# Patient Record
Sex: Male | Born: 1970
Health system: Southern US, Community
[De-identification: ages and names within clinical notes are randomized; demographics above are authoritative.]

## PROBLEM LIST (undated history)

## (undated) DIAGNOSIS — E66811 Obesity, class 1: Secondary | ICD-10-CM

## (undated) DIAGNOSIS — N529 Male erectile dysfunction, unspecified: Secondary | ICD-10-CM

## (undated) DIAGNOSIS — Z9989 Dependence on other enabling machines and devices: Secondary | ICD-10-CM

## (undated) DIAGNOSIS — G8929 Other chronic pain: Secondary | ICD-10-CM

## (undated) DIAGNOSIS — M109 Gout, unspecified: Secondary | ICD-10-CM

## (undated) DIAGNOSIS — J301 Allergic rhinitis due to pollen: Secondary | ICD-10-CM

## (undated) DIAGNOSIS — M1990 Unspecified osteoarthritis, unspecified site: Secondary | ICD-10-CM

## (undated) DIAGNOSIS — I2510 Atherosclerotic heart disease of native coronary artery without angina pectoris: Secondary | ICD-10-CM

## (undated) DIAGNOSIS — G4730 Sleep apnea, unspecified: Secondary | ICD-10-CM

## (undated) DIAGNOSIS — I129 Hypertensive chronic kidney disease with stage 1 through stage 4 chronic kidney disease, or unspecified chronic kidney disease: Secondary | ICD-10-CM

## (undated) DIAGNOSIS — R42 Dizziness and giddiness: Secondary | ICD-10-CM

## (undated) DIAGNOSIS — G4733 Obstructive sleep apnea (adult) (pediatric): Secondary | ICD-10-CM

## (undated) DIAGNOSIS — E669 Obesity, unspecified: Secondary | ICD-10-CM

## (undated) DIAGNOSIS — I10 Essential (primary) hypertension: Secondary | ICD-10-CM

## (undated) DIAGNOSIS — I219 Acute myocardial infarction, unspecified: Secondary | ICD-10-CM

## (undated) DIAGNOSIS — E785 Hyperlipidemia, unspecified: Secondary | ICD-10-CM

## (undated) DIAGNOSIS — M25512 Pain in left shoulder: Secondary | ICD-10-CM

## (undated) HISTORY — PX: HERNIA REPAIR: SHX51

## (undated) HISTORY — DX: Hypertensive chronic kidney disease with stage 1 through stage 4 chronic kidney disease, or unspecified chronic kidney disease: I12.9

## (undated) HISTORY — PX: NASAL SINUS SURGERY: SHX719

## (undated) HISTORY — DX: Gout, unspecified: M10.9

## (undated) HISTORY — DX: Other chronic pain: G89.29

## (undated) HISTORY — DX: Hyperlipidemia, unspecified: E78.5

## (undated) HISTORY — DX: Male erectile dysfunction, unspecified: N52.9

## (undated) HISTORY — DX: Dependence on other enabling machines and devices: Z99.89

## (undated) HISTORY — DX: Obesity, class 1: E66.811

## (undated) HISTORY — DX: Allergic rhinitis due to pollen: J30.1

## (undated) HISTORY — DX: Pain in left shoulder: M25.512

## (undated) HISTORY — DX: Obstructive sleep apnea (adult) (pediatric): G47.33

## (undated) HISTORY — DX: Obesity, unspecified: E66.9

## (undated) HISTORY — DX: Dizziness and giddiness: R42

---

## 2004-01-27 ENCOUNTER — Encounter: Admission: RE | Admit: 2004-01-27 | Discharge: 2004-01-27 | Payer: Self-pay | Admitting: Family Medicine

## 2005-01-12 ENCOUNTER — Encounter: Admission: RE | Admit: 2005-01-12 | Discharge: 2005-01-12 | Payer: Self-pay | Admitting: Sports Medicine

## 2006-04-06 ENCOUNTER — Encounter: Admission: RE | Admit: 2006-04-06 | Discharge: 2006-04-06 | Payer: Self-pay | Admitting: Family Medicine

## 2009-03-13 HISTORY — PX: CORONARY STENT PLACEMENT: SHX1402

## 2009-11-14 DIAGNOSIS — I214 Non-ST elevation (NSTEMI) myocardial infarction: Secondary | ICD-10-CM

## 2009-11-14 HISTORY — DX: Non-ST elevation (NSTEMI) myocardial infarction: I21.4

## 2009-11-15 ENCOUNTER — Inpatient Hospital Stay (HOSPITAL_COMMUNITY): Admission: EM | Admit: 2009-11-15 | Discharge: 2009-11-18 | Payer: Self-pay | Admitting: Emergency Medicine

## 2009-11-15 ENCOUNTER — Ambulatory Visit: Payer: Self-pay | Admitting: Cardiovascular Disease

## 2009-11-15 DIAGNOSIS — I2510 Atherosclerotic heart disease of native coronary artery without angina pectoris: Secondary | ICD-10-CM

## 2009-11-15 HISTORY — PX: CORONARY STENT PLACEMENT: SHX1402

## 2009-11-15 HISTORY — PX: LEFT HEART CATH AND CORONARY ANGIOGRAPHY: CATH118249

## 2009-11-15 HISTORY — DX: Atherosclerotic heart disease of native coronary artery without angina pectoris: I25.10

## 2009-11-30 DIAGNOSIS — I2129 ST elevation (STEMI) myocardial infarction involving other sites: Secondary | ICD-10-CM | POA: Insufficient documentation

## 2009-12-30 ENCOUNTER — Encounter (HOSPITAL_COMMUNITY)
Admission: RE | Admit: 2009-12-30 | Discharge: 2010-02-18 | Payer: Self-pay | Source: Home / Self Care | Attending: Cardiology | Admitting: Cardiology

## 2010-05-26 LAB — HEMOGLOBIN A1C
Hgb A1c MFr Bld: 4 % — ABNORMAL LOW (ref <5.7)
Hgb A1c MFr Bld: 4.1 % — ABNORMAL LOW (ref <5.7)
Mean Plasma Glucose: 68 mg/dL (ref <117)
Mean Plasma Glucose: 71 mg/dL (ref <117)

## 2010-05-26 LAB — PLATELET COUNT: Platelets: 278 10*3/uL (ref 150–400)

## 2010-05-26 LAB — CBC
HCT: 32.4 % — ABNORMAL LOW (ref 39.0–52.0)
HCT: 34 % — ABNORMAL LOW (ref 39.0–52.0)
HCT: 34.5 % — ABNORMAL LOW (ref 39.0–52.0)
Hemoglobin: 11.7 g/dL — ABNORMAL LOW (ref 13.0–17.0)
Hemoglobin: 12.4 g/dL — ABNORMAL LOW (ref 13.0–17.0)
Hemoglobin: 12.5 g/dL — ABNORMAL LOW (ref 13.0–17.0)
MCH: 29.5 pg (ref 26.0–34.0)
MCH: 29.6 pg (ref 26.0–34.0)
MCH: 29.7 pg (ref 26.0–34.0)
MCHC: 36.1 g/dL — ABNORMAL HIGH (ref 30.0–36.0)
MCHC: 36.2 g/dL — ABNORMAL HIGH (ref 30.0–36.0)
MCHC: 36.5 g/dL — ABNORMAL HIGH (ref 30.0–36.0)
MCV: 81.3 fL (ref 78.0–100.0)
MCV: 81.4 fL (ref 78.0–100.0)
MCV: 82 fL (ref 78.0–100.0)
Platelets: 258 10*3/uL (ref 150–400)
Platelets: 288 10*3/uL (ref 150–400)
Platelets: 307 10*3/uL (ref 150–400)
RBC: 3.95 MIL/uL — ABNORMAL LOW (ref 4.22–5.81)
RBC: 4.18 MIL/uL — ABNORMAL LOW (ref 4.22–5.81)
RBC: 4.24 MIL/uL (ref 4.22–5.81)
RDW: 16.6 % — ABNORMAL HIGH (ref 11.5–15.5)
RDW: 16.8 % — ABNORMAL HIGH (ref 11.5–15.5)
RDW: 16.8 % — ABNORMAL HIGH (ref 11.5–15.5)
WBC: 13.6 10*3/uL — ABNORMAL HIGH (ref 4.0–10.5)
WBC: 14.9 10*3/uL — ABNORMAL HIGH (ref 4.0–10.5)
WBC: 18.3 10*3/uL — ABNORMAL HIGH (ref 4.0–10.5)

## 2010-05-26 LAB — CK TOTAL AND CKMB (NOT AT ARMC)
CK, MB: 13.8 ng/mL (ref 0.3–4.0)
CK, MB: 70.6 ng/mL (ref 0.3–4.0)
Relative Index: 6.4 — ABNORMAL HIGH (ref 0.0–2.5)
Relative Index: 9.2 — ABNORMAL HIGH (ref 0.0–2.5)
Total CK: 215 U/L (ref 7–232)
Total CK: 764 U/L — ABNORMAL HIGH (ref 7–232)

## 2010-05-26 LAB — HEPATIC FUNCTION PANEL
ALT: 26 U/L (ref 0–53)
AST: 34 U/L (ref 0–37)
Albumin: 4.4 g/dL (ref 3.5–5.2)
Alkaline Phosphatase: 51 U/L (ref 39–117)
Bilirubin, Direct: 0.2 mg/dL (ref 0.0–0.3)
Indirect Bilirubin: 1.2 mg/dL — ABNORMAL HIGH (ref 0.3–0.9)
Total Bilirubin: 1.4 mg/dL — ABNORMAL HIGH (ref 0.3–1.2)
Total Protein: 7.4 g/dL (ref 6.0–8.3)

## 2010-05-26 LAB — COMPREHENSIVE METABOLIC PANEL
ALT: 28 U/L (ref 0–53)
AST: 33 U/L (ref 0–37)
Albumin: 4.6 g/dL (ref 3.5–5.2)
Alkaline Phosphatase: 49 U/L (ref 39–117)
BUN: 13 mg/dL (ref 6–23)
CO2: 25 mEq/L (ref 19–32)
Calcium: 9.6 mg/dL (ref 8.4–10.5)
Chloride: 105 mEq/L (ref 96–112)
Creatinine, Ser: 0.91 mg/dL (ref 0.4–1.5)
GFR calc Af Amer: >60 mL/min (ref >60)
GFR calc non Af Amer: >60 mL/min (ref >60)
Glucose, Bld: 121 mg/dL — ABNORMAL HIGH (ref 70–99)
Potassium: 4 mEq/L (ref 3.5–5.1)
Sodium: 141 mEq/L (ref 135–145)
Total Bilirubin: 1.2 mg/dL (ref 0.3–1.2)
Total Protein: 7.6 g/dL (ref 6.0–8.3)

## 2010-05-26 LAB — BASIC METABOLIC PANEL
BUN: 13 mg/dL (ref 6–23)
BUN: 9 mg/dL (ref 6–23)
CO2: 27 mEq/L (ref 19–32)
CO2: 29 mEq/L (ref 19–32)
Calcium: 9.2 mg/dL (ref 8.4–10.5)
Calcium: 9.2 mg/dL (ref 8.4–10.5)
Chloride: 102 mEq/L (ref 96–112)
Chloride: 105 mEq/L (ref 96–112)
Creatinine, Ser: 1.07 mg/dL (ref 0.4–1.5)
Creatinine, Ser: 1.14 mg/dL (ref 0.4–1.5)
GFR calc Af Amer: >60 mL/min (ref >60)
GFR calc Af Amer: >60 mL/min (ref >60)
GFR calc non Af Amer: >60 mL/min (ref >60)
GFR calc non Af Amer: >60 mL/min (ref >60)
Glucose, Bld: 118 mg/dL — ABNORMAL HIGH (ref 70–99)
Glucose, Bld: 120 mg/dL — ABNORMAL HIGH (ref 70–99)
Potassium: 3.8 mEq/L (ref 3.5–5.1)
Potassium: 3.9 mEq/L (ref 3.5–5.1)
Sodium: 138 mEq/L (ref 135–145)
Sodium: 139 mEq/L (ref 135–145)

## 2010-05-26 LAB — DIFFERENTIAL
Basophils Absolute: 0.2 10*3/uL — ABNORMAL HIGH (ref 0.0–0.1)
Basophils Relative: 2 % — ABNORMAL HIGH (ref 0–1)
Eosinophils Absolute: 0.1 10*3/uL (ref 0.0–0.7)
Eosinophils Relative: 1 % (ref 0–5)
Lymphocytes Relative: 21 % (ref 12–46)
Lymphs Abs: 2.9 10*3/uL (ref 0.7–4.0)
Monocytes Absolute: 1.7 10*3/uL — ABNORMAL HIGH (ref 0.1–1.0)
Monocytes Relative: 12 % (ref 3–12)
Neutro Abs: 9.1 10*3/uL — ABNORMAL HIGH (ref 1.7–7.7)
Neutrophils Relative %: 65 % (ref 43–77)

## 2010-05-26 LAB — CULTURE, BLOOD (ROUTINE X 2): Culture: NO GROWTH

## 2010-05-26 LAB — MRSA PCR SCREENING
MRSA by PCR: NEGATIVE
MRSA by PCR: NEGATIVE

## 2010-05-26 LAB — LIPID PANEL
Cholesterol: 157 mg/dL (ref 0–200)
HDL: 41 mg/dL (ref >39)
LDL Cholesterol: 75 mg/dL (ref 0–99)
Total CHOL/HDL Ratio: 3.8 RATIO
Triglycerides: 207 mg/dL — ABNORMAL HIGH (ref <150)
VLDL: 41 mg/dL — ABNORMAL HIGH (ref 0–40)

## 2010-05-26 LAB — PROTIME-INR
INR: 1.06 (ref 0.00–1.49)
Prothrombin Time: 14 seconds (ref 11.6–15.2)

## 2010-05-26 LAB — POCT CARDIAC MARKERS
CKMB, poc: 3.1 ng/mL (ref 1.0–8.0)
CKMB, poc: 3.2 ng/mL (ref 1.0–8.0)
Myoglobin, poc: 101 ng/mL (ref 12–200)
Myoglobin, poc: 99 ng/mL (ref 12–200)
Troponin i, poc: 0.15 ng/mL — ABNORMAL HIGH (ref 0.00–0.09)
Troponin i, poc: 0.19 ng/mL — ABNORMAL HIGH (ref 0.00–0.09)

## 2010-05-26 LAB — TROPONIN I
Troponin I: 1.06 ng/mL (ref 0.00–0.06)
Troponin I: 20.96 ng/mL (ref 0.00–0.06)

## 2010-05-26 LAB — LIPASE, BLOOD: Lipase: 42 U/L (ref 11–59)

## 2010-05-26 LAB — D-DIMER, QUANTITATIVE: D-Dimer, Quant: 0.66 ug/mL-FEU — ABNORMAL HIGH (ref 0.00–0.48)

## 2010-05-26 LAB — TSH: TSH: 0.809 u[IU]/mL (ref 0.350–4.500)

## 2010-11-18 ENCOUNTER — Other Ambulatory Visit: Payer: Self-pay | Admitting: Cardiology

## 2010-11-18 ENCOUNTER — Ambulatory Visit
Admission: RE | Admit: 2010-11-18 | Discharge: 2010-11-18 | Disposition: A | Discharge status: Home / Self Care | Payer: BC Managed Care – PPO | Source: Ambulatory Visit | Attending: Cardiology | Admitting: Cardiology

## 2010-11-18 DIAGNOSIS — R0789 Other chest pain: Secondary | ICD-10-CM

## 2010-11-21 ENCOUNTER — Inpatient Hospital Stay (HOSPITAL_BASED_OUTPATIENT_CLINIC_OR_DEPARTMENT_OTHER)
Admission: RE | Admit: 2010-11-21 | Discharge: 2010-11-21 | Disposition: A | Discharge status: Home / Self Care | Payer: BC Managed Care – PPO | Source: Ambulatory Visit | Attending: Cardiology | Admitting: Cardiology

## 2010-11-21 DIAGNOSIS — R0789 Other chest pain: Secondary | ICD-10-CM | POA: Insufficient documentation

## 2010-11-21 DIAGNOSIS — Z9861 Coronary angioplasty status: Secondary | ICD-10-CM | POA: Insufficient documentation

## 2010-11-21 DIAGNOSIS — I2510 Atherosclerotic heart disease of native coronary artery without angina pectoris: Secondary | ICD-10-CM | POA: Insufficient documentation

## 2010-11-21 NOTE — Cardiovascular Report (Signed)
  NAMEFINNIE, FABBRI NO.:  [id number]  MEDICAL RECORD NO.:  [id number]  LOCATION:                                 FACILITY:  PHYSICIAN:  Ezzard Standing, M.D.DATE OF BIRTH:  18-Aug-1970  DATE OF PROCEDURE:  11/21/2010                           CARDIAC CATHETERIZATION   HISTORY:  A 40 year old black male who a year ago had an acute lateral myocardial infarction with stenting of the diagonal.  He developed some left-sided chest pain that was atypical but had new T-wave changes in the inferior and lateral leads not seen on a previous EKG here. Catheterization was advised to exclude progression of coronary artery disease.  PROCEDURE:  Left heart catheterization with coronary angiograms and left ventriculogram.  COMMENTS ABOUT PROCEDURE:  The patient was brought to the cardiac cath lab where he was prepped and draped in the usual manner.  After Xylocaine anesthesia, a 4-French sheath was placed in the right femoral artery percutaneously and angiograms were made using 4-French catheters. A 30 mL ventriculogram was performed and then sheath was removed on the table and into the holding area.  He tolerated the procedure well.  HEMODYNAMIC DATA:  Aorta postcontrast 105/56, LV postcontrast 105/11-14.  ANGIOGRAPHIC DATA:  Left ventriculogram:  Performed in the 30-degree RAO projection.  The aortic valve is normal.  Mitral valve is normal.  Left ventricle is normal in size.  There is a mild area of high lateral hypokinesis present.  The ejection fraction is estimated at 60%. Coronary arteries arise and distribute normally.  There is no coronary calcification noted.  Left main coronary artery is normal.  Left anterior descending extends to the apex.  The diagonal branch is widely patent with a widely patent stent site.  The left anterior descending has a mild eccentric 20% stenosis following the diagonal branch and scattered irregularities elsewhere.  The circumflex  coronary artery is a codominant vessel.  There is a large marginal branch that is mildly tortuous and free of disease.  There are two posterolateral branches and one appears to be a twin posterior descending artery which are free of disease.  The right coronary artery is a codominant vessel and ends in a posterior descending artery contains only mild  irregularities.  IMPRESSION: 1. Scattered irregularities with mild coronary artery disease. 2. Widely patent stent site in the diagonal branch. 3. Preserved left ventricular function with a mild area of high     lateral hypokinesis.     Ezzard Standing, M.D.     WST/MEDQ  D:  11/21/2010  T:  11/21/2010  Job:  [id number]  cc:   Teodoro Kil. Jonny Ruiz, M.D.  Electronically Signed by Viona Gilmore. Wynonia Lawman M.D. on 11/21/2010 03:54:11 PM

## 2010-11-22 HISTORY — PX: LEFT HEART CATH AND CORONARY ANGIOGRAPHY: CATH118249

## 2011-12-07 ENCOUNTER — Other Ambulatory Visit: Payer: Self-pay | Admitting: Family Medicine

## 2011-12-07 ENCOUNTER — Ambulatory Visit
Admission: RE | Admit: 2011-12-07 | Discharge: 2011-12-07 | Disposition: A | Discharge status: Home / Self Care | Payer: BC Managed Care – PPO | Source: Ambulatory Visit | Attending: Family Medicine | Admitting: Family Medicine

## 2011-12-07 DIAGNOSIS — R05 Cough: Secondary | ICD-10-CM

## 2011-12-07 DIAGNOSIS — R059 Cough, unspecified: Secondary | ICD-10-CM

## 2013-07-01 ENCOUNTER — Other Ambulatory Visit: Payer: Self-pay | Admitting: Family Medicine

## 2013-07-01 ENCOUNTER — Ambulatory Visit
Admission: RE | Admit: 2013-07-01 | Discharge: 2013-07-01 | Disposition: A | Discharge status: Home / Self Care | Payer: BC Managed Care – PPO | Source: Ambulatory Visit | Attending: Family Medicine | Admitting: Family Medicine

## 2013-07-01 DIAGNOSIS — M545 Low back pain, unspecified: Secondary | ICD-10-CM

## 2013-08-08 ENCOUNTER — Encounter (HOSPITAL_BASED_OUTPATIENT_CLINIC_OR_DEPARTMENT_OTHER): Payer: BC Managed Care – PPO

## 2013-09-19 ENCOUNTER — Ambulatory Visit (HOSPITAL_BASED_OUTPATIENT_CLINIC_OR_DEPARTMENT_OTHER): Payer: BC Managed Care – PPO | Attending: Family Medicine | Admitting: Radiology

## 2013-09-19 VITALS — Ht 72.0 in | Wt 250.0 lb

## 2013-09-19 DIAGNOSIS — G4733 Obstructive sleep apnea (adult) (pediatric): Secondary | ICD-10-CM | POA: Insufficient documentation

## 2013-09-19 DIAGNOSIS — R0683 Snoring: Secondary | ICD-10-CM

## 2013-09-19 DIAGNOSIS — R0609 Other forms of dyspnea: Secondary | ICD-10-CM | POA: Insufficient documentation

## 2013-09-19 DIAGNOSIS — G4730 Sleep apnea, unspecified: Secondary | ICD-10-CM

## 2013-09-19 DIAGNOSIS — R0989 Other specified symptoms and signs involving the circulatory and respiratory systems: Secondary | ICD-10-CM | POA: Insufficient documentation

## 2013-09-20 DIAGNOSIS — R0609 Other forms of dyspnea: Secondary | ICD-10-CM

## 2013-09-20 DIAGNOSIS — G4730 Sleep apnea, unspecified: Secondary | ICD-10-CM

## 2013-09-20 DIAGNOSIS — R0989 Other specified symptoms and signs involving the circulatory and respiratory systems: Secondary | ICD-10-CM

## 2013-09-20 NOTE — Sleep Study (Signed)
   NAME: George Erickson DATE OF BIRTH:  [birth year] MEDICAL RECORD NUMBER [id number]  LOCATION: [facility]  PHYSICIAN: Shermeka Rutt D  DATE OF STUDY: 09/19/2013  SLEEP STUDY TYPE: Nocturnal Polysomnogram               REFERRING PHYSICIAN: Jonathon Bellows, MD  INDICATION FOR STUDY:  hypersomnia with sleep apnea  EPWORTH SLEEPINESS SCORE:   20/24 HEIGHT: 6' (182.9 cm)  WEIGHT: 250 lb (113.399 kg)    Body mass index is 33.9 kg/(m^2).  NECK SIZE: 18.5 in.  MEDICATIONS: Charted for review  SLEEP ARCHITECTURE: Sleep study protocol. During the diagnostic phase, total sleep time 123.5 minutes with sleep efficiency 90.1%. Stage I was 4%, stage II 96%, stage is 3 and REM were absent. Sleep latency 12 minutes, awake after sleep onset 2 minutes, arousal index 13.6, bedtime medication: None  RESPIRATORY DATA: Apnea hypopneas index (AHI) 17 per hour. 35 total events scored including 12 obstructive apneas and 23 hypopneas. Events were seen in all positions, especially while supine. REM AHI 0 CPAP was titrated to 9 CWP, AHI 0 per hour. He wore a large fullface mask  OXYGEN DATA: Moderately loud snoring before CPAP with oxygen desaturation to a nadir of 80%. With CPAP control snoring was prevented and mean oxygen saturation held 95.1% on room air.  CARDIAC DATA: Sinus rhythm  MOVEMENT/PARASOMNIA: No significant movement disturbance, bathroom x1  IMPRESSION/ RECOMMENDATION:   1) Moderate obstructive sleep apnea/hypopneas syndrome, AHI 17 per hour with non-positional events. Moderately loud snoring with oxygen desaturation to a nadir of 88% on room air. 2) Successful CPAP titration to 9 CWP, AHI 0 per hour. He wore a large ResMed AirFit mask with heated humidifier. Snoring was prevented and mean oxygen saturation held at 95.1% on room air. The technician commented that the patient had a very hard time getting used to CPAP. He may benefit from a sleep medication initially.    Signed Baird Lyons M.D. Deneise Lever Diplomate, American Board of Sleep Medicine  ELECTRONICALLY SIGNED ON:  09/20/2013, 1:23 PM [place] PH: [phone]   FX: [phone] [place]

## 2013-12-11 HISTORY — PX: OTHER SURGICAL HISTORY: SHX169

## 2013-12-31 HISTORY — PX: LEFT HEART CATH AND CORONARY ANGIOGRAPHY: CATH118249

## 2014-01-12 ENCOUNTER — Encounter: Payer: Self-pay | Admitting: Cardiology

## 2014-01-12 NOTE — Progress Notes (Signed)
Patient ID: George Erickson, male   DOB: 07-01-1970, 43 y.o.   MRN: [id number]   Rennick, [place]allah  Date of visit:  01/12/2014 DOB:  09-Feb-1971    Age:  43 yrs. Medical record number:  [id number]     Account number:  [id number] Primary Care Provider: Cephas Small ____________________________ CURRENT DIAGNOSES  1. Atherosclerotic heart disease of native coronary artery without angina pectoris  2. Hypertensive heart disease without heart failure  3. Hyperlipidemia, unspecified  4. Old myocardial infarction  5. Presence of coronary angioplasty implant and graft  6. Sleep apnea  7. Obesity ____________________________ ALLERGIES  Niacin, Rash, itching ____________________________ MEDICATIONS  1. metoprolol succinate 50 mg tablet extended release 24 hr, 1 p.o. daily  2. aspirin 81 mg chewable tablet, 1 p.o. daily  3. nitroglycerin 0.4 mg sublingual tablet, PRN  4. Singulair 10 mg tablet, 1 p.o. daily  5. Flonase Allergy Relief 50 mcg/actuation nasal spray,suspension, qd  6. Claritin 10 mg tablet, 1 p.o. daily  7. indomethacin 50 mg capsule, PRN  8. atorvastatin 40 mg tablet, 1 p.o. daily  9. lisinopril 10 mg-hydrochlorothiazide 12.5 mg tablet, 1 p.o. daily ____________________________ CHIEF COMPLAINTS  Recent  er at [facility]  Told abd ekg had cardiolite  had cath told all ok ____________________________ HISTORY OF PRESENT ILLNESS  Patient returns for cardiac followup. He was at work and had an episode of tachycardia and elevated blood pressure and was hospitalized in [place]. He eventually had a nuclear perfusion scan that was equivocal and underwent cardiac catheterization about 2 weeks ago that did not show significant obstruction. He was discharged home. He is getting ready to go hunting. He has not been able to lose any weight since last year and continues to have a fair bit of back pain. He is not currently having angina and denies PND, orthopnea or  claudication. ____________________________ PAST HISTORY  Past Medical Illnesses:  hypertension, hyperlipidemia, sleep apnea, lumbar disc disease, obesity;  Cardiovascular Illnesses:  S/P MI-lateral September 2011, CAD;  Surgical Procedures:  hernia repair, sinus surgery;  Cardiology Procedures-Invasive:  Promus stent diagonal September 2011, cardiac cath (left) October 2015;  Cardiology Procedures-Noninvasive:  treadmill cardiolite October 2015;  Cardiac Cath Results:  normal Left main, 40% stenosis mid LAD, patent stent diagonal, scattered irregularities CFX, 20 % stenosis proximal RCA;  LVEF of 60% documented via cardiac cath on 11/21/2010,   ____________________________ CARDIO-PULMONARY TEST DATES EKG Date:  02/04/2013;   Cardiac Cath Date:  12/31/2013;  Stent Placement Date: 11/15/2009;  Nuclear Study Date:  12/30/2013;  Echocardiography Date: 04/13/2010;  Chest Xray Date: 12/29/2013;   ____________________________ FAMILY HISTORY Brother -- Brother alive and well Brother -- Brother alive and well Father -- Father dead, Congestive heart failure Mother -- Mother alive with problem, Coronary Artery Disease Sister -- Sister alive and well Sister -- Sister alive with problem, Hypertension, Hypercholesterolemia Sister -- Sister alive with problem, Coronary Artery Disease ____________________________ SOCIAL HISTORY Alcohol Use:  socially;  Smoking:  never smoked;  Diet:  [employer];  Lifestyle:  married;  Exercise:  walking for approximately 10 minutes 7 days per week;  Occupation:  Environmental education officer and company;  Residence:  lives with wife and children;   ____________________________ REVIEW OF SYSTEMS General:  feels well, no change in exercise tolerance. Eyes: wears eye glasses/contact lenses Respiratory: denies dyspnea, cough, wheezing or hemoptysis. Cardiovascular:  please review HPI  Genitourinary-Male: no dysuria, urgency, frequency, or nocturia  Musculoskeletal:  arthritis of the  neck, chronic  low back pain  ____________________________ PHYSICAL EXAMINATION VITAL SIGNS  Blood Pressure:  114/70 Sitting, Right arm, large cuff  , 122/78 Standing, Right arm and large cuff   Pulse:  76/min. Weight:  242.00 lbs. Height:  72"BMI: 33  Constitutional:  pleasant African Americian male in no acute distress Skin:  warm and dry to touch, no apparent skin lesions, or masses noted. Head:  normocephalic, normal hair pattern, no masses or tenderness Neck:  supple, without massess. No JVD, thyromegaly or carotid bruits. Carotid upstroke normal. Chest:  normal symmetry, clear to auscultation and percussion. Cardiac:  regular rhythm, normal S1 and S2, No S3 or S4, no murmurs, gallops or rubs detected. Peripheral Pulses:  the femoral,dorsalis pedis, and posterior tibial pulses are full and equal bilaterally with no bruits auscultated. Extremities & Back:  no deformities, clubbing, cyanosis, erythema or edema observed. Normal muscle strength and tone. Neurological:  no gross motor or sensory deficits noted, affect appropriate, oriented x3. ____________________________ MOST RECENT LIPID PANEL 12/30/13  CHOL TOTL 114 mg/dl, LDL 26 NM, HDL 29 mg/dl and TRIGLYCER 299 mg/dl ____________________________ IMPRESSIONS/PLAN 1. Recent hospitalization with tachycardia and elevated blood pressure of uncertain cause ultimately requiring catheterization that did not show obstructive disease 2. Coronary artery disease with previous lateral wall infarction 3. Obesity with need to lose weight again discussed 4. Hyperlipidemia under treatment 5. Sleep apnea stable  Recommendations:  Discussed importance of weight loss. Refill was sent in for Lipitor. Followup in one year. ____________________________ TODAYS ORDERS  1. Return Visit: 1 year  2. 12 Lead EKG: 1 year                       ____________________________ Cardiology Physician:  Kerry Hough MD [facility]

## 2014-07-06 ENCOUNTER — Other Ambulatory Visit: Payer: Self-pay | Admitting: Family Medicine

## 2014-07-06 DIAGNOSIS — M5430 Sciatica, unspecified side: Secondary | ICD-10-CM

## 2014-07-07 ENCOUNTER — Inpatient Hospital Stay (HOSPITAL_COMMUNITY)
Admission: EM | Admit: 2014-07-07 | Discharge: 2014-07-14 | DRG: 194 | Disposition: A | Discharge status: Home / Self Care | Payer: BLUE CROSS/BLUE SHIELD | Attending: Internal Medicine | Admitting: Internal Medicine

## 2014-07-07 ENCOUNTER — Encounter (HOSPITAL_COMMUNITY): Payer: Self-pay

## 2014-07-07 ENCOUNTER — Emergency Department (HOSPITAL_COMMUNITY): Payer: BLUE CROSS/BLUE SHIELD

## 2014-07-07 DIAGNOSIS — R5081 Fever presenting with conditions classified elsewhere: Secondary | ICD-10-CM | POA: Diagnosis not present

## 2014-07-07 DIAGNOSIS — I10 Essential (primary) hypertension: Secondary | ICD-10-CM | POA: Diagnosis present

## 2014-07-07 DIAGNOSIS — E785 Hyperlipidemia, unspecified: Secondary | ICD-10-CM | POA: Diagnosis present

## 2014-07-07 DIAGNOSIS — G4733 Obstructive sleep apnea (adult) (pediatric): Secondary | ICD-10-CM | POA: Diagnosis present

## 2014-07-07 DIAGNOSIS — M1000 Idiopathic gout, unspecified site: Secondary | ICD-10-CM | POA: Insufficient documentation

## 2014-07-07 DIAGNOSIS — R739 Hyperglycemia, unspecified: Secondary | ICD-10-CM | POA: Diagnosis present

## 2014-07-07 DIAGNOSIS — R1310 Dysphagia, unspecified: Secondary | ICD-10-CM

## 2014-07-07 DIAGNOSIS — Z955 Presence of coronary angioplasty implant and graft: Secondary | ICD-10-CM | POA: Diagnosis not present

## 2014-07-07 DIAGNOSIS — R05 Cough: Secondary | ICD-10-CM | POA: Insufficient documentation

## 2014-07-07 DIAGNOSIS — I252 Old myocardial infarction: Secondary | ICD-10-CM | POA: Diagnosis not present

## 2014-07-07 DIAGNOSIS — Z791 Long term (current) use of non-steroidal anti-inflammatories (NSAID): Secondary | ICD-10-CM | POA: Diagnosis not present

## 2014-07-07 DIAGNOSIS — M25512 Pain in left shoulder: Secondary | ICD-10-CM

## 2014-07-07 DIAGNOSIS — Z7982 Long term (current) use of aspirin: Secondary | ICD-10-CM | POA: Diagnosis not present

## 2014-07-07 DIAGNOSIS — R071 Chest pain on breathing: Secondary | ICD-10-CM

## 2014-07-07 DIAGNOSIS — M10061 Idiopathic gout, right knee: Secondary | ICD-10-CM | POA: Diagnosis present

## 2014-07-07 DIAGNOSIS — E876 Hypokalemia: Secondary | ICD-10-CM | POA: Diagnosis present

## 2014-07-07 DIAGNOSIS — J189 Pneumonia, unspecified organism: Secondary | ICD-10-CM | POA: Diagnosis present

## 2014-07-07 DIAGNOSIS — R059 Cough, unspecified: Secondary | ICD-10-CM

## 2014-07-07 DIAGNOSIS — R079 Chest pain, unspecified: Secondary | ICD-10-CM | POA: Diagnosis present

## 2014-07-07 DIAGNOSIS — R0781 Pleurodynia: Secondary | ICD-10-CM | POA: Diagnosis not present

## 2014-07-07 DIAGNOSIS — G8929 Other chronic pain: Secondary | ICD-10-CM | POA: Diagnosis present

## 2014-07-07 DIAGNOSIS — D72829 Elevated white blood cell count, unspecified: Secondary | ICD-10-CM | POA: Insufficient documentation

## 2014-07-07 DIAGNOSIS — D649 Anemia, unspecified: Secondary | ICD-10-CM | POA: Diagnosis present

## 2014-07-07 DIAGNOSIS — I2510 Atherosclerotic heart disease of native coronary artery without angina pectoris: Secondary | ICD-10-CM | POA: Diagnosis present

## 2014-07-07 DIAGNOSIS — R0902 Hypoxemia: Secondary | ICD-10-CM | POA: Diagnosis not present

## 2014-07-07 DIAGNOSIS — M25561 Pain in right knee: Secondary | ICD-10-CM

## 2014-07-07 DIAGNOSIS — J90 Pleural effusion, not elsewhere classified: Secondary | ICD-10-CM | POA: Diagnosis present

## 2014-07-07 DIAGNOSIS — N179 Acute kidney failure, unspecified: Secondary | ICD-10-CM | POA: Diagnosis not present

## 2014-07-07 DIAGNOSIS — M109 Gout, unspecified: Secondary | ICD-10-CM | POA: Diagnosis not present

## 2014-07-07 HISTORY — DX: Essential (primary) hypertension: I10

## 2014-07-07 HISTORY — DX: Atherosclerotic heart disease of native coronary artery without angina pectoris: I25.10

## 2014-07-07 HISTORY — DX: Sleep apnea, unspecified: G47.30

## 2014-07-07 HISTORY — DX: Acute myocardial infarction, unspecified: I21.9

## 2014-07-07 HISTORY — DX: Unspecified osteoarthritis, unspecified site: M19.90

## 2014-07-07 LAB — CBC
HCT: 35.1 % — ABNORMAL LOW (ref 39.0–52.0)
Hemoglobin: 12.5 g/dL — ABNORMAL LOW (ref 13.0–17.0)
MCH: 29.2 pg (ref 26.0–34.0)
MCHC: 35.6 g/dL (ref 30.0–36.0)
MCV: 82 fL (ref 78.0–100.0)
Platelets: 356 10*3/uL (ref 150–400)
RBC: 4.28 MIL/uL (ref 4.22–5.81)
RDW: 16.6 % — ABNORMAL HIGH (ref 11.5–15.5)
WBC: 23.8 10*3/uL — ABNORMAL HIGH (ref 4.0–10.5)

## 2014-07-07 LAB — INFLUENZA PANEL BY PCR (TYPE A & B)
H1N1 flu by pcr: NOT DETECTED
Influenza A By PCR: NEGATIVE
Influenza B By PCR: NEGATIVE

## 2014-07-07 LAB — BASIC METABOLIC PANEL
Anion gap: 10 (ref 5–15)
BUN: 9 mg/dL (ref 6–23)
CO2: 25 mmol/L (ref 19–32)
Calcium: 9.1 mg/dL (ref 8.4–10.5)
Chloride: 105 mmol/L (ref 96–112)
Creatinine, Ser: 1.17 mg/dL (ref 0.50–1.35)
GFR calc Af Amer: 86 mL/min — ABNORMAL LOW (ref >90)
GFR calc non Af Amer: 74 mL/min — ABNORMAL LOW (ref >90)
Glucose, Bld: 157 mg/dL — ABNORMAL HIGH (ref 70–99)
Potassium: 3.4 mmol/L — ABNORMAL LOW (ref 3.5–5.1)
Sodium: 140 mmol/L (ref 135–145)

## 2014-07-07 LAB — MAGNESIUM: Magnesium: 1.8 mg/dL (ref 1.5–2.5)

## 2014-07-07 LAB — STREP PNEUMONIAE URINARY ANTIGEN: Strep Pneumo Urinary Antigen: NEGATIVE

## 2014-07-07 LAB — I-STAT TROPONIN, ED: Troponin i, poc: 0 ng/mL (ref 0.00–0.08)

## 2014-07-07 LAB — TROPONIN I
Troponin I: <0.03 ng/mL (ref <0.031)
Troponin I: <0.03 ng/mL (ref <0.031)

## 2014-07-07 LAB — TSH: TSH: 0.259 u[IU]/mL — ABNORMAL LOW (ref 0.350–4.500)

## 2014-07-07 MED ORDER — ACETAMINOPHEN 650 MG RE SUPP: 650.0000 mg | Freq: Four times a day (QID) | RECTAL | Status: DC | PRN

## 2014-07-07 MED ORDER — MORPHINE SULFATE 2 MG/ML IJ SOLN
1.0000 mg | INTRAMUSCULAR | Status: DC | PRN
  Administered 2014-07-08 – 2014-07-09 (×2): 1 mg via INTRAVENOUS
  Filled 2014-07-07 (×2): qty 1

## 2014-07-07 MED ORDER — DEXTROSE 5 % IV SOLN
1.0000 g | Freq: Once | INTRAVENOUS | Status: AC
  Administered 2014-07-07: 1 g via INTRAVENOUS
  Filled 2014-07-07: qty 10

## 2014-07-07 MED ORDER — DEXTROSE 5 % IV SOLN
500.0000 mg | Freq: Once | INTRAVENOUS | Status: AC
  Filled 2014-07-07: qty 500

## 2014-07-07 MED ORDER — ZOLPIDEM TARTRATE 5 MG PO TABS: 5.0000 mg | ORAL_TABLET | Freq: Every evening | ORAL | Status: DC | PRN

## 2014-07-07 MED ORDER — MONTELUKAST SODIUM 10 MG PO TABS
10.0000 mg | ORAL_TABLET | Freq: Every day | ORAL | Status: DC
  Administered 2014-07-07 – 2014-07-14 (×8): 10 mg via ORAL
  Filled 2014-07-07 (×8): qty 1

## 2014-07-07 MED ORDER — GUAIFENESIN ER 600 MG PO TB12
1200.0000 mg | ORAL_TABLET | Freq: Two times a day (BID) | ORAL | Status: DC
  Administered 2014-07-07 – 2014-07-14 (×15): 1200 mg via ORAL
  Filled 2014-07-07 (×16): qty 2

## 2014-07-07 MED ORDER — SODIUM CHLORIDE 0.9 % IJ SOLN
3.0000 mL | Freq: Two times a day (BID) | INTRAMUSCULAR | Status: DC
  Administered 2014-07-07 – 2014-07-14 (×14): 3 mL via INTRAVENOUS

## 2014-07-07 MED ORDER — ENOXAPARIN SODIUM 40 MG/0.4ML ~~LOC~~ SOLN
40.0000 mg | SUBCUTANEOUS | Status: DC
  Administered 2014-07-07: 40 mg via SUBCUTANEOUS
  Filled 2014-07-07 (×8): qty 0.4

## 2014-07-07 MED ORDER — CEFTRIAXONE SODIUM IN DEXTROSE 20 MG/ML IV SOLN
1.0000 g | INTRAVENOUS | Status: DC
  Administered 2014-07-08 – 2014-07-12 (×5): 1 g via INTRAVENOUS
  Filled 2014-07-07 (×5): qty 50

## 2014-07-07 MED ORDER — METOPROLOL SUCCINATE ER 50 MG PO TB24
50.0000 mg | ORAL_TABLET | Freq: Every day | ORAL | Status: DC
  Administered 2014-07-07 – 2014-07-12 (×6): 50 mg via ORAL
  Filled 2014-07-07 (×6): qty 1

## 2014-07-07 MED ORDER — POTASSIUM CHLORIDE CRYS ER 20 MEQ PO TBCR
40.0000 meq | EXTENDED_RELEASE_TABLET | Freq: Once | ORAL | Status: AC
Start: 2014-07-07 — End: 2014-07-07
  Administered 2014-07-07: 40 meq via ORAL
  Filled 2014-07-07: qty 2

## 2014-07-07 MED ORDER — FLUTICASONE PROPIONATE 50 MCG/ACT NA SUSP
2.0000 | Freq: Every day | NASAL | Status: DC
  Administered 2014-07-07 – 2014-07-14 (×8): 2 via NASAL
  Filled 2014-07-07: qty 16

## 2014-07-07 MED ORDER — POLYETHYLENE GLYCOL 3350 17 G PO PACK
17.0000 g | PACK | Freq: Every day | ORAL | Status: DC | PRN
  Filled 2014-07-07: qty 1

## 2014-07-07 MED ORDER — DOCUSATE SODIUM 100 MG PO CAPS
100.0000 mg | ORAL_CAPSULE | Freq: Two times a day (BID) | ORAL | Status: DC
  Administered 2014-07-07 – 2014-07-13 (×14): 100 mg via ORAL
  Filled 2014-07-07 (×16): qty 1

## 2014-07-07 MED ORDER — ONDANSETRON HCL 4 MG/2ML IJ SOLN: 4.0000 mg | Freq: Four times a day (QID) | INTRAMUSCULAR | Status: DC | PRN

## 2014-07-07 MED ORDER — ONDANSETRON HCL 4 MG PO TABS: 4.0000 mg | ORAL_TABLET | Freq: Four times a day (QID) | ORAL | Status: DC | PRN

## 2014-07-07 MED ORDER — OXYCODONE HCL 5 MG PO TABS
5.0000 mg | ORAL_TABLET | ORAL | Status: DC | PRN
  Administered 2014-07-09 – 2014-07-10 (×3): 5 mg via ORAL
  Filled 2014-07-07 (×3): qty 1

## 2014-07-07 MED ORDER — DEXTROMETHORPHAN POLISTIREX ER 30 MG/5ML PO SUER
30.0000 mg | Freq: Two times a day (BID) | ORAL | Status: DC | PRN
  Administered 2014-07-08 – 2014-07-13 (×6): 30 mg via ORAL
  Filled 2014-07-07 (×8): qty 5

## 2014-07-07 MED ORDER — ACETAMINOPHEN 325 MG PO TABS
650.0000 mg | ORAL_TABLET | Freq: Four times a day (QID) | ORAL | Status: DC | PRN
  Administered 2014-07-09 – 2014-07-12 (×3): 650 mg via ORAL
  Filled 2014-07-07 (×4): qty 2

## 2014-07-07 MED ORDER — IBUPROFEN 200 MG PO TABS
200.0000 mg | ORAL_TABLET | Freq: Four times a day (QID) | ORAL | Status: DC | PRN
  Administered 2014-07-07: 200 mg via ORAL
  Filled 2014-07-07 (×2): qty 1

## 2014-07-07 MED ORDER — ASPIRIN EC 81 MG PO TBEC
81.0000 mg | DELAYED_RELEASE_TABLET | Freq: Every day | ORAL | Status: DC
  Administered 2014-07-07 – 2014-07-14 (×8): 81 mg via ORAL
  Filled 2014-07-07 (×8): qty 1

## 2014-07-07 MED ORDER — DEXTROSE 5 % IV SOLN
500.0000 mg | INTRAVENOUS | Status: DC
  Administered 2014-07-07 – 2014-07-11 (×5): 500 mg via INTRAVENOUS
  Filled 2014-07-07 (×4): qty 500

## 2014-07-07 MED ORDER — ALUM & MAG HYDROXIDE-SIMETH 200-200-20 MG/5ML PO SUSP
30.0000 mL | Freq: Four times a day (QID) | ORAL | Status: DC | PRN
  Administered 2014-07-13: 30 mL via ORAL
  Filled 2014-07-07: qty 30

## 2014-07-07 MED ORDER — POTASSIUM CHLORIDE IN NACL 40-0.9 MEQ/L-% IV SOLN
INTRAVENOUS | Status: DC
  Administered 2014-07-07 – 2014-07-08 (×2): 75 mL/h via INTRAVENOUS
  Filled 2014-07-07 (×2): qty 1000

## 2014-07-07 NOTE — ED Notes (Signed)
Pt placed in gown and in bed. Pt monitored by pulse ox, bp cuff, and 12-lead. 

## 2014-07-07 NOTE — H&P (Signed)
[facility] History and Physical                                                                                    Fabrice Hornback, is a 44 y.o. male  MRN: [id number]   DOB - Aug 22, 1970  Admit Date - 07/07/2014  Outpatient Primary MD for the patient is Jonathon Bellows, MD  Referring Physician:  Dr. Joya Gaskins, [place] Cardiologist: Dr. Wynonia Lawman.  Chief Complaint:   Chief Complaint  Patient presents with  . Chest Pain     HPI  George Erickson  is a 44 y.o. male, who works for [employer].  He has a past medical history of MI status post stent placement, hypertension and hyperlipidemia. He presents to the emergency department this morning with chest pain. George Erickson describes 3 days of left-sided dull chest pain that changes with movement, and worsens with deep breathing.  On Saturday (April 23) he was having chills. This morning at 7:30 AM he woke up diaphoretic, nauseated and with a dry cough. He went to use the bathroom and developed centralized in chest pressure that was reminiscent of his previous MI. At that time he asked his daughter to call 911. He took 2 sublingual nitroglycerin which calmed the pain, and 1 325 mg aspirin.  George Erickson had a stress test in October 2015 at [facility] which was negative for ischemia. He had a cardiac cath approximately 3 years ago that was clean. The EDP spoke with Dr. Wynonia Lawman about the patient's presentation. Dr. Wynonia Lawman recommended that he be formally consulted if the patient's troponin becomes positive.  In the ER his white count is 23.8, potassium 3.4, point-of-care troponin is 0,EKG shows sinus tach with left ventricular hypertrophy. Chest x-ray shows left middle lobe pneumonia. We will admit George Erickson for treatment of community acquired pneumonia and to rule out ACS.  Review of Systems   In addition to the HPI above,  No Headache, No changes with Vision or hearing, No problems swallowing food or Liquids, No Abdominal pain,  Bowel movements are regular, No Blood in  stool or Urine, No dysuria, No new skin rashes or bruises, No new joints pains-aches,  No new weakness, tingling, numbness in any extremity, No recent weight gain or loss, A full 10 point Review of Systems was done, except as stated above, all other Review of Systems were negative.  Past Medical History  Past Medical History  Diagnosis Date  . Hypertension   . MI (myocardial infarction)     History reviewed. No pertinent past surgical history.    Social History History  Substance Use Topics  . Smoking status: Never Smoker   . Smokeless tobacco: Not on file  . Alcohol Use: No   lives at home with his wife, is a Educational psychologist for Dispensing optician at [employer] is still well and we ordered an MRA 5   Family History Father died of CHF.    Prior to Admission medications   Medication Sig Start Date End Date Taking? Authorizing Provider  aspirin EC 81 MG tablet Take 81 mg by mouth daily.   Yes Historical Provider, MD  fluticasone (FLONASE) 50 MCG/ACT nasal spray  Place 2 sprays into the nose daily.   Yes Historical Provider, MD  ibuprofen (ADVIL,MOTRIN) 200 MG tablet Take 200 mg by mouth every 6 (six) hours as needed for moderate pain.   Yes Historical Provider, MD  lisinopril-hydrochlorothiazide (PRINZIDE,ZESTORETIC) 10-12.5 MG per tablet Take 1 tablet by mouth daily.   Yes Historical Provider, MD  metoprolol succinate (TOPROL-XL) 50 MG 24 hr tablet Take 50 mg by mouth daily.   Yes Historical Provider, MD  montelukast (SINGULAIR) 10 MG tablet Take 10 mg by mouth daily.   Yes Historical Provider, MD    Allergies  Allergen Reactions  . Niacin And Related Itching    Physical Exam  Vitals  Blood pressure 123/82, pulse 75, temperature 98.8 F (37.1 C), temperature source Oral, resp. rate 17, SpO2 95 %.   General:  well-developed well-nourished, healthy-appearing, overweight male lying in bed in NAD, wife at bedside  Psych:  Normal affect and insight, Not Suicidal  or Homicidal, Awake Alert, Oriented X 3.  Neuro:   No F.N deficits, ALL C.Nerves Intact, Strength 5/5 all 4 extremities, Sensation intact all 4 extremities.  ENT:  Ears and Eyes appear Normal, Conjunctivae clear, PER. Moist oral mucosa without erythema or exudates.  Neck:  Supple, No lymphadenopathy appreciated  Respiratory:  Minimal crackles at bases, decreased breath sounds on the left, no accessory muscle use  Cardiac:  RRR, No Murmurs, no LE edema noted, no JVD.    Abdomen:  Positive bowel sounds, Soft, Non tender, Non distended,  No masses appreciated  Skin:  No Cyanosis, Normal Skin Turgor, No Skin Rash or Bruise.  Extremities:  Able to move all 4. 5/5 strength in each,  no effusions.  Data Review  CBC  Recent Labs Lab 07/07/14 0850  WBC 23.8*  HGB 12.5*  HCT 35.1*  PLT 356  MCV 82.0  MCH 29.2  MCHC 35.6  RDW 16.6*    Chemistries   Recent Labs Lab 07/07/14 0850  NA 140  K 3.4*  CL 105  CO2 25  GLUCOSE 157*  BUN 9  CREATININE 1.17  CALCIUM 9.1    Imaging results:   Dg Chest 2 View  07/07/2014   CLINICAL DATA:  Acute chest pain.  EXAM: CHEST  2 VIEW  COMPARISON:  December 07, 2011.  FINDINGS: The heart size and mediastinal contours are within normal limits. No pneumothorax is noted. Right lung is clear. Mild left basilar opacity is noted consistent with subsegmental atelectasis or pneumonia with small associated pleural effusion. The visualized skeletal structures are unremarkable.  IMPRESSION: Mild left basilar opacity is noted consistent with subsegmental atelectasis or pneumonia with small associated pleural effusion.   Electronically Signed   By: Marijo Conception, M.D.   On: 07/07/2014 09:43    My personal review of EKG: LVH, Sinus tach.     Assessment & Plan  Active Problems:   CAP (community acquired pneumonia)   Left sided chest pain   Essential hypertension   Hypokalemia   Hyperglycemia  Community acquired pneumonia Left basilar opacity  noted on chest x-ray Admitted under the pneumonia order set. Please follow results for Legionella and strep pneumo urinary antigens, HIV, sputum cultures, flu PCR. Started on Rocephin and azithromycin.  Left sided chest pain Point-of-care troponin 0. Chest pain is atypical in that it is worse with movement and deep breathing. Cycle troponin. Please consult Dr. Wynonia Lawman if troponin becomes positive.  History of coronary artery disease with stent placement Continue aspirin  Hypertension Continue metoprolol  succinate. Will hold lisinopril-HCTZ.  Please restart as appropriate.  Consultants Called:  None formally consulted. Discussed with Dr. Wynonia Lawman the phone.  Family Communication:   Wife, Langley Gauss at bedside  Code Status:  Full  Condition:  Guarded  Potential Disposition: Home in approximately 2-3 days  Time spent in minutes : [address],  PA-C on 07/07/2014 at 11:56 AM Between 7am to 7pm - Pager - [phone] After 7pm go to [email or web address] - password [email or web address] And look for the night coverage person covering me after hours  [facility]

## 2014-07-07 NOTE — ED Provider Notes (Signed)
CSN: [id number]     Arrival date & time 07/07/14  [id number] History   First MD Initiated Contact with Patient 07/07/14 [phone]     Chief Complaint  Patient presents with  . Chest Pain   (Consider location/radiation/quality/duration/timing/severity/associated sxs/prior Treatment) Patient is a 44 y.o. male presenting with chest pain. The history is provided by the patient. No language interpreter was used.  Chest Pain Pain location:  Substernal area Pain quality: pressure   Pain radiates to:  R shoulder Pain radiates to the back: no   Pain severity:  Severe Onset quality:  Sudden Progression:  Resolved Chronicity:  Recurrent Context: at rest   Relieved by:  Nitroglycerin and aspirin Worsened by:  Nothing tried Ineffective treatments:  None tried Associated symptoms: anxiety, cough, diaphoresis and nausea   Associated symptoms: no abdominal pain, no altered mental status, no fatigue, no fever, no headache, no heartburn, no numbness, no palpitations, no shortness of breath, not vomiting and no weakness   Risk factors: coronary artery disease, high cholesterol, hypertension and male sex   Risk factors: no prior DVT/PE     Past Medical History  Diagnosis Date  . Hypertension   . MI (myocardial infarction)    History reviewed. No pertinent past surgical history. History reviewed. No pertinent family history. History  Substance Use Topics  . Smoking status: Never Smoker   . Smokeless tobacco: Not on file  . Alcohol Use: No    Review of Systems  Constitutional: Positive for diaphoresis. Negative for fever and fatigue.  Respiratory: Positive for cough and chest tightness. Negative for shortness of breath.   Cardiovascular: Positive for chest pain. Negative for palpitations.  Gastrointestinal: Positive for nausea. Negative for heartburn, vomiting and abdominal pain.  Neurological: Negative for weakness, light-headedness, numbness and headaches.  Psychiatric/Behavioral: Negative for  confusion.  All other systems reviewed and are negative.     Allergies  Niacin and related  Home Medications   Prior to Admission medications   Not on File   BP 125/76 mmHg  Pulse 103  Temp(Src) 98.8 F (37.1 C) (Oral)  Resp 18  SpO2 97% Physical Exam  Constitutional: He is oriented to person, place, and time. He appears well-developed and well-nourished. He is cooperative. He does not appear ill. No distress.  HENT:  Head: Normocephalic and atraumatic.  Nose: Nose normal.  Mouth/Throat: Oropharynx is clear and moist. No oropharyngeal exudate.  Eyes: EOM are normal. Pupils are equal, round, and reactive to light.  Neck: Normal range of motion. Neck supple.  Cardiovascular: Normal rate, regular rhythm, normal heart sounds and intact distal pulses.   No murmur heard. Pulmonary/Chest: Effort normal and breath sounds normal. No respiratory distress. He has no wheezes. He exhibits no tenderness.  No reproducible chest tenderness to palpation   Abdominal: Soft. He exhibits no distension. There is no tenderness. There is no guarding.  Musculoskeletal: Normal range of motion. He exhibits no tenderness.  Neurological: He is alert and oriented to person, place, and time. No cranial nerve deficit. Coordination normal.  Skin: Skin is warm and dry. He is not diaphoretic. No pallor.  Psychiatric: He has a normal mood and affect. His behavior is normal. Judgment and thought content normal.  Nursing note and vitals reviewed.   ED Course  Procedures (including critical care time) Labs Review Labs Reviewed  CBC - Abnormal; Notable for the following:    WBC 23.8 (*)    Hemoglobin 12.5 (*)    HCT 35.1 (*)  RDW 16.6 (*)    All other components within normal limits  BASIC METABOLIC PANEL - Abnormal; Notable for the following:    Potassium 3.4 (*)    Glucose, Bld 157 (*)    GFR calc non Af Amer 74 (*)    GFR calc Af Amer 86 (*)    All other components within normal limits  CULTURE,  BLOOD (ROUTINE X 2)  CULTURE, BLOOD (ROUTINE X 2)  I-STAT TROPOININ, ED   Imaging Review Dg Chest 2 View  07/07/2014   CLINICAL DATA:  Acute chest pain.  EXAM: CHEST  2 VIEW  COMPARISON:  December 07, 2011.  FINDINGS: The heart size and mediastinal contours are within normal limits. No pneumothorax is noted. Right lung is clear. Mild left basilar opacity is noted consistent with subsegmental atelectasis or pneumonia with small associated pleural effusion. The visualized skeletal structures are unremarkable.  IMPRESSION: Mild left basilar opacity is noted consistent with subsegmental atelectasis or pneumonia with small associated pleural effusion.   Electronically Signed   By: Marijo Conception, M.D.   On: 07/07/2014 09:43     EKG Interpretation   Date/Time:  Tuesday July 07 2014 08:23:32 EDT Ventricular Rate:  101 PR Interval:  160 QRS Duration: 87 QT Interval:  359 QTC Calculation: 465 R Axis:   41 Text Interpretation:  Sinus tachycardia Left ventricular hypertrophy  Nonspecific T abnormalities, lateral leads nonspecific ST and T wave  changes as compared to prior Confirmed by CAMPOS  MD, KEVIN ([id number]) on  07/07/2014 8:28:32 AM      MDM   Final diagnoses:  Chest pain, unspecified chest pain type  CAP (community acquired pneumonia)   Pt is a 44 yo M with hx of CAD, HTN, and HLD who presents with chest pain.   Has had a nagging left sided chest pain for the past 3 days.  Constant, mild pain at left lateral chest.  Then woke this AM around 0700 and felt "off", with lightheadedness and nausea.  Went to the bathroom and developed substernal chest pressure with pains that radiated to right shoulder, similar to his MI.   Was diffusely diaphoretic.  He took 2 x SL NTG and 324 mg ASA, then presented to the ED via EMS.    Hemodynamically stable on arrival but with tachycardia to 110s .  Pain free here (~90 minutes after onset of acute chest pain).    EKG with lateral  wave inversions and  nonspecific ST elevations, which are slightly different than previous EKG in our system.    Follows with Dr. Wynonia Lawman in Cards clinic.  Had a stent in diagonal placed in 2011.  Cardiac Cath Results: normal Left main, 40% stenosis mid LAD, patent stent diagonal, scattered irregularities CFX, 20 % stenosis proximal RCA; LVEF of 60% documented via cardiac cath on 11/21/2010.  Benign treadmill stress test in October 2015.    iStat trop is negative.   WBC 23.8.    Dr. Thurman Coyer office was paged at 0920.    CXR with left basilar opacity.  Will treat for CAP due to leukocytosis, left sided chest pain, and tachycardia.  Blood cultures obtained and he was covered with rocephin and azithromycin.  He reports mild cough since last night, but denies SOB.  No fevers that he is aware of.    Spoke with Dr. Wynonia Lawman at his office at 1000 to discuss the patient. He recommended admitting to medicine team to cycle enzymes.  Will come see patient if  admitting team desires.   Patient sees Dr. Justin Mend at [facility], so will admit to hospitalist team.  To go to tele bed, floor appropriate.    Patient was seen with ED Attending, Dr. Lucia Gaskins, MD   Tori Milks, MD 07/08/14 [id number]  Quintella Reichert, MD 07/09/14 1052

## 2014-07-07 NOTE — Progress Notes (Signed)
Called ED to get report on patient. Nurse busy doing central line at the moment

## 2014-07-07 NOTE — ED Notes (Signed)
Pt reported central chest pain that radiated to back that began at 0730 this morning.  Pain was described as a sharp stabbing pain.  Pt denies any SOB, nausea, diaphoresis or dizziness with the chest pain.  Pt has a hx of MI.  Pt denies any chest pain at this time but does reports LUQ pain that has been present "for a few days".

## 2014-07-07 NOTE — ED Notes (Signed)
[id number]-Attempted report 3E.

## 2014-07-07 NOTE — Progress Notes (Signed)
Patient placed on droplet precautions to r/o FLU.

## 2014-07-07 NOTE — ED Notes (Signed)
Blood cultures x2 collected °

## 2014-07-07 NOTE — Progress Notes (Signed)
FLU panel negative. Droplet precautions discontinued.

## 2014-07-08 DIAGNOSIS — R079 Chest pain, unspecified: Secondary | ICD-10-CM

## 2014-07-08 HISTORY — PX: TRANSTHORACIC ECHOCARDIOGRAM: SHX275

## 2014-07-08 LAB — CBC
HCT: 33.4 % — ABNORMAL LOW (ref 39.0–52.0)
Hemoglobin: 12.1 g/dL — ABNORMAL LOW (ref 13.0–17.0)
MCH: 29.7 pg (ref 26.0–34.0)
MCHC: 36.2 g/dL — ABNORMAL HIGH (ref 30.0–36.0)
MCV: 82.1 fL (ref 78.0–100.0)
Platelets: 376 10*3/uL (ref 150–400)
RBC: 4.07 MIL/uL — ABNORMAL LOW (ref 4.22–5.81)
RDW: 16.7 % — ABNORMAL HIGH (ref 11.5–15.5)
WBC: 24.1 10*3/uL — ABNORMAL HIGH (ref 4.0–10.5)

## 2014-07-08 LAB — COMPREHENSIVE METABOLIC PANEL
ALT: 13 U/L (ref 0–53)
AST: 15 U/L (ref 0–37)
Albumin: 3.6 g/dL (ref 3.5–5.2)
Alkaline Phosphatase: 46 U/L (ref 39–117)
Anion gap: 7 (ref 5–15)
BUN: 9 mg/dL (ref 6–23)
CO2: 25 mmol/L (ref 19–32)
Calcium: 8.6 mg/dL (ref 8.4–10.5)
Chloride: 105 mmol/L (ref 96–112)
Creatinine, Ser: 1.21 mg/dL (ref 0.50–1.35)
GFR calc Af Amer: 83 mL/min — ABNORMAL LOW (ref >90)
GFR calc non Af Amer: 71 mL/min — ABNORMAL LOW (ref >90)
Glucose, Bld: 128 mg/dL — ABNORMAL HIGH (ref 70–99)
Potassium: 3.8 mmol/L (ref 3.5–5.1)
Sodium: 137 mmol/L (ref 135–145)
Total Bilirubin: 1 mg/dL (ref 0.3–1.2)
Total Protein: 6.7 g/dL (ref 6.0–8.3)

## 2014-07-08 LAB — LEGIONELLA ANTIGEN, URINE

## 2014-07-08 LAB — HEMOGLOBIN A1C
Hgb A1c MFr Bld: 4.2 % — ABNORMAL LOW (ref 4.8–5.6)
Mean Plasma Glucose: 74 mg/dL

## 2014-07-08 LAB — HIV ANTIBODY (ROUTINE TESTING W REFLEX): HIV Screen 4th Generation wRfx: NONREACTIVE

## 2014-07-08 LAB — D-DIMER, QUANTITATIVE: D-Dimer, Quant: 1.96 ug/mL-FEU — ABNORMAL HIGH (ref 0.00–0.48)

## 2014-07-08 LAB — TROPONIN I: Troponin I: <0.03 ng/mL (ref <0.031)

## 2014-07-08 MED ORDER — IPRATROPIUM-ALBUTEROL 0.5-2.5 (3) MG/3ML IN SOLN
3.0000 mL | Freq: Four times a day (QID) | RESPIRATORY_TRACT | Status: DC
  Administered 2014-07-08: 3 mL via RESPIRATORY_TRACT
  Filled 2014-07-08: qty 3

## 2014-07-08 MED ORDER — KETOROLAC TROMETHAMINE 30 MG/ML IJ SOLN: 30.0000 mg | Freq: Four times a day (QID) | INTRAMUSCULAR | Status: DC | PRN

## 2014-07-08 NOTE — Progress Notes (Signed)
Pt awaken by sharp pain 10/10 PS upon breathing on his right side mid axillary area , it eases a bit when he tries to sit up but it will come back as soon as he tries to lay back on the bed. Morphine IV given as ordered. K Baltazar Najjar was notified. Will continue to monitor pt

## 2014-07-08 NOTE — Progress Notes (Signed)
PROGRESS NOTE  George Erickson [id number] DOB: Dec 06, 1970 DOA: 07/07/2014 PCP: Jonathon Bellows, MD  HPI/Recap of past 24 hours:  Reported feeling better, bilateral flank pain, worse when laying down, currently no pain, no sob, occasional dry cough during exam. Wife in room.  Assessment/Plan: Active Problems:   CAP (community acquired pneumonia)   Left sided chest pain   Essential hypertension   Hypokalemia   Hyperglycemia  CAP , continue abx, does has significant leukocytosis, add duoneb, if not improving, will consider ct chest.   H/o cad s/p stent, stable, cardiac enzyme negative.   H/o htn, lisinopril/hctz held since admission  H/o osa, on cpap at night  Code Status: full  Family Communication: patient and family  Disposition Plan: remain inpatient   Consultants:  none  Procedures:  none  Antibiotics:  Rocephin/zithro   Objective: BP 139/90 mmHg  Pulse 79  Temp(Src) 98.7 F (37.1 C) (Oral)  Resp 20  Ht 6' (1.829 m)  Wt 110.496 kg (243 lb 9.6 oz)  BMI 33.03 kg/m2  SpO2 95%  Intake/Output Summary (Last 24 hours) at 07/08/14 1928 Last data filed at 07/08/14 1518  Gross per 24 hour  Intake 2373.75 ml  Output   2881 ml  Net -507.25 ml   Filed Weights   07/07/14 1216 07/08/14 0452  Weight: 108.999 kg (240 lb 4.8 oz) 110.496 kg (243 lb 9.6 oz)    Exam:   General:  NAD  Cardiovascular: RRR  Respiratory: good airenty, mild wheezes at bases  Abdomen: Soft/ND/NT, positive BS  Musculoskeletal: No Edema  Neuro: aaox3  Data Reviewed: Basic Metabolic Panel:  Recent Labs Lab 07/07/14 0850 07/07/14 1423 07/08/14 0027  NA 140  --  137  K 3.4*  --  3.8  CL 105  --  105  CO2 25  --  25  GLUCOSE 157*  --  128*  BUN 9  --  9  CREATININE 1.17  --  1.21  CALCIUM 9.1  --  8.6  MG  --  1.8  --    Liver Function Tests:  Recent Labs Lab 07/08/14 0027  AST 15  ALT 13  ALKPHOS 46  BILITOT 1.0  PROT 6.7  ALBUMIN 3.6   No results  for input(s): LIPASE, AMYLASE in the last 168 hours. No results for input(s): AMMONIA in the last 168 hours. CBC:  Recent Labs Lab 07/07/14 0850 07/08/14 0027  WBC 23.8* 24.1*  HGB 12.5* 12.1*  HCT 35.1* 33.4*  MCV 82.0 82.1  PLT 356 376   Cardiac Enzymes:    Recent Labs Lab 07/07/14 1423 07/07/14 1818 07/08/14 0027  TROPONINI <0.03 <0.03 <0.03   BNP (last 3 results) No results for input(s): BNP in the last 8760 hours.  ProBNP (last 3 results) No results for input(s): PROBNP in the last 8760 hours.  CBG: No results for input(s): GLUCAP in the last 168 hours.  Recent Results (from the past 240 hour(s))  Blood culture (routine x 2)     Status: None (Preliminary result)   Collection Time: 07/07/14 10:01 AM  Result Value Ref Range Status   Specimen Description BLOOD LEFT ANTECUBITAL  Final   Special Requests BOTTLES DRAWN AEROBIC AND ANAEROBIC 5CC  Final   Culture   Final           BLOOD CULTURE RECEIVED NO GROWTH TO DATE CULTURE WILL BE HELD FOR 5 DAYS BEFORE ISSUING A FINAL NEGATIVE REPORT Performed at [facility]  Report Status PENDING  Incomplete  Blood culture (routine x 2)     Status: None (Preliminary result)   Collection Time: 07/07/14 10:13 AM  Result Value Ref Range Status   Specimen Description BLOOD RIGHT ANTECUBITAL  Final   Special Requests BOTTLES DRAWN AEROBIC AND ANAEROBIC 5CC  Final   Culture   Final           BLOOD CULTURE RECEIVED NO GROWTH TO DATE CULTURE WILL BE HELD FOR 5 DAYS BEFORE ISSUING A FINAL NEGATIVE REPORT Performed at [facility]    Report Status PENDING  Incomplete     Studies: Dg Chest 2 View  07/07/2014   CLINICAL DATA:  Acute chest pain.  EXAM: CHEST  2 VIEW  COMPARISON:  December 07, 2011.  FINDINGS: The heart size and mediastinal contours are within normal limits. No pneumothorax is noted. Right lung is clear. Mild left basilar opacity is noted consistent with subsegmental atelectasis or pneumonia with  small associated pleural effusion. The visualized skeletal structures are unremarkable.  IMPRESSION: Mild left basilar opacity is noted consistent with subsegmental atelectasis or pneumonia with small associated pleural effusion.   Electronically Signed   By: Marijo Conception, M.D.   On: 07/07/2014 09:43    Scheduled Meds: . aspirin EC  81 mg Oral Daily  . azithromycin  500 mg Intravenous Q24H  . cefTRIAXone (ROCEPHIN)  IV  1 g Intravenous Q24H  . docusate sodium  100 mg Oral BID  . enoxaparin (LOVENOX) injection  40 mg Subcutaneous Q24H  . fluticasone  2 spray Each Nare Daily  . guaiFENesin  1,200 mg Oral BID  . ipratropium-albuterol  3 mL Nebulization Q6H  . metoprolol succinate  50 mg Oral Daily  . montelukast  10 mg Oral Daily  . sodium chloride  3 mL Intravenous Q12H    Continuous Infusions:    Time spent: 66mins  Cohen Boettner MD, PhD  [facility] Pager [phone]. If 7PM-7AM, please contact night-coverage at [email or web address], password [facility] 07/08/2014, 7:28 PM  LOS: 1 day

## 2014-07-08 NOTE — Care Management Note (Signed)
    Page 1 of 1   07/14/2014     2:52:39 PM CARE MANAGEMENT NOTE 07/14/2014  Patient:  George Erickson, George Erickson   Account Number:  [id number]  Date Initiated:  07/08/2014  Documentation initiated by:  Lilas Diefendorf  Subjective/Objective Assessment:   Pt adm on 07/07/14 with Lt PNA, CP.  PTA, pt resides at home alone and is independent.     Action/Plan:   Will follow for dc needs as pt progresses.   Anticipated DC Date:  07/14/2014   Anticipated DC Plan:  [place]  CM consult      Choice offered to / List presented to:             Status of service:  Completed, signed off Medicare Important Message given?  NO (If response is "NO", the following Medicare IM given date fields will be blank) Date Medicare IM given:   Medicare IM given by:   Date Additional Medicare IM given:   Additional Medicare IM given by:    Discharge Disposition:  HOME/SELF CARE  Per UR Regulation:  Reviewed for med. necessity/level of care/duration of stay  If discussed at [place] of Stay Meetings, dates discussed:    Comments:  07/14/14 Ellan Lambert, RN, BSN [phone] PT recommending no OP follow up.  No dc needs identified.

## 2014-07-08 NOTE — Progress Notes (Signed)
Echocardiogram 2D Echocardiogram has been performed.  George Erickson 07/08/2014, 10:38 AM

## 2014-07-09 ENCOUNTER — Inpatient Hospital Stay (HOSPITAL_COMMUNITY): Payer: BLUE CROSS/BLUE SHIELD

## 2014-07-09 DIAGNOSIS — R0902 Hypoxemia: Secondary | ICD-10-CM

## 2014-07-09 DIAGNOSIS — J90 Pleural effusion, not elsewhere classified: Secondary | ICD-10-CM

## 2014-07-09 DIAGNOSIS — G4733 Obstructive sleep apnea (adult) (pediatric): Secondary | ICD-10-CM

## 2014-07-09 DIAGNOSIS — J189 Pneumonia, unspecified organism: Principal | ICD-10-CM

## 2014-07-09 LAB — CBC WITH DIFFERENTIAL/PLATELET
Basophils Absolute: 0.1 10*3/uL (ref 0.0–0.1)
Basophils Relative: 1 % (ref 0–1)
Eosinophils Absolute: 0.1 10*3/uL (ref 0.0–0.7)
Eosinophils Relative: 0 % (ref 0–5)
HCT: 29.9 % — ABNORMAL LOW (ref 39.0–52.0)
Hemoglobin: 10.5 g/dL — ABNORMAL LOW (ref 13.0–17.0)
Lymphocytes Relative: 9 % — ABNORMAL LOW (ref 12–46)
Lymphs Abs: 1.9 10*3/uL (ref 0.7–4.0)
MCH: 28.7 pg (ref 26.0–34.0)
MCHC: 35.1 g/dL (ref 30.0–36.0)
MCV: 81.7 fL (ref 78.0–100.0)
Monocytes Absolute: 3.1 10*3/uL — ABNORMAL HIGH (ref 0.1–1.0)
Monocytes Relative: 15 % — ABNORMAL HIGH (ref 3–12)
Neutro Abs: 15.7 10*3/uL — ABNORMAL HIGH (ref 1.7–7.7)
Neutrophils Relative %: 75 % (ref 43–77)
Platelets: 358 10*3/uL (ref 150–400)
RBC: 3.66 MIL/uL — ABNORMAL LOW (ref 4.22–5.81)
RDW: 17 % — ABNORMAL HIGH (ref 11.5–15.5)
WBC: 21 10*3/uL — ABNORMAL HIGH (ref 4.0–10.5)

## 2014-07-09 LAB — EXPECTORATED SPUTUM ASSESSMENT W GRAM STAIN, RFLX TO RESP C

## 2014-07-09 LAB — CBC
HCT: 30.6 % — ABNORMAL LOW (ref 39.0–52.0)
Hemoglobin: 11 g/dL — ABNORMAL LOW (ref 13.0–17.0)
MCH: 28.8 pg (ref 26.0–34.0)
MCHC: 35.9 g/dL (ref 30.0–36.0)
MCV: 80.1 fL (ref 78.0–100.0)
Platelets: 352 10*3/uL (ref 150–400)
RBC: 3.82 MIL/uL — ABNORMAL LOW (ref 4.22–5.81)
RDW: 16.8 % — ABNORMAL HIGH (ref 11.5–15.5)
WBC: 20.4 10*3/uL — ABNORMAL HIGH (ref 4.0–10.5)

## 2014-07-09 LAB — EXPECTORATED SPUTUM ASSESSMENT W REFEX TO RESP CULTURE

## 2014-07-09 LAB — BASIC METABOLIC PANEL
Anion gap: 10 (ref 5–15)
BUN: 9 mg/dL (ref 6–23)
CO2: 24 mmol/L (ref 19–32)
Calcium: 8.8 mg/dL (ref 8.4–10.5)
Chloride: 104 mmol/L (ref 96–112)
Creatinine, Ser: 1.22 mg/dL (ref 0.50–1.35)
GFR calc Af Amer: 82 mL/min — ABNORMAL LOW (ref >90)
GFR calc non Af Amer: 71 mL/min — ABNORMAL LOW (ref >90)
Glucose, Bld: 119 mg/dL — ABNORMAL HIGH (ref 70–99)
Potassium: 3.8 mmol/L (ref 3.5–5.1)
Sodium: 138 mmol/L (ref 135–145)

## 2014-07-09 LAB — BRAIN NATRIURETIC PEPTIDE: B Natriuretic Peptide: 15.6 pg/mL (ref 0.0–100.0)

## 2014-07-09 LAB — MAGNESIUM: Magnesium: 1.7 mg/dL (ref 1.5–2.5)

## 2014-07-09 MED ORDER — IPRATROPIUM-ALBUTEROL 0.5-2.5 (3) MG/3ML IN SOLN: 3.0000 mL | Freq: Four times a day (QID) | RESPIRATORY_TRACT | Status: DC | PRN

## 2014-07-09 MED ORDER — SODIUM CHLORIDE 0.9 % IV SOLN
INTRAVENOUS | Status: AC
  Administered 2014-07-09 – 2014-07-10 (×2): via INTRAVENOUS

## 2014-07-09 MED ORDER — IBUPROFEN 400 MG PO TABS
400.0000 mg | ORAL_TABLET | Freq: Three times a day (TID) | ORAL | Status: DC | PRN
  Administered 2014-07-09 – 2014-07-13 (×8): 400 mg via ORAL
  Filled 2014-07-09 (×14): qty 1

## 2014-07-09 MED ORDER — IOHEXOL 350 MG/ML SOLN
100.0000 mL | Freq: Once | INTRAVENOUS | Status: AC | PRN
  Administered 2014-07-09: 100 mL via INTRAVENOUS

## 2014-07-09 NOTE — Consult Note (Signed)
Name: NAKIA GOLLIHER MRN: [id number] DOB: 09-03-1970    ADMISSION DATE:  07/07/2014 CONSULTATION DATE:  4/28  REFERRING MD :  Erlinda Hong ([facility])   CHIEF COMPLAINT:  PNA   BRIEF PATIENT DESCRIPTION: 44yo male with hx OSA on CPAP, CAD admitted 4/26 by [facility] with CAP and treated with Rocephin/Azithro.   Had ongoing dyspnea, high fevers and PCCM consulted.   SIGNIFICANT EVENTS    STUDIES:  CTA chest 4/28>>> NEG PE, bilat effusions and consolidation    HISTORY OF PRESENT ILLNESS:  44yo male with hx OSA, CAD s/p MI and stent placement 2011.  Works for [employer] in [place].  [place] 4/26 with 3 day hx dull L sided pleuritic chest pain, diaphoresis and cough.  His chest pain initially worsened and felt similar to his previous MI so his daughter called 911 and he presented to [facility] ER.  In ER troponins were negative (had negative stress test at [facility] last year and clean cath 3 years ago) and was found to be febrile, wbc 24 and CXR with LML infiltrate.  [facility] admitted with CAP and he was treated with Rocephin/Axithro.  Despite this he has continued to have fevers, dyspnea and PCCM consulted 4/28.   Currently feeling a little better but does still c/o malaise, myalgias, chills, pleuritic chest pain, dry cough.   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  No recent sick contacts  Does tend to have difficulty swallowing at times - feels like food "gets stuck".  No recent weight loss, night sweats, hemoptysis Has been out of work recently r/t back injury/pain    PAST MEDICAL HISTORY :   has a past medical history of Hypertension; MI (myocardial infarction); Coronary artery disease; Arthritis; and Sleep apnea.  has past surgical history that includes Coronary stent placement (2011); Nasal sinus surgery; and Hernia repair. Prior to Admission medications   Medication Sig Start Date End Date Taking? Authorizing Provider  aspirin EC 81 MG  tablet Take 81 mg by mouth daily.   Yes Historical Provider, MD  fluticasone (FLONASE) 50 MCG/ACT nasal spray Place 2 sprays into the nose daily.   Yes Historical Provider, MD  ibuprofen (ADVIL,MOTRIN) 200 MG tablet Take 200 mg by mouth every 6 (six) hours as needed for moderate pain.   Yes Historical Provider, MD  lisinopril-hydrochlorothiazide (PRINZIDE,ZESTORETIC) 10-12.5 MG per tablet Take 1 tablet by mouth daily.   Yes Historical Provider, MD  metoprolol succinate (TOPROL-XL) 50 MG 24 hr tablet Take 50 mg by mouth daily.   Yes Historical Provider, MD  montelukast (SINGULAIR) 10 MG tablet Take 10 mg by mouth daily.   Yes Historical Provider, MD   Allergies  Allergen Reactions  . Niacin And Related Itching    FAMILY HISTORY:  family history is not on file. SOCIAL HISTORY:  reports that he has never smoked. He has never used smokeless tobacco. He reports that he does not drink alcohol or use illicit drugs.  REVIEW OF SYSTEMS:   As per HPI - All other systems reviewed and were neg.    SUBJECTIVE:   VITAL SIGNS: Temp:  [98.7 F (37.1 C)-102.1 F (38.9 C)] 102.1 F (38.9 C) (04/28 0929) Pulse Rate:  [79-107] 105 (04/28 0929) Resp:  [20] 20 (04/28 0929) BP: (139-160)/(77-91) 143/77 mmHg (04/28 0929) SpO2:  [94 %-96 %] 96 % (04/28 0929) Weight:  [243 lb 4.8 oz (110.36 kg)] 243 lb 4.8 oz (110.36  kg) (04/28 0435)  PHYSICAL EXAMINATION: General:  Very pleasant, wdwn male, appears older than stated age, NAD  Neuro:  Awake, alert, appropriate, MAE  HEENT:  Mm moist, no JVD  Cardiovascular:  s1s2 rrr Lungs:  resps even non labored on RA, CTA posteriorly   Abdomen:  Soft, non tender, +bs  Musculoskeletal:  Warm and dry, no edema    Recent Labs Lab 07/07/14 0850 07/08/14 0027 07/09/14 0430  NA 140 137 138  K 3.4* 3.8 3.8  CL 105 105 104  CO2 25 25 24   BUN 9 9 9   CREATININE 1.17 1.21 1.22  GLUCOSE 157* 128* 119*    Recent Labs Lab 07/07/14 0850 07/08/14 0027  07/09/14 0430  HGB 12.5* 12.1* 11.0*  HCT 35.1* 33.4* 30.6*  WBC 23.8* 24.1* 20.4*  PLT 356 376 352   Ct Angio Chest Pe W/cm &/or Wo Cm  07/09/2014   CLINICAL DATA:  Chest pain  EXAM: CT ANGIOGRAPHY CHEST WITH CONTRAST  TECHNIQUE: Multidetector CT imaging of the chest was performed using the standard protocol during bolus administration of intravenous contrast. Multiplanar CT image reconstructions and MIPs were obtained to evaluate the vascular anatomy.  CONTRAST:  138mL OMNIPAQUE IOHEXOL 350 MG/ML SOLN  COMPARISON:  Chest CT November 15, 2009; chest radiograph July 07, 2014  FINDINGS: There is no demonstrable pulmonary embolus. There is no thoracic aortic aneurysm or dissection.  There are pleural effusions bilaterally with bibasilar lung consolidation. Consolidation is also noted in the inferior lingula.  There is no demonstrable thoracic adenopathy. The pericardium is not thickened. Thyroid appears normal.  There is left ventricular hypertrophy. There is calcification in the left anterior descending coronary artery.  In the visualized upper abdomen, no lesion is identified.  There are no blastic or lytic bone lesions.  Review of the MIP images confirms the above findings.  IMPRESSION: No demonstrable pulmonary embolus. Bilateral pleural effusions with lateral lower lobe and inferior lingular airspace consolidation. No demonstrable adenopathy. Areas of coronary artery calcification, more than generally seen in this age group. Mild left ventricular hypertrophy.   Electronically Signed   By: Lowella Grip III M.D.   On: 07/09/2014 10:07    ASSESSMENT / PLAN:  CAP - new/ongoing fevers despite 48h abx.  ?viral illness in addition v atypical CAP.  Flu, HIV neg. Does report difficulty swallowing at times. No obvious environmental or occupational exposures.  No recent travel. NEVER smoker.  Respiratory status stable, comfortable on RA.   OSA   REC -  Broaden abx - ID to see  Pulmonary hygiene - add  IS, flutter  Sputum culture if able  Cbc with Diff  Consider swallow eval v barium swallow  Urine strep, legionella  Continue qhs CPAP   Nickolas Madrid, NP 07/09/2014  11:36 AM Pager: [phone] or [phone]  I reviewed CTA myself, negative for PE but does have bilateral pleural effusions and left sided air space consolidation.  Pleural effusion: likely related to cardiac rather than infectious process.  - 2D echo ordered.  - Diurese as able.  - F/U CXR.  - Check BNP.  CAP: new and worsening.  Fever worse.  48 hours of abx thus far.  Negative for flu and HIV.  - Broaden abx coverage as above.  - F/U on cultures.  - Request new sputum cultures.  - Urine strep and legionella ordered.  Fever: worsening over the last 24 hours.  - Tylenol.  - Abx as above.  - F/U on  culture.  - Recommend consulting ID.  OSA: not diagnosed but highly likely.  - CPAP QHS.  - Will need a sleep study as outpatient.  - Will need a titration study as outpatient.  Patient seen and examined, agree with above note.  I dictated the care and orders written for this patient under my direction.  Rush Farmer, MD [phone]

## 2014-07-09 NOTE — Consult Note (Addendum)
[facility] for Infectious Disease     Reason for Consult: CAP    Referring Physician: Dr. Erlinda Hong  Active Problems:   CAP (community acquired pneumonia)   Left sided chest pain   Essential hypertension   Hypokalemia   Hyperglycemia   . aspirin EC  81 mg Oral Daily  . azithromycin  500 mg Intravenous Q24H  . cefTRIAXone (ROCEPHIN)  IV  1 g Intravenous Q24H  . docusate sodium  100 mg Oral BID  . enoxaparin (LOVENOX) injection  40 mg Subcutaneous Q24H  . fluticasone  2 spray Each Nare Daily  . guaiFENesin  1,200 mg Oral BID  . metoprolol succinate  50 mg Oral Daily  . montelukast  10 mg Oral Daily  . sodium chloride  3 mL Intravenous Q12H    Recommendations: Continue with ceftriaxone and azithromycin  Consider scheduled NSAIDS for pleuritis  Assessment: He has CAP as evidenced by pleural opacity and effusions with fever, increased WBC, no other source.  No recently hospitalized or exposed to MDROs. Typically takes 3-5 days for fever to resolve, longer if viral.    Antibiotics: Ceftriaxone and azithromcycin  HPI: George Erickson is a 44 y.o. male with no significant pmh who noted left shoulder pain Friday and then worsening chest pain and came to ED Tuesday am. Chest pain has been pleuritic, associated with chills and now began having fever once in the hospital.  + dry cough and less po/appetite.  Initially concerned with MI but troponin ok.  CTA with effusions and airspace consolidation.  Has not felt SOB, has walked in the halls and does not get SOB, has not been hypoxic.  WBC elevated to 24,000.  No sick contacts.     Review of Systems: A comprehensive review of systems was negative.  Past Medical History  Diagnosis Date  . Hypertension   . MI (myocardial infarction)   . Coronary artery disease   . Arthritis     lower back  . Sleep apnea     uses cpap    History  Substance Use Topics  . Smoking status: Never Smoker   . Smokeless tobacco: Never Used  .  Alcohol Use: No    History reviewed. No pertinent family history. Allergies  Allergen Reactions  . Niacin And Related Itching    OBJECTIVE: Blood pressure 135/81, pulse 86, temperature 99.9 F (37.7 C), temperature source Oral, resp. rate 20, height 6' (1.829 m), weight 243 lb 4.8 oz (110.36 kg), SpO2 95 %. General: awake, alert, nad, speaking in full sentences, no respiratory distress Skin: no rashes Lungs: CTA B, no crackles noted Cor: RRR Abdomen: soft, nt, nd Ext: no edema  Microbiology: Recent Results (from the past 240 hour(s))  Blood culture (routine x 2)     Status: None (Preliminary result)   Collection Time: 07/07/14 10:01 AM  Result Value Ref Range Status   Specimen Description BLOOD LEFT ANTECUBITAL  Final   Special Requests BOTTLES DRAWN AEROBIC AND ANAEROBIC 5CC  Final   Culture   Final           BLOOD CULTURE RECEIVED NO GROWTH TO DATE CULTURE WILL BE HELD FOR 5 DAYS BEFORE ISSUING A FINAL NEGATIVE REPORT Performed at [facility]    Report Status PENDING  Incomplete  Blood culture (routine x 2)     Status: None (Preliminary result)   Collection Time: 07/07/14 10:13 AM  Result Value Ref Range Status   Specimen Description BLOOD RIGHT  ANTECUBITAL  Final   Special Requests BOTTLES DRAWN AEROBIC AND ANAEROBIC 5CC  Final   Culture   Final           BLOOD CULTURE RECEIVED NO GROWTH TO DATE CULTURE WILL BE HELD FOR 5 DAYS BEFORE ISSUING A FINAL NEGATIVE REPORT Performed at [facility]    Report Status PENDING  Incomplete  Culture, sputum-assessment     Status: None   Collection Time: 07/09/14  1:20 AM  Result Value Ref Range Status   Specimen Description SPUTUM  Final   Special Requests NONE  Final   Sputum evaluation   Final    MICROSCOPIC FINDINGS SUGGEST THAT THIS SPECIMEN IS NOT REPRESENTATIVE OF LOWER RESPIRATORY SECRETIONS. PLEASE RECOLLECT. RESULT CALLED TO, READ BACK BY AND VERIFIED WITH: NOTIFIED L WNOT,RN 07/09/14 0309 BY RHOLMES    Report Status 07/09/2014 FINAL  Final    Scharlene Gloss, [place] for Infectious Disease [facility] [email or web address] [phone] pager  [phone] cell 07/09/2014, 2:58 PM

## 2014-07-09 NOTE — Progress Notes (Signed)
PROGRESS NOTE  [name] [id number] DOB: 1970-11-15 DOA: 07/07/2014 PCP: Jonathon Bellows, MD  HPI/Recap of past 24 hours:  Spiking fever, dry cough, also c/o chronic right knee pain   Assessment/Plan: Active Problems:   CAP (community acquired pneumonia)   Left sided chest pain   Essential hypertension   Hypokalemia   Hyperglycemia  CAP , spiking fever , sinus tachycardia on rocephin/zithro , persistent leukocytosis,  ctA chest, no PE + bilateral pleural effusion/consolidation. Nonproductive cough, culture unrevealing so far, will add cbc diff, start ivf, Pulm/ID consulted.  H/o cad s/p stent, stable, cardiac enzyme negative.   H/o htn, lisinopril/hctz held since admission  H/o osa, on cpap at night  Code Status: full  Family Communication: patient and family  Disposition Plan: remain inpatient   Consultants:  Pulm  ID  Procedures:  CTA  Antibiotics:  Rocephin/zithro   Objective: BP 143/77 mmHg  Pulse 105  Temp(Src) 102.1 F (38.9 C) (Oral)  Resp 20  Ht 6' (1.829 m)  Wt 110.36 kg (243 lb 4.8 oz)  BMI 32.99 kg/m2  SpO2 96%  Intake/Output Summary (Last 24 hours) at 07/09/14 1100 Last data filed at 07/09/14 1020  Gross per 24 hour  Intake   1380 ml  Output   4025 ml  Net  -2645 ml   Filed Weights   07/07/14 1216 07/08/14 0452 07/09/14 0435  Weight: 108.999 kg (240 lb 4.8 oz) 110.496 kg (243 lb 9.6 oz) 110.36 kg (243 lb 4.8 oz)    Exam:   General:  NAD  Cardiovascular: RRR  Respiratory: good airenty, mild wheezes at bases  Abdomen: Soft/ND/NT, positive BS  Musculoskeletal: No Edema, right knee pain  Neuro: aaox3  Data Reviewed: Basic Metabolic Panel:  Recent Labs Lab 07/07/14 0850 07/07/14 1423 07/08/14 0027 07/09/14 0430  NA 140  --  137 138  K 3.4*  --  3.8 3.8  CL 105  --  105 104  CO2 25  --  25 24  GLUCOSE 157*  --  128* 119*  BUN 9  --  9 9  CREATININE 1.17  --  1.21 1.22  CALCIUM 9.1  --  8.6 8.8  MG   --  1.8  --  1.7   Liver Function Tests:  Recent Labs Lab 07/08/14 0027  AST 15  ALT 13  ALKPHOS 46  BILITOT 1.0  PROT 6.7  ALBUMIN 3.6   No results for input(s): LIPASE, AMYLASE in the last 168 hours. No results for input(s): AMMONIA in the last 168 hours. CBC:  Recent Labs Lab 07/07/14 0850 07/08/14 0027 07/09/14 0430  WBC 23.8* 24.1* 20.4*  HGB 12.5* 12.1* 11.0*  HCT 35.1* 33.4* 30.6*  MCV 82.0 82.1 80.1  PLT 356 376 352   Cardiac Enzymes:    Recent Labs Lab 07/07/14 1423 07/07/14 1818 07/08/14 0027  TROPONINI <0.03 <0.03 <0.03   BNP (last 3 results) No results for input(s): BNP in the last 8760 hours.  ProBNP (last 3 results) No results for input(s): PROBNP in the last 8760 hours.  CBG: No results for input(s): GLUCAP in the last 168 hours.  Recent Results (from the past 240 hour(s))  Blood culture (routine x 2)     Status: None (Preliminary result)   Collection Time: 07/07/14 10:01 AM  Result Value Ref Range Status   Specimen Description BLOOD LEFT ANTECUBITAL  Final   Special Requests BOTTLES DRAWN AEROBIC AND ANAEROBIC 5CC  Final   Culture  Final           BLOOD CULTURE RECEIVED NO GROWTH TO DATE CULTURE WILL BE HELD FOR 5 DAYS BEFORE ISSUING A FINAL NEGATIVE REPORT Performed at [facility]    Report Status PENDING  Incomplete  Blood culture (routine x 2)     Status: None (Preliminary result)   Collection Time: 07/07/14 10:13 AM  Result Value Ref Range Status   Specimen Description BLOOD RIGHT ANTECUBITAL  Final   Special Requests BOTTLES DRAWN AEROBIC AND ANAEROBIC 5CC  Final   Culture   Final           BLOOD CULTURE RECEIVED NO GROWTH TO DATE CULTURE WILL BE HELD FOR 5 DAYS BEFORE ISSUING A FINAL NEGATIVE REPORT Performed at [facility]    Report Status PENDING  Incomplete  Culture, sputum-assessment     Status: None   Collection Time: 07/09/14  1:20 AM  Result Value Ref Range Status   Specimen Description SPUTUM   Final   Special Requests NONE  Final   Sputum evaluation   Final    MICROSCOPIC FINDINGS SUGGEST THAT THIS SPECIMEN IS NOT REPRESENTATIVE OF LOWER RESPIRATORY SECRETIONS. PLEASE RECOLLECT. RESULT CALLED TO, READ BACK BY AND VERIFIED WITH: NOTIFIED L WNOT,RN 07/09/14 0309 BY RHOLMES   Report Status 07/09/2014 FINAL  Final     Studies: No results found.  Scheduled Meds: . aspirin EC  81 mg Oral Daily  . azithromycin  500 mg Intravenous Q24H  . cefTRIAXone (ROCEPHIN)  IV  1 g Intravenous Q24H  . docusate sodium  100 mg Oral BID  . enoxaparin (LOVENOX) injection  40 mg Subcutaneous Q24H  . fluticasone  2 spray Each Nare Daily  . guaiFENesin  1,200 mg Oral BID  . metoprolol succinate  50 mg Oral Daily  . montelukast  10 mg Oral Daily  . sodium chloride  3 mL Intravenous Q12H    Continuous Infusions: . sodium chloride 75 mL/hr at 07/09/14 0900     Time spent: 16mins  Shaquan Missey MD, PhD  [facility] Pager [phone]. If 7PM-7AM, please contact night-coverage at [email or web address], password [facility] 07/09/2014, 11:00 AM  LOS: 2 days

## 2014-07-10 DIAGNOSIS — D72829 Elevated white blood cell count, unspecified: Secondary | ICD-10-CM | POA: Insufficient documentation

## 2014-07-10 LAB — COMPREHENSIVE METABOLIC PANEL
ALT: 12 U/L (ref 0–53)
AST: 15 U/L (ref 0–37)
Albumin: 3.3 g/dL — ABNORMAL LOW (ref 3.5–5.2)
Alkaline Phosphatase: 39 U/L (ref 39–117)
Anion gap: 9 (ref 5–15)
BUN: 11 mg/dL (ref 6–23)
CO2: 25 mmol/L (ref 19–32)
Calcium: 8.6 mg/dL (ref 8.4–10.5)
Chloride: 104 mmol/L (ref 96–112)
Creatinine, Ser: 1.43 mg/dL — ABNORMAL HIGH (ref 0.50–1.35)
GFR calc Af Amer: 68 mL/min — ABNORMAL LOW (ref >90)
GFR calc non Af Amer: 58 mL/min — ABNORMAL LOW (ref >90)
Glucose, Bld: 137 mg/dL — ABNORMAL HIGH (ref 70–99)
Potassium: 3.7 mmol/L (ref 3.5–5.1)
Sodium: 138 mmol/L (ref 135–145)
Total Bilirubin: 1.3 mg/dL — ABNORMAL HIGH (ref 0.3–1.2)
Total Protein: 6.5 g/dL (ref 6.0–8.3)

## 2014-07-10 LAB — CBC
HCT: 28.9 % — ABNORMAL LOW (ref 39.0–52.0)
Hemoglobin: 10.4 g/dL — ABNORMAL LOW (ref 13.0–17.0)
MCH: 28.7 pg (ref 26.0–34.0)
MCHC: 36 g/dL (ref 30.0–36.0)
MCV: 79.6 fL (ref 78.0–100.0)
Platelets: 357 10*3/uL (ref 150–400)
RBC: 3.63 MIL/uL — ABNORMAL LOW (ref 4.22–5.81)
RDW: 16.9 % — ABNORMAL HIGH (ref 11.5–15.5)
WBC: 24.2 10*3/uL — ABNORMAL HIGH (ref 4.0–10.5)

## 2014-07-10 MED ORDER — PANTOPRAZOLE SODIUM 40 MG PO TBEC
40.0000 mg | DELAYED_RELEASE_TABLET | Freq: Every day | ORAL | Status: DC
  Administered 2014-07-10: 40 mg via ORAL
  Filled 2014-07-10: qty 1

## 2014-07-10 MED ORDER — OXYCODONE HCL 5 MG PO TABS
5.0000 mg | ORAL_TABLET | ORAL | Status: AC | PRN
  Administered 2014-07-10 – 2014-07-11 (×3): 5 mg via ORAL
  Filled 2014-07-10 (×3): qty 1

## 2014-07-10 MED ORDER — FLORANEX PO PACK
1.0000 g | PACK | Freq: Three times a day (TID) | ORAL | Status: DC
  Administered 2014-07-11 (×3): 1 g via ORAL
  Filled 2014-07-10 (×4): qty 1

## 2014-07-10 NOTE — Progress Notes (Signed)
PROGRESS NOTE  George Erickson [id number] DOB: 15-Jun-1970 DOA: 07/07/2014 PCP: Jonathon Bellows, MD  HPI/Recap of past 24 hours:  Persistent fever, dry cough, also c/o chronic right knee pain   Assessment/Plan: Active Problems:   CAP (community acquired pneumonia)   Left sided chest pain   Essential hypertension   Hypokalemia   Hyperglycemia  CAP , persistent fever , persistent leukocytosis, some improvement of sinus tachycardia, on rocephin/zithro , ,  ctA chest, no PE + bilateral pleural effusion/consolidation. Nonproductive cough, culture unrevealing so far,  D/c ppi, add probiotics. Dysphagia? Consider barium swallow if persist. Consider flagyl if persistent fever. appreciate Pulm/ID input.  H/o cad s/p stent, stable, cardiac enzyme negative.   H/o htn, lisinopril/hctz held since admission  H/o osa, on cpap at night  Code Status: full  Family Communication: patient and family  Disposition Plan: remain inpatient   Consultants:  Pulm  ID  Procedures:  CTA  Antibiotics:  Rocephin/zithro   Objective: BP 140/78 mmHg  Pulse 88  Temp(Src) 99.5 F (37.5 C) (Oral)  Resp 20  Ht 6' (1.829 m)  Wt 109.861 kg (242 lb 3.2 oz)  BMI 32.84 kg/m2  SpO2 95%  Intake/Output Summary (Last 24 hours) at 07/10/14 1749 Last data filed at 07/10/14 1725  Gross per 24 hour  Intake   3000 ml  Output   3450 ml  Net   -450 ml   Filed Weights   07/08/14 0452 07/09/14 0435 07/10/14 0440  Weight: 110.496 kg (243 lb 9.6 oz) 110.36 kg (243 lb 4.8 oz) 109.861 kg (242 lb 3.2 oz)    Exam:   General:  NAD  Cardiovascular: RRR  Respiratory: good airenty, mild wheezes at bases  Abdomen: Soft/ND/NT, positive BS  Musculoskeletal: No Edema, right knee pain  Neuro: aaox3  Data Reviewed: Basic Metabolic Panel:  Recent Labs Lab 07/07/14 0850 07/07/14 1423 07/08/14 0027 07/09/14 0430 07/10/14 0542  NA 140  --  137 138 138  K 3.4*  --  3.8 3.8 3.7  CL 105  --   105 104 104  CO2 25  --  25 24 25   GLUCOSE 157*  --  128* 119* 137*  BUN 9  --  9 9 11   CREATININE 1.17  --  1.21 1.22 1.43*  CALCIUM 9.1  --  8.6 8.8 8.6  MG  --  1.8  --  1.7  --    Liver Function Tests:  Recent Labs Lab 07/08/14 0027 07/10/14 0542  AST 15 15  ALT 13 12  ALKPHOS 46 39  BILITOT 1.0 1.3*  PROT 6.7 6.5  ALBUMIN 3.6 3.3*   No results for input(s): LIPASE, AMYLASE in the last 168 hours. No results for input(s): AMMONIA in the last 168 hours. CBC:  Recent Labs Lab 07/07/14 0850 07/08/14 0027 07/09/14 0430 07/09/14 1244 07/10/14 0542  WBC 23.8* 24.1* 20.4* 21.0* 24.2*  NEUTROABS  --   --   --  15.7*  --   HGB 12.5* 12.1* 11.0* 10.5* 10.4*  HCT 35.1* 33.4* 30.6* 29.9* 28.9*  MCV 82.0 82.1 80.1 81.7 79.6  PLT 356 376 352 358 357   Cardiac Enzymes:    Recent Labs Lab 07/07/14 1423 07/07/14 1818 07/08/14 0027  TROPONINI <0.03 <0.03 <0.03   BNP (last 3 results)  Recent Labs  07/09/14 1244  BNP 15.6    ProBNP (last 3 results) No results for input(s): PROBNP in the last 8760 hours.  CBG: No results for input(s):  GLUCAP in the last 168 hours.  Recent Results (from the past 240 hour(s))  Blood culture (routine x 2)     Status: None (Preliminary result)   Collection Time: 07/07/14 10:01 AM  Result Value Ref Range Status   Specimen Description BLOOD LEFT ANTECUBITAL  Final   Special Requests BOTTLES DRAWN AEROBIC AND ANAEROBIC 5CC  Final   Culture   Final           BLOOD CULTURE RECEIVED NO GROWTH TO DATE CULTURE WILL BE HELD FOR 5 DAYS BEFORE ISSUING A FINAL NEGATIVE REPORT Performed at [facility]    Report Status PENDING  Incomplete  Blood culture (routine x 2)     Status: None (Preliminary result)   Collection Time: 07/07/14 10:13 AM  Result Value Ref Range Status   Specimen Description BLOOD RIGHT ANTECUBITAL  Final   Special Requests BOTTLES DRAWN AEROBIC AND ANAEROBIC 5CC  Final   Culture   Final           BLOOD CULTURE  RECEIVED NO GROWTH TO DATE CULTURE WILL BE HELD FOR 5 DAYS BEFORE ISSUING A FINAL NEGATIVE REPORT Performed at [facility]    Report Status PENDING  Incomplete  Culture, sputum-assessment     Status: None   Collection Time: 07/09/14  1:20 AM  Result Value Ref Range Status   Specimen Description SPUTUM  Final   Special Requests NONE  Final   Sputum evaluation   Final    MICROSCOPIC FINDINGS SUGGEST THAT THIS SPECIMEN IS NOT REPRESENTATIVE OF LOWER RESPIRATORY SECRETIONS. PLEASE RECOLLECT. RESULT CALLED TO, READ BACK BY AND VERIFIED WITH: NOTIFIED L WNOT,RN 07/09/14 0309 BY RHOLMES   Report Status 07/09/2014 FINAL  Final     Studies: Ct Angio Chest Pe W/cm &/or Wo Cm  07/09/2014   CLINICAL DATA:  Chest pain  EXAM: CT ANGIOGRAPHY CHEST WITH CONTRAST  TECHNIQUE: Multidetector CT imaging of the chest was performed using the standard protocol during bolus administration of intravenous contrast. Multiplanar CT image reconstructions and MIPs were obtained to evaluate the vascular anatomy.  CONTRAST:  11mL OMNIPAQUE IOHEXOL 350 MG/ML SOLN  COMPARISON:  Chest CT November 15, 2009; chest radiograph July 07, 2014  FINDINGS: There is no demonstrable pulmonary embolus. There is no thoracic aortic aneurysm or dissection.  There are pleural effusions bilaterally with bibasilar lung consolidation. Consolidation is also noted in the inferior lingula.  There is no demonstrable thoracic adenopathy. The pericardium is not thickened. Thyroid appears normal.  There is left ventricular hypertrophy. There is calcification in the left anterior descending coronary artery.  In the visualized upper abdomen, no lesion is identified.  There are no blastic or lytic bone lesions.  Review of the MIP images confirms the above findings.  IMPRESSION: No demonstrable pulmonary embolus. Bilateral pleural effusions with lateral lower lobe and inferior lingular airspace consolidation. No demonstrable adenopathy. Areas of coronary  artery calcification, more than generally seen in this age group. Mild left ventricular hypertrophy.   Electronically Signed   By: Lowella Grip III M.D.   On: 07/09/2014 10:07    Scheduled Meds: . aspirin EC  81 mg Oral Daily  . azithromycin  500 mg Intravenous Q24H  . cefTRIAXone (ROCEPHIN)  IV  1 g Intravenous Q24H  . docusate sodium  100 mg Oral BID  . enoxaparin (LOVENOX) injection  40 mg Subcutaneous Q24H  . fluticasone  2 spray Each Nare Daily  . guaiFENesin  1,200 mg Oral BID  . [START  ON 07/11/2014] lactobacillus  1 g Oral TID WC  . metoprolol succinate  50 mg Oral Daily  . montelukast  10 mg Oral Daily  . sodium chloride  3 mL Intravenous Q12H    Continuous Infusions:     Time spent: 87mins  Leo Fray MD, PhD  [facility] Pager [phone]. If 7PM-7AM, please contact night-coverage at [email or web address], password [facility] 07/10/2014, 5:49 PM  LOS: 3 days

## 2014-07-10 NOTE — Progress Notes (Signed)
[place] for Infectious Disease  Date of Admission:  07/07/2014  Antibiotics: Azithromycin and ceftriaxone  Subjective: Continues to feel better, though continued pleuritic chest pain  Objective: Temp:  [98.3 F (36.8 C)-102.9 F (39.4 C)] 99.5 F (37.5 C) (04/29 1344) Pulse Rate:  [87-109] 88 (04/29 1344) Resp:  [20] 20 (04/29 1344) BP: (139-140)/(78-94) 140/78 mmHg (04/29 1344) SpO2:  [94 %-95 %] 95 % (04/29 1344) Weight:  [242 lb 3.2 oz (109.861 kg)] 242 lb 3.2 oz (109.861 kg) (04/29 0440)  General: awake, alert, nad Skin: no rashes Lungs: CTAB, no crackles noted Cor: RRR Abdomen: soft, nt, nd   Lab Results Lab Results  Component Value Date   WBC 24.2* 07/10/2014   HGB 10.4* 07/10/2014   HCT 28.9* 07/10/2014   MCV 79.6 07/10/2014   PLT 357 07/10/2014    Lab Results  Component Value Date   CREATININE 1.43* 07/10/2014   BUN 11 07/10/2014   NA 138 07/10/2014   K 3.7 07/10/2014   CL 104 07/10/2014   CO2 25 07/10/2014    Lab Results  Component Value Date   ALT 12 07/10/2014   AST 15 07/10/2014   ALKPHOS 39 07/10/2014   BILITOT 1.3* 07/10/2014      Microbiology: Recent Results (from the past 240 hour(s))  Blood culture (routine x 2)     Status: None (Preliminary result)   Collection Time: 07/07/14 10:01 AM  Result Value Ref Range Status   Specimen Description BLOOD LEFT ANTECUBITAL  Final   Special Requests BOTTLES DRAWN AEROBIC AND ANAEROBIC 5CC  Final   Culture   Final           BLOOD CULTURE RECEIVED NO GROWTH TO DATE CULTURE WILL BE HELD FOR 5 DAYS BEFORE ISSUING A FINAL NEGATIVE REPORT Performed at [facility]    Report Status PENDING  Incomplete  Blood culture (routine x 2)     Status: None (Preliminary result)   Collection Time: 07/07/14 10:13 AM  Result Value Ref Range Status   Specimen Description BLOOD RIGHT ANTECUBITAL  Final   Special Requests BOTTLES DRAWN AEROBIC AND ANAEROBIC 5CC  Final   Culture   Final   BLOOD CULTURE RECEIVED NO GROWTH TO DATE CULTURE WILL BE HELD FOR 5 DAYS BEFORE ISSUING A FINAL NEGATIVE REPORT Performed at [facility]    Report Status PENDING  Incomplete  Culture, sputum-assessment     Status: None   Collection Time: 07/09/14  1:20 AM  Result Value Ref Range Status   Specimen Description SPUTUM  Final   Special Requests NONE  Final   Sputum evaluation   Final    MICROSCOPIC FINDINGS SUGGEST THAT THIS SPECIMEN IS NOT REPRESENTATIVE OF LOWER RESPIRATORY SECRETIONS. PLEASE RECOLLECT. RESULT CALLED TO, READ BACK BY AND VERIFIED WITH: NOTIFIED L WNOT,RN 07/09/14 0309 BY RHOLMES   Report Status 07/09/2014 FINAL  Final    Studies/Results: Ct Angio Chest Pe W/cm &/or Wo Cm  07/09/2014   CLINICAL DATA:  Chest pain  EXAM: CT ANGIOGRAPHY CHEST WITH CONTRAST  TECHNIQUE: Multidetector CT imaging of the chest was performed using the standard protocol during bolus administration of intravenous contrast. Multiplanar CT image reconstructions and MIPs were obtained to evaluate the vascular anatomy.  CONTRAST:  134mL OMNIPAQUE IOHEXOL 350 MG/ML SOLN  COMPARISON:  Chest CT November 15, 2009; chest radiograph July 07, 2014  FINDINGS: There is no demonstrable pulmonary embolus. There is no thoracic aortic aneurysm or dissection.  There are pleural  effusions bilaterally with bibasilar lung consolidation. Consolidation is also noted in the inferior lingula.  There is no demonstrable thoracic adenopathy. The pericardium is not thickened. Thyroid appears normal.  There is left ventricular hypertrophy. There is calcification in the left anterior descending coronary artery.  In the visualized upper abdomen, no lesion is identified.  There are no blastic or lytic bone lesions.  Review of the MIP images confirms the above findings.  IMPRESSION: No demonstrable pulmonary embolus. Bilateral pleural effusions with lateral lower lobe and inferior lingular airspace consolidation. No demonstrable  adenopathy. Areas of coronary artery calcification, more than generally seen in this age group. Mild left ventricular hypertrophy.   Electronically Signed   By: Lowella Grip III M.D.   On: 07/09/2014 10:07    Assessment/Plan:  1) CAP - also with some dysphagia but does not typically aspirate by history.  Some concerns for aspiration based on CT appearance.  Continues to have fever, though none this am, and leukocytosis.  Slow improvement as expected.  Possible aspiration, could stop azithromycin and add flagyl if worsens clnically.    -protonix added which increases C diff risk, was not on it prior to admission and is not on steroids.  He did not relay any reflux.  Does this need to be continued?  -Respiratory virus panel sent but will not return in a timely manner to affect any clinic decision-making.  Flu already sent and is negative.   Scharlene Gloss, [place] for Infectious Disease [place] [email or web address] [phone] pager   [phone] cell 07/10/2014, 3:27 PM

## 2014-07-10 NOTE — Consult Note (Signed)
Name: George Erickson MRN: [id number] DOB: 1970/10/12    ADMISSION DATE:  07/07/2014 CONSULTATION DATE:  4/28  REFERRING MD :  Erlinda Hong ([facility])   CHIEF COMPLAINT:  PNA   BRIEF PATIENT DESCRIPTION: 44yo male with hx OSA on CPAP, CAD admitted 4/26 by [facility] with CAP and treated with Rocephin/Azithro.   Had ongoing dyspnea, high fevers and PCCM consulted.   SIGNIFICANT EVENTS    STUDIES:  CTA chest 4/28>>> NEG PE, small bilat effusions and B LL consolidation    HISTORY OF PRESENT ILLNESS:  44yo male with hx OSA, CAD s/p MI and stent placement 2011.  Works for [employer] in [place].  [place] 4/26 with 3 day hx dull L sided pleuritic chest pain, diaphoresis and cough.  His chest pain initially worsened and felt similar to his previous MI so his daughter called 911 and he presented to [facility] ER.  In ER troponins were negative (had negative stress test at [facility] last year and clean cath 3 years ago) and was found to be febrile, wbc 24 and CXR with LML infiltrate.  [facility] admitted with CAP and he was treated with Rocephin/Axithro.  Despite this he has continued to have fevers, dyspnea and PCCM consulted 4/28.   Currently feeling a little better but does still c/o malaise, myalgias, chills, pleuritic chest pain, dry cough.   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  No recent weight loss, night sweats, hemoptysis Has been out of work recently r/t back injury/pain    PAST MEDICAL HISTORY :   has a past medical history of Hypertension; MI (myocardial infarction); Coronary artery disease; Arthritis; and Sleep apnea.  has past surgical history that includes Coronary stent placement (2011); Nasal sinus surgery; and Hernia repair. Prior to Admission medications   Medication Sig Start Date End Date Taking? Authorizing Provider    aspirin EC 81 MG tablet Take 81 mg by mouth daily.   Yes Historical Provider, MD  fluticasone (FLONASE) 50 MCG/ACT nasal spray Place 2 sprays into the nose daily.   Yes Historical Provider, MD  ibuprofen (ADVIL,MOTRIN) 200 MG tablet Take 200 mg by mouth every 6 (six) hours as needed for moderate pain.   Yes Historical Provider, MD  lisinopril-hydrochlorothiazide (PRINZIDE,ZESTORETIC) 10-12.5 MG per tablet Take 1 tablet by mouth daily.   Yes Historical Provider, MD  metoprolol succinate (TOPROL-XL) 50 MG 24 hr tablet Take 50 mg by mouth daily.   Yes Historical Provider, MD  montelukast (SINGULAIR) 10 MG tablet Take 10 mg by mouth daily.   Yes Historical Provider, MD   Allergies  Allergen Reactions  . Niacin And Related Itching    FAMILY HISTORY:  family history is not on file. SOCIAL HISTORY:  reports that he has never smoked. He has never used smokeless tobacco. He reports that he does not drink alcohol or use illicit drugs.  REVIEW OF SYSTEMS:   As per HPI - All other systems reviewed and were neg.    SUBJECTIVE:   VITAL SIGNS: Temp:  [98.3 F (36.8 C)-102.9 F (39.4 C)] 98.3 F (36.8 C) (04/29 0440) Pulse Rate:  [86-109] 87 (04/29 0440) Resp:  [20] 20 (04/29 0440) BP: (135-140)/(81-94) 139/94 mmHg (04/29 0440) SpO2:  [94 %-95 %] 95 % (04/29 0440) Weight:  [242 lb 3.2 oz (109.861 kg)] 242  lb 3.2 oz (109.861 kg) (04/29 0440)  PHYSICAL EXAMINATION: General:  Very pleasant, wdwn male, appears older than stated age, NAD  Neuro:  Awake, alert, appropriate, MAE  HEENT:  Mm moist, no JVD  Cardiovascular:  s1s2 rrr Lungs:  resps even non labored on RA, CTA posteriorly   Abdomen:  Soft, non tender, +bs  Musculoskeletal:  Warm and dry, no edema    Recent Labs Lab 07/08/14 0027 07/09/14 0430 07/10/14 0542  NA 137 138 138  K 3.8 3.8 3.7  CL 105 104 104  CO2 25 24 25   BUN 9 9 11   CREATININE 1.21 1.22 1.43*  GLUCOSE 128* 119* 137*    Recent Labs Lab 07/09/14 0430  07/09/14 1244 07/10/14 0542  HGB 11.0* 10.5* 10.4*  HCT 30.6* 29.9* 28.9*  WBC 20.4* 21.0* 24.2*  PLT 352 358 357   Ct Angio Chest Pe W/cm &/or Wo Cm  07/09/2014   CLINICAL DATA:  Chest pain  EXAM: CT ANGIOGRAPHY CHEST WITH CONTRAST  TECHNIQUE: Multidetector CT imaging of the chest was performed using the standard protocol during bolus administration of intravenous contrast. Multiplanar CT image reconstructions and MIPs were obtained to evaluate the vascular anatomy.  CONTRAST:  110mL OMNIPAQUE IOHEXOL 350 MG/ML SOLN  COMPARISON:  Chest CT November 15, 2009; chest radiograph July 07, 2014  FINDINGS: There is no demonstrable pulmonary embolus. There is no thoracic aortic aneurysm or dissection.  There are pleural effusions bilaterally with bibasilar lung consolidation. Consolidation is also noted in the inferior lingula.  There is no demonstrable thoracic adenopathy. The pericardium is not thickened. Thyroid appears normal.  There is left ventricular hypertrophy. There is calcification in the left anterior descending coronary artery.  In the visualized upper abdomen, no lesion is identified.  There are no blastic or lytic bone lesions.  Review of the MIP images confirms the above findings.  IMPRESSION: No demonstrable pulmonary embolus. Bilateral pleural effusions with lateral lower lobe and inferior lingular airspace consolidation. No demonstrable adenopathy. Areas of coronary artery calcification, more than generally seen in this age group. Mild left ventricular hypertrophy.   Electronically Signed   By: Lowella Grip III M.D.   On: 07/09/2014 10:07    ASSESSMENT / PLAN:  CAP - Fever curve going down 4/29. ? viral illness in addition to bibasilar PNA.  Flu, HIV neg. Does report difficulty swallowing at times. No obvious environmental or occupational exposures.  No recent travel. NEVER smoker.  Respiratory status stable, comfortable on RA.   OSA   REC -  Agree with ID consult, would consider  anaerobic coverage if worsening or if evidence lung abscess on imaging Pulmonary hygiene - add IS, flutter  Sputum culture if able  Consider swallow eval v barium swallow  Urine strep, legionella  Check resp virus panel 4/29 Continue qhs CPAP Opt sleep study Add PPI 4/29. PCCM will see again Monday.  Richardson Landry Minor ACNP Maryanna Shape PCCM Pager [phone] till 3 pm If no answer page [phone] 07/10/2014, 11:17 AM   Attending Note:  I have examined patient, reviewed labs, studies and notes. In particular I reviewed the CT scan from 4/28 myself. I have discussed the case with S Minor, and I agree with the data and plans as I've amended above. There is a subtle hypolucency in his RLL consolidated lung on Ct scan that needs to be followed to insure no evolving lung necrosis or abscess. His dysphagia could put him at risk for aspiration and an anaerobic infxn. If  his CXR evolves or he remains febrile would discuss w ID the merits of covering anaerobes. We will check on him on Monday. Call if we can help sooner.   Baltazar Apo, MD, PhD 07/10/2014, 1:38 PM [facility] [phone] or if no answer [phone]

## 2014-07-11 ENCOUNTER — Inpatient Hospital Stay (HOSPITAL_COMMUNITY): Payer: BLUE CROSS/BLUE SHIELD

## 2014-07-11 DIAGNOSIS — M25512 Pain in left shoulder: Secondary | ICD-10-CM | POA: Insufficient documentation

## 2014-07-11 DIAGNOSIS — R1310 Dysphagia, unspecified: Secondary | ICD-10-CM | POA: Insufficient documentation

## 2014-07-11 DIAGNOSIS — M25561 Pain in right knee: Secondary | ICD-10-CM | POA: Insufficient documentation

## 2014-07-11 LAB — COMPREHENSIVE METABOLIC PANEL
ALT: 15 U/L (ref 0–53)
AST: 13 U/L (ref 0–37)
Albumin: 3.1 g/dL — ABNORMAL LOW (ref 3.5–5.2)
Alkaline Phosphatase: 40 U/L (ref 39–117)
Anion gap: 8 (ref 5–15)
BUN: 11 mg/dL (ref 6–23)
CO2: 26 mmol/L (ref 19–32)
Calcium: 8.6 mg/dL (ref 8.4–10.5)
Chloride: 105 mmol/L (ref 96–112)
Creatinine, Ser: 1.22 mg/dL (ref 0.50–1.35)
GFR calc Af Amer: 82 mL/min — ABNORMAL LOW (ref >90)
GFR calc non Af Amer: 71 mL/min — ABNORMAL LOW (ref >90)
Glucose, Bld: 146 mg/dL — ABNORMAL HIGH (ref 70–99)
Potassium: 3.6 mmol/L (ref 3.5–5.1)
Sodium: 139 mmol/L (ref 135–145)
Total Bilirubin: 1.3 mg/dL — ABNORMAL HIGH (ref 0.3–1.2)
Total Protein: 6.5 g/dL (ref 6.0–8.3)

## 2014-07-11 LAB — T4, FREE: Free T4: 1.09 ng/dL (ref 0.80–1.80)

## 2014-07-11 LAB — LACTIC ACID, PLASMA: Lactic Acid, Venous: 0.6 mmol/L (ref 0.5–2.0)

## 2014-07-11 LAB — CBC
HCT: 26.6 % — ABNORMAL LOW (ref 39.0–52.0)
Hemoglobin: 9.8 g/dL — ABNORMAL LOW (ref 13.0–17.0)
MCH: 29 pg (ref 26.0–34.0)
MCHC: 36.8 g/dL — ABNORMAL HIGH (ref 30.0–36.0)
MCV: 78.7 fL (ref 78.0–100.0)
Platelets: 336 10*3/uL (ref 150–400)
RBC: 3.38 MIL/uL — ABNORMAL LOW (ref 4.22–5.81)
RDW: 17.1 % — ABNORMAL HIGH (ref 11.5–15.5)
WBC: 21.7 10*3/uL — ABNORMAL HIGH (ref 4.0–10.5)

## 2014-07-11 MED ORDER — HYDROCHLOROTHIAZIDE 12.5 MG PO CAPS
12.5000 mg | ORAL_CAPSULE | Freq: Every day | ORAL | Status: DC
  Administered 2014-07-11 – 2014-07-12 (×2): 12.5 mg via ORAL
  Filled 2014-07-11 (×2): qty 1

## 2014-07-11 MED ORDER — LISINOPRIL-HYDROCHLOROTHIAZIDE 10-12.5 MG PO TABS: 1.0000 | ORAL_TABLET | Freq: Every day | ORAL | Status: DC

## 2014-07-11 MED ORDER — LISINOPRIL 10 MG PO TABS
10.0000 mg | ORAL_TABLET | Freq: Every day | ORAL | Status: DC
  Administered 2014-07-11 – 2014-07-12 (×2): 10 mg via ORAL
  Filled 2014-07-11 (×2): qty 1

## 2014-07-11 MED ORDER — IPRATROPIUM-ALBUTEROL 0.5-2.5 (3) MG/3ML IN SOLN: 3.0000 mL | Freq: Four times a day (QID) | RESPIRATORY_TRACT | Status: DC | PRN

## 2014-07-11 MED ORDER — FLORANEX PO PACK
1.0000 g | PACK | Freq: Three times a day (TID) | ORAL | Status: DC
  Administered 2014-07-12 – 2014-07-14 (×7): 1 g via ORAL
  Filled 2014-07-11 (×10): qty 1

## 2014-07-11 MED ORDER — IPRATROPIUM-ALBUTEROL 0.5-2.5 (3) MG/3ML IN SOLN
3.0000 mL | Freq: Four times a day (QID) | RESPIRATORY_TRACT | Status: DC
  Administered 2014-07-11 (×2): 3 mL via RESPIRATORY_TRACT
  Filled 2014-07-11 (×2): qty 3

## 2014-07-11 NOTE — Progress Notes (Addendum)
[facility] [place] [facility] for Infectious Disease  Date of Admission:  07/07/2014  Antibiotics: Azithromycin and ceftriaxone day #5  Subjective: Continues to feel better, but tmax 101.7 yesterday, though afebrile this am. He states he notices his fever and diaphoresis are associated with azithro infusion  Objective: Temp:  [98.4 F (36.9 C)-101.7 F (38.7 C)] 98.4 F (36.9 C) (04/30 0430) Pulse Rate:  [88-108] 89 (04/30 0430) Resp:  [20] 20 (04/30 0430) BP: (140-151)/(56-99) 151/56 mmHg (04/30 0900) SpO2:  [95 %-98 %] 98 % (04/30 0430) Weight:  [242 lb 3.2 oz (109.861 kg)] 242 lb 3.2 oz (109.861 kg) (04/30 0430)  General: awake, alert, nad sitting at edge of bed Skin: no rashes Lungs: CTAB, no crackles noted, no wheezing Cor: RRR, no g/m/r Abdomen: soft, nt, nd   Lab Results Lab Results  Component Value Date   WBC 21.7* 07/11/2014   HGB 9.8* 07/11/2014   HCT 26.6* 07/11/2014   MCV 78.7 07/11/2014   PLT 336 07/11/2014    Lab Results  Component Value Date   CREATININE 1.22 07/11/2014   BUN 11 07/11/2014   NA 139 07/11/2014   K 3.6 07/11/2014   CL 105 07/11/2014   CO2 26 07/11/2014    Lab Results  Component Value Date   ALT 15 07/11/2014   AST 13 07/11/2014   ALKPHOS 40 07/11/2014   BILITOT 1.3* 07/11/2014      Microbiology: Recent Results (from the past 240 hour(s))  Blood culture (routine x 2)     Status: None (Preliminary result)   Collection Time: 07/07/14 10:01 AM  Result Value Ref Range Status   Specimen Description BLOOD LEFT ANTECUBITAL  Final   Special Requests BOTTLES DRAWN AEROBIC AND ANAEROBIC 5CC  Final   Culture   Final           BLOOD CULTURE RECEIVED NO GROWTH TO DATE CULTURE WILL BE HELD FOR 5 DAYS BEFORE ISSUING A FINAL NEGATIVE REPORT Performed at [facility]    Report Status PENDING  Incomplete  Blood culture (routine x 2)     Status: None (Preliminary result)   Collection Time: 07/07/14 10:13 AM  Result Value Ref Range Status   Specimen Description BLOOD RIGHT ANTECUBITAL  Final   Special Requests BOTTLES DRAWN AEROBIC AND ANAEROBIC 5CC  Final   Culture   Final           BLOOD CULTURE RECEIVED NO GROWTH TO DATE CULTURE WILL BE HELD FOR 5 DAYS BEFORE ISSUING A FINAL NEGATIVE REPORT Performed at [facility]    Report Status PENDING  Incomplete  Culture, sputum-assessment     Status: None   Collection Time: 07/09/14  1:20 AM  Result Value Ref Range Status   Specimen Description SPUTUM  Final   Special Requests NONE  Final   Sputum evaluation   Final    MICROSCOPIC FINDINGS SUGGEST THAT THIS SPECIMEN IS NOT REPRESENTATIVE OF LOWER RESPIRATORY SECRETIONS. PLEASE RECOLLECT. RESULT CALLED TO, READ BACK BY AND VERIFIED WITH: NOTIFIED L WNOT,RN 07/09/14 0309 BY RHOLMES   Report Status 07/09/2014 FINAL  Final    Studies/Results: No results found.  Assessment/Plan:  1) CAP -   Some concerns for aspiration based on CT appearance.  Continues to have fever last night, though none this am, and leukocytosis.  Slow improvement as expected.  If still febrile tonight, would switch to unasyn, and d/c azithromycin and ceftriaxone. This would be last day of azithromycin (long 1/2 life)  2) fevers =  initially thought to be due to pneumonia process, possibly could have drug fever to azithromycin. Giving last dose today, will observe  -protonix added which increases C diff risk, was not on it prior to admission and is not on steroids.  He did not relay any reflux.  Would d/c protonix  3) leukocytosis = slow to resolve, will repeat cbc in the am   Carlyle Basques, [place] [facility] [place] [email or web address] [phone] pager   [phone] cell 07/11/2014, 1:20 PM

## 2014-07-11 NOTE — Progress Notes (Signed)
Pt is able to place himself on home CPAP when ready.

## 2014-07-11 NOTE — Progress Notes (Signed)
PROGRESS NOTE  George Erickson [id number] DOB: 1970/08/16 DOA: 07/07/2014 PCP: Jonathon Bellows, MD  HPI/Recap of past 24 hours:   Concerned about fever might related to zithromax infusion. No productive cough, spike fever last night 101.7, c/o left shoulder pain, right knee pain.   Assessment/Plan: Active Problems:   CAP (community acquired pneumonia)   Left sided chest pain   Essential hypertension   Hypokalemia   Hyperglycemia   Leukocytosis  CAP ,   -CTA chest, no PE + bilateral pleural effusion/consolidation. -persistent fever , persistent leukocytosis, sinus tachycardia resolved -on rocephin, d/c zithromycin due to concerning fever might be related to zithro infusion. -appreciate Pulm/ID input.  Dysphagia? barium swallow ordered.   H/o cad s/p stent, stable, cardiac enzyme negative.   H/o htn, lisinopril/hctz held since admission, restart on 4/30. Continue betablocker.  H/o osa, on cpap at night  Right knee pain/left shoulder pain, likely get shoulder and knee x ray. Will check ana/rf/uric acid. Ibuprofen for now.  Suppressed tsh, check free t4.   Code Status: full  Family Communication: patient   Disposition Plan: remain inpatient   Consultants:  Pulm  ID  Procedures:  CTA  Antibiotics:  Rocephin from admission  zithromax from admission to 4/30 due to concerns that fever might be caused by it.   Objective: BP 151/56 mmHg  Pulse 89  Temp(Src) 98.4 F (36.9 C) (Oral)  Resp 20  Ht 6' (1.829 m)  Wt 109.861 kg (242 lb 3.2 oz)  BMI 32.84 kg/m2  SpO2 98%  Intake/Output Summary (Last 24 hours) at 07/11/14 1407 Last data filed at 07/11/14 1320  Gross per 24 hour  Intake   1060 ml  Output   1725 ml  Net   -665 ml   Filed Weights   07/09/14 0435 07/10/14 0440 07/11/14 0430  Weight: 110.36 kg (243 lb 4.8 oz) 109.861 kg (242 lb 3.2 oz) 109.861 kg (242 lb 3.2 oz)    Exam:   General:  NAD  Cardiovascular: RRR  Respiratory: good  airenty, mild wheezes at bases  Abdomen: Soft/ND/NT, positive BS  Musculoskeletal: No Edema, right knee pain, mild effusion, no erythema, left should pain, limited range of motion.  Neuro: aaox3  Data Reviewed: Basic Metabolic Panel:  Recent Labs Lab 07/07/14 0850 07/07/14 1423 07/08/14 0027 07/09/14 0430 07/10/14 0542 07/11/14 0549  NA 140  --  137 138 138 139  K 3.4*  --  3.8 3.8 3.7 3.6  CL 105  --  105 104 104 105  CO2 25  --  25 24 25 26   GLUCOSE 157*  --  128* 119* 137* 146*  BUN 9  --  9 9 11 11   CREATININE 1.17  --  1.21 1.22 1.43* 1.22  CALCIUM 9.1  --  8.6 8.8 8.6 8.6  MG  --  1.8  --  1.7  --   --    Liver Function Tests:  Recent Labs Lab 07/08/14 0027 07/10/14 0542 07/11/14 0549  AST 15 15 13   ALT 13 12 15   ALKPHOS 46 39 40  BILITOT 1.0 1.3* 1.3*  PROT 6.7 6.5 6.5  ALBUMIN 3.6 3.3* 3.1*   No results for input(s): LIPASE, AMYLASE in the last 168 hours. No results for input(s): AMMONIA in the last 168 hours. CBC:  Recent Labs Lab 07/08/14 0027 07/09/14 0430 07/09/14 1244 07/10/14 0542 07/11/14 0549  WBC 24.1* 20.4* 21.0* 24.2* 21.7*  NEUTROABS  --   --  15.7*  --   --  HGB 12.1* 11.0* 10.5* 10.4* 9.8*  HCT 33.4* 30.6* 29.9* 28.9* 26.6*  MCV 82.1 80.1 81.7 79.6 78.7  PLT 376 352 358 357 336   Cardiac Enzymes:    Recent Labs Lab 07/07/14 1423 07/07/14 1818 07/08/14 0027  TROPONINI <0.03 <0.03 <0.03   BNP (last 3 results)  Recent Labs  07/09/14 1244  BNP 15.6    ProBNP (last 3 results) No results for input(s): PROBNP in the last 8760 hours.  CBG: No results for input(s): GLUCAP in the last 168 hours.  Recent Results (from the past 240 hour(s))  Blood culture (routine x 2)     Status: None (Preliminary result)   Collection Time: 07/07/14 10:01 AM  Result Value Ref Range Status   Specimen Description BLOOD LEFT ANTECUBITAL  Final   Special Requests BOTTLES DRAWN AEROBIC AND ANAEROBIC 5CC  Final   Culture   Final            BLOOD CULTURE RECEIVED NO GROWTH TO DATE CULTURE WILL BE HELD FOR 5 DAYS BEFORE ISSUING A FINAL NEGATIVE REPORT Performed at [facility]    Report Status PENDING  Incomplete  Blood culture (routine x 2)     Status: None (Preliminary result)   Collection Time: 07/07/14 10:13 AM  Result Value Ref Range Status   Specimen Description BLOOD RIGHT ANTECUBITAL  Final   Special Requests BOTTLES DRAWN AEROBIC AND ANAEROBIC 5CC  Final   Culture   Final           BLOOD CULTURE RECEIVED NO GROWTH TO DATE CULTURE WILL BE HELD FOR 5 DAYS BEFORE ISSUING A FINAL NEGATIVE REPORT Performed at [facility]    Report Status PENDING  Incomplete  Culture, sputum-assessment     Status: None   Collection Time: 07/09/14  1:20 AM  Result Value Ref Range Status   Specimen Description SPUTUM  Final   Special Requests NONE  Final   Sputum evaluation   Final    MICROSCOPIC FINDINGS SUGGEST THAT THIS SPECIMEN IS NOT REPRESENTATIVE OF LOWER RESPIRATORY SECRETIONS. PLEASE RECOLLECT. RESULT CALLED TO, READ BACK BY AND VERIFIED WITH: NOTIFIED L WNOT,RN 07/09/14 0309 BY RHOLMES   Report Status 07/09/2014 FINAL  Final     Studies: No results found.  Scheduled Meds: . aspirin EC  81 mg Oral Daily  . cefTRIAXone (ROCEPHIN)  IV  1 g Intravenous Q24H  . docusate sodium  100 mg Oral BID  . enoxaparin (LOVENOX) injection  40 mg Subcutaneous Q24H  . fluticasone  2 spray Each Nare Daily  . guaiFENesin  1,200 mg Oral BID  . hydrochlorothiazide  12.5 mg Oral Daily  . ipratropium-albuterol  3 mL Nebulization Q6H  . lactobacillus  1 g Oral TID WC  . lisinopril  10 mg Oral Daily  . metoprolol succinate  50 mg Oral Daily  . montelukast  10 mg Oral Daily  . sodium chloride  3 mL Intravenous Q12H    Continuous Infusions:     Time spent: 51mins  Kourtlyn Charlet MD, PhD  [facility] Pager [phone]. If 7PM-7AM, please contact night-coverage at [email or web address], password [facility] 07/11/2014, 2:07 PM  LOS: 4 days

## 2014-07-12 LAB — CBC WITH DIFFERENTIAL/PLATELET
Basophils Absolute: 0.2 10*3/uL — ABNORMAL HIGH (ref 0.0–0.1)
Basophils Relative: 1 % (ref 0–1)
Eosinophils Absolute: 0.4 10*3/uL (ref 0.0–0.7)
Eosinophils Relative: 2 % (ref 0–5)
HCT: 27.3 % — ABNORMAL LOW (ref 39.0–52.0)
Hemoglobin: 9.8 g/dL — ABNORMAL LOW (ref 13.0–17.0)
Lymphocytes Relative: 7 % — ABNORMAL LOW (ref 12–46)
Lymphs Abs: 1.4 10*3/uL (ref 0.7–4.0)
MCH: 28.2 pg (ref 26.0–34.0)
MCHC: 35.9 g/dL (ref 30.0–36.0)
MCV: 78.7 fL (ref 78.0–100.0)
Monocytes Absolute: 2.4 10*3/uL — ABNORMAL HIGH (ref 0.1–1.0)
Monocytes Relative: 12 % (ref 3–12)
Neutro Abs: 15.9 10*3/uL — ABNORMAL HIGH (ref 1.7–7.7)
Neutrophils Relative %: 78 % — ABNORMAL HIGH (ref 43–77)
Platelets: 416 10*3/uL — ABNORMAL HIGH (ref 150–400)
RBC: 3.47 MIL/uL — ABNORMAL LOW (ref 4.22–5.81)
RDW: 17.6 % — ABNORMAL HIGH (ref 11.5–15.5)
WBC: 20.3 10*3/uL — ABNORMAL HIGH (ref 4.0–10.5)

## 2014-07-12 LAB — GRAM STAIN

## 2014-07-12 LAB — RESPIRATORY VIRUS PANEL
Adenovirus: NEGATIVE
Influenza A: NEGATIVE
Influenza B: NEGATIVE
Metapneumovirus: NEGATIVE
Parainfluenza 1: NEGATIVE
Parainfluenza 2: NEGATIVE
Parainfluenza 3: NEGATIVE
Respiratory Syncytial Virus A: NEGATIVE
Respiratory Syncytial Virus B: NEGATIVE
Rhinovirus: NEGATIVE

## 2014-07-12 LAB — COMPREHENSIVE METABOLIC PANEL
ALT: 19 U/L (ref 17–63)
AST: 16 U/L (ref 15–41)
Albumin: 3.1 g/dL — ABNORMAL LOW (ref 3.5–5.0)
Alkaline Phosphatase: 46 U/L (ref 38–126)
Anion gap: 12 (ref 5–15)
BUN: 10 mg/dL (ref 6–20)
CO2: 24 mmol/L (ref 22–32)
Calcium: 8.7 mg/dL — ABNORMAL LOW (ref 8.9–10.3)
Chloride: 102 mmol/L (ref 101–111)
Creatinine, Ser: 1.32 mg/dL — ABNORMAL HIGH (ref 0.61–1.24)
GFR calc Af Amer: >60 mL/min (ref >60)
GFR calc non Af Amer: >60 mL/min (ref >60)
Glucose, Bld: 120 mg/dL — ABNORMAL HIGH (ref 70–99)
Potassium: 3.5 mmol/L (ref 3.5–5.1)
Sodium: 138 mmol/L (ref 135–145)
Total Bilirubin: 1.2 mg/dL (ref 0.3–1.2)
Total Protein: 6.7 g/dL (ref 6.5–8.1)

## 2014-07-12 LAB — SYNOVIAL CELL COUNT + DIFF, W/ CRYSTALS
Lymphocytes-Synovial Fld: 2 % (ref 0–20)
Monocyte-Macrophage-Synovial Fluid: 10 % — ABNORMAL LOW (ref 50–90)
Neutrophil, Synovial: 88 % — ABNORMAL HIGH (ref 0–25)
WBC, Synovial: 43237 /mm3 — ABNORMAL HIGH (ref 0–200)

## 2014-07-12 LAB — MAGNESIUM: Magnesium: 2 mg/dL (ref 1.7–2.4)

## 2014-07-12 LAB — EXPECTORATED SPUTUM ASSESSMENT W GRAM STAIN, RFLX TO RESP C

## 2014-07-12 LAB — EXPECTORATED SPUTUM ASSESSMENT W REFEX TO RESP CULTURE

## 2014-07-12 LAB — URIC ACID: Uric Acid, Serum: 5.4 mg/dL (ref 4.0–7.8)

## 2014-07-12 MED ORDER — SODIUM CHLORIDE 0.9 % IV SOLN
3.0000 g | Freq: Four times a day (QID) | INTRAVENOUS | Status: DC
  Administered 2014-07-13 (×2): 3 g via INTRAVENOUS
  Filled 2014-07-12 (×4): qty 3

## 2014-07-12 MED ORDER — METOPROLOL SUCCINATE ER 50 MG PO TB24
75.0000 mg | ORAL_TABLET | Freq: Every day | ORAL | Status: DC
  Administered 2014-07-13 – 2014-07-14 (×2): 75 mg via ORAL
  Filled 2014-07-12 (×2): qty 1

## 2014-07-12 MED ORDER — SODIUM CHLORIDE 0.9 % IV SOLN
3.0000 g | Freq: Four times a day (QID) | INTRAVENOUS | Status: DC
  Administered 2014-07-12 (×2): 3 g via INTRAVENOUS
  Filled 2014-07-12 (×4): qty 3

## 2014-07-12 NOTE — Progress Notes (Addendum)
ANTIBIOTIC CONSULT NOTE - INITIAL  Pharmacy Consult for Unasyn Indication: pneumonia  Allergies  Allergen Reactions  . Niacin And Related Itching    Patient Measurements: Height: 6' (182.9 cm) Weight: 240 lb 3.2 oz (108.954 kg) (Scale C) IBW/kg (Calculated) : 77.6  Vital Signs: Temp: 99 F (37.2 C) (05/01 0452) Temp Source: Oral (05/01 0452) BP: 114/75 mmHg (05/01 1055) Pulse Rate: 103 (05/01 0452) Intake/Output from previous day: 04/30 0701 - 05/01 0700 In: 2260 [P.O.:1660; IV Piggyback:600] Out: 3750 [Urine:3750] Intake/Output from this shift: Total I/O In: 480 [P.O.:480] Out: 300 [Urine:300]  Labs:  Recent Labs  07/10/14 0542 07/11/14 0549 07/12/14 0611  WBC 24.2* 21.7* 20.3*  HGB 10.4* 9.8* 9.8*  PLT 357 336 416*  CREATININE 1.43* 1.22 1.32*   Estimated Creatinine Clearance: 91.1 mL/min (by C-G formula based on Cr of 1.32). No results for input(s): VANCOTROUGH, VANCOPEAK, VANCORANDOM, GENTTROUGH, GENTPEAK, GENTRANDOM, TOBRATROUGH, TOBRAPEAK, TOBRARND, AMIKACINPEAK, AMIKACINTROU, AMIKACIN in the last 72 hours.   Microbiology: Recent Results (from the past 720 hour(s))  Blood culture (routine x 2)     Status: None (Preliminary result)   Collection Time: 07/07/14 10:01 AM  Result Value Ref Range Status   Specimen Description BLOOD LEFT ANTECUBITAL  Final   Special Requests BOTTLES DRAWN AEROBIC AND ANAEROBIC 5CC  Final   Culture   Final           BLOOD CULTURE RECEIVED NO GROWTH TO DATE CULTURE WILL BE HELD FOR 5 DAYS BEFORE ISSUING A FINAL NEGATIVE REPORT Performed at [facility]    Report Status PENDING  Incomplete  Blood culture (routine x 2)     Status: None (Preliminary result)   Collection Time: 07/07/14 10:13 AM  Result Value Ref Range Status   Specimen Description BLOOD RIGHT ANTECUBITAL  Final   Special Requests BOTTLES DRAWN AEROBIC AND ANAEROBIC 5CC  Final   Culture   Final           BLOOD CULTURE RECEIVED NO GROWTH TO DATE CULTURE  WILL BE HELD FOR 5 DAYS BEFORE ISSUING A FINAL NEGATIVE REPORT Performed at [facility]    Report Status PENDING  Incomplete  Culture, sputum-assessment     Status: None   Collection Time: 07/09/14  1:20 AM  Result Value Ref Range Status   Specimen Description SPUTUM  Final   Special Requests NONE  Final   Sputum evaluation   Final    MICROSCOPIC FINDINGS SUGGEST THAT THIS SPECIMEN IS NOT REPRESENTATIVE OF LOWER RESPIRATORY SECRETIONS. PLEASE RECOLLECT. RESULT CALLED TO, READ BACK BY AND VERIFIED WITH: NOTIFIED L WNOT,RN 07/09/14 0309 BY RHOLMES   Report Status 07/09/2014 FINAL  Final  Respiratory virus panel     Status: None   Collection Time: 07/10/14  1:08 PM  Result Value Ref Range Status   Respiratory Syncytial Virus A Negative Negative Final   Respiratory Syncytial Virus B Negative Negative Final   Influenza A Negative Negative Final   Influenza B Negative Negative Final   Parainfluenza 1 Negative Negative Final   Parainfluenza 2 Negative Negative Final   Parainfluenza 3 Negative Negative Final   Metapneumovirus Negative Negative Final   Rhinovirus Negative Negative Final   Adenovirus Negative Negative Final    Comment: (NOTE) Performed At: [facility] [address] [place], [place] [id number] Lindon Romp MD [id number]     Medical History: Past Medical History  Diagnosis Date  . Hypertension   . MI (myocardial infarction)   . Coronary artery  disease   . Arthritis     lower back  . Sleep apnea     uses cpap    Medications:  Scheduled:  . aspirin EC  81 mg Oral Daily  . docusate sodium  100 mg Oral BID  . enoxaparin (LOVENOX) injection  40 mg Subcutaneous Q24H  . fluticasone  2 spray Each Nare Daily  . guaiFENesin  1,200 mg Oral BID  . lactobacillus  1 g Oral TID WC  . [START ON 07/13/2014] metoprolol succinate  75 mg Oral Daily  . montelukast  10 mg Oral Daily  . sodium chloride  3 mL Intravenous Q12H   Assessment: 44 yo M admitted  on 07/07/2014 with CP and subsequent left middle lobe CAP on CXR. Now s/p 5 days of azithromycin and ceftriaxone but continues to have fevers. Pharmacy consulted to dose Unasyn. SCr trend up to 1.32 (CrCl ~90 ml/min), tmax 101.2, WBC slow trend down to 20.3.   Azithro 4/26>>4/30  CTX 4/26>>5/1  Unasyn 5/1>>   4/29 RVP>>NEG  4/28 sputum>>  4/26 BCx2>>ngtd   Goal of Therapy:  Resolution of infection  Plan:  - Initiate Unasyn 3 gm IV q6h  - Monitor renal function, temp, WBC, C&S   Erika K. Velva Harman, PharmD, [place] Clinical Pharmacist - Resident Pager: [phone] Pharmacy: [phone] 07/12/2014 1:49 PM    ==========================================  Addendum: - pharmacy will sign off as dosage adjustment is likely unnecessary.  Thank you for the consult!    Mariene Dickerman D. Mina Marble, PharmD, BCPS Pager:  [phone] 07/13/2014, 7:24 AM

## 2014-07-12 NOTE — Progress Notes (Signed)
RT Note:Pt has home CPAP at bedside and states he does not need further assistance. Rt will continue to monitor
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
## 2014-07-12 NOTE — Progress Notes (Signed)
Patient ID: George Erickson, male   DOB: [birth year], 44 y.o.   MRN: [id number] George Erickson's right knee aspirate shows crystals consistent with gout.  Would treat with NSAIDs, steroid, gout meds, etc.

## 2014-07-12 NOTE — Progress Notes (Signed)
Patient ID: George Erickson, male   DOB: 02/22/1971, 44 y.o.   MRN: [id number] Given the aspiration of his right knee shows crystals consistent with gout, I came back by and placed an injection of 3 cc plain lidocaine mixed with 1 cc steroid (depomedrol) into his right knee that he tolerated well.

## 2014-07-13 DIAGNOSIS — R079 Chest pain, unspecified: Secondary | ICD-10-CM | POA: Insufficient documentation

## 2014-07-13 DIAGNOSIS — R5081 Fever presenting with conditions classified elsewhere: Secondary | ICD-10-CM

## 2014-07-13 DIAGNOSIS — M10061 Idiopathic gout, right knee: Secondary | ICD-10-CM

## 2014-07-13 DIAGNOSIS — R0781 Pleurodynia: Secondary | ICD-10-CM

## 2014-07-13 DIAGNOSIS — R05 Cough: Secondary | ICD-10-CM

## 2014-07-13 DIAGNOSIS — M109 Gout, unspecified: Secondary | ICD-10-CM

## 2014-07-13 DIAGNOSIS — R059 Cough, unspecified: Secondary | ICD-10-CM | POA: Insufficient documentation

## 2014-07-13 DIAGNOSIS — M1000 Idiopathic gout, unspecified site: Secondary | ICD-10-CM | POA: Insufficient documentation

## 2014-07-13 LAB — COMPREHENSIVE METABOLIC PANEL
ALT: 22 U/L (ref 17–63)
AST: 19 U/L (ref 15–41)
Albumin: 2.9 g/dL — ABNORMAL LOW (ref 3.5–5.0)
Alkaline Phosphatase: 46 U/L (ref 38–126)
Anion gap: 12 (ref 5–15)
BUN: 10 mg/dL (ref 6–20)
CO2: 26 mmol/L (ref 22–32)
Calcium: 8.4 mg/dL — ABNORMAL LOW (ref 8.9–10.3)
Chloride: 102 mmol/L (ref 101–111)
Creatinine, Ser: 1.19 mg/dL (ref 0.61–1.24)
GFR calc Af Amer: >60 mL/min (ref >60)
GFR calc non Af Amer: >60 mL/min (ref >60)
Glucose, Bld: 108 mg/dL — ABNORMAL HIGH (ref 70–99)
Potassium: 3.8 mmol/L (ref 3.5–5.1)
Sodium: 140 mmol/L (ref 135–145)
Total Bilirubin: 1 mg/dL (ref 0.3–1.2)
Total Protein: 6.3 g/dL — ABNORMAL LOW (ref 6.5–8.1)

## 2014-07-13 LAB — CULTURE, BLOOD (ROUTINE X 2)
Culture: NO GROWTH
Culture: NO GROWTH

## 2014-07-13 LAB — CBC
HCT: 26.2 % — ABNORMAL LOW (ref 39.0–52.0)
Hemoglobin: 9.6 g/dL — ABNORMAL LOW (ref 13.0–17.0)
MCH: 28.6 pg (ref 26.0–34.0)
MCHC: 36.6 g/dL — ABNORMAL HIGH (ref 30.0–36.0)
MCV: 78 fL (ref 78.0–100.0)
Platelets: 407 10*3/uL — ABNORMAL HIGH (ref 150–400)
RBC: 3.36 MIL/uL — ABNORMAL LOW (ref 4.22–5.81)
RDW: 17.4 % — ABNORMAL HIGH (ref 11.5–15.5)
WBC: 16.1 10*3/uL — ABNORMAL HIGH (ref 4.0–10.5)

## 2014-07-13 MED ORDER — IBUPROFEN 400 MG PO TABS
400.0000 mg | ORAL_TABLET | Freq: Three times a day (TID) | ORAL | Status: DC
  Administered 2014-07-13 – 2014-07-14 (×4): 400 mg via ORAL
  Filled 2014-07-13 (×5): qty 1

## 2014-07-13 MED ORDER — ACETAMINOPHEN 325 MG PO TABS: 650.0000 mg | ORAL_TABLET | Freq: Four times a day (QID) | ORAL | Status: DC | PRN

## 2014-07-13 MED ORDER — HYDROCODONE-ACETAMINOPHEN 5-325 MG PO TABS
1.0000 | ORAL_TABLET | ORAL | Status: DC | PRN
  Administered 2014-07-13: 2 via ORAL
  Filled 2014-07-13: qty 2

## 2014-07-13 MED ORDER — ACETAMINOPHEN 650 MG RE SUPP: 650.0000 mg | Freq: Four times a day (QID) | RECTAL | Status: DC | PRN

## 2014-07-13 MED ORDER — AMOXICILLIN-POT CLAVULANATE 875-125 MG PO TABS
1.0000 | ORAL_TABLET | Freq: Two times a day (BID) | ORAL | Status: DC
  Administered 2014-07-13: 1 via ORAL
  Filled 2014-07-13 (×4): qty 1
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
## 2014-07-13 NOTE — Evaluation (Signed)
Clinical/Bedside Swallow Evaluation Patient Details  Name: George Erickson MRN: [id number] Date of Birth: [birth year]  Today's Date: 07/13/2014 Time: SLP Start Time (ACUTE ONLY): [id number] SLP Stop Time (ACUTE ONLY): 1445 SLP Time Calculation (min) (ACUTE ONLY): 30 min  Past Medical History:  Past Medical History  Diagnosis Date  . Hypertension   . MI (myocardial infarction)   . Coronary artery disease   . Arthritis     lower back  . Sleep apnea     uses cpap   Past Surgical History:  Past Surgical History  Procedure Laterality Date  . Coronary stent placement  2011  . Nasal sinus surgery    . Hernia repair      hernia repair at birth   HPI:      Assessment / Plan / Recommendation Clinical Impression  Oral motor strength and function appear adequate. No overt s/s aspiration observed with any consistency tested. Pt reports difficulty primarily with meats, and describes globus sensation. Time and liquid wash are beneficial to relieve sensation. Based on reported symptoms and clinical presentation/symptoms, recommend completing REGULAR barium swallow to evaluate esophageal motility. ST to follow up after Barium Swallow results and recommendations are available, to determine if further (oropharyngeal) workup is warranted. Pt has few if any high risk indicators to raise suspicion of oropharyngeal dysphagia. Pt was provided with written dietary and behavioral suggestions to manage esophageal dysmotility.    Aspiration Risk  Mild    Diet Recommendation Age appropriate regular solids;Thin   Medication Administration: Whole meds with liquid Compensations: Slow rate;Small sips/bites;Follow solids with liquid    Other  Recommendations Recommended Consults: Consider esophageal assessment Oral Care Recommendations: Oral care BID   Follow Up Recommendations       Frequency and Duration    1 week   Pertinent Vitals/Pain VSS, pain 3-4/10 chest pain, especially with coughing    SLP  Swallow Goals  pending Barium Swallow results   Swallow Study Prior Functional Status  Type of Home: House Available Help at Discharge: Family;Available PRN/intermittently    General Date of Onset: 07/07/14 Type of Study: Bedside swallow evaluation Previous Swallow Assessment: none Diet Prior to this Study: Regular;Thin liquids Temperature Spikes Noted: No Respiratory Status: Room air History of Recent Intubation: No Behavior/Cognition: Alert;Cooperative;Pleasant mood Oral Cavity - Dentition: Adequate natural dentition/normal for age Self-Feeding Abilities: Able to feed self Patient Positioning: Upright in chair/Tumbleform Baseline Vocal Quality: Normal Volitional Cough: Strong Volitional Swallow: Able to elicit    Oral/Motor/Sensory Function Overall Oral Motor/Sensory Function: Appears within functional limits for tasks assessed   Ice Chips Ice chips: Not tested   Thin Liquid Thin Liquid: Within functional limits Presentation: Straw    Nectar Thick Nectar Thick Liquid: Not tested   Honey Thick Honey Thick Liquid: Not tested   Puree Puree: Within functional limits Presentation: Self Fed;Spoon   Solid   GO   Visente Kirker B. Kamauri Kathol, [facility], CCC-SLP [id number] Solid: Within functional limits Presentation: [place], Marquest Gunkel Brown 07/13/2014,3:06 PM
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
## 2014-07-13 NOTE — Evaluation (Signed)
Physical Therapy Evaluation/ Discharge Patient Details Name: George Erickson MRN: [id number] DOB: 11/09/1970 Today's Date: 07/13/2014   History of Present Illness  44yo male with hx OSA on CPAP, CAD admitted 4/26 by [facility] with CAP and treated with Rocephin/Azithro. Had ongoing dyspnea, high fevers and Right knee pain with gout s/p arthrocentesis 5/1  Clinical Impression  Pt moving well and reports no SOB or deficits with mobility. Pt reports ongoing knee pain for 2 years and didn't know it was gout. Pt educated for use of cane should he choose to use one to decrease weight and pain which he currently denies. Pt at baseline functional level with sats 98% on RA with gait and no further therapy needs at this time, pt aware and agreeable. Will sign off.     Follow Up Recommendations No PT follow up    Equipment Recommendations  None recommended by PT    Recommendations for Other Services       Precautions / Restrictions Precautions Precautions: None      Mobility  Bed Mobility               General bed mobility comments: in chair before and after session  Transfers Overall transfer level: Modified independent                  Ambulation/Gait Ambulation/Gait assistance: Independent Ambulation Distance (Feet): 400 Feet Assistive device: None Gait Pattern/deviations: WFL(Within Functional Limits);Antalgic   Gait velocity interpretation: at or above normal speed for age/gender General Gait Details: pt with slightly antalgic gait but denied need or trial of cane or Rw, no LOB or significantly decreased speed with gait  Stairs Stairs: Yes Stairs assistance: Modified independent (Device/Increase time) Stair Management: Step to pattern;Forwards Number of Stairs: 4    Wheelchair Mobility    Modified Rankin (Stroke Patients Only)       Balance Overall balance assessment: No apparent balance deficits (not formally assessed)                                            Pertinent Vitals/Pain Pain Assessment: 0-10 Pain Score: 2  Pain Location: right knee Pain Descriptors / Indicators: Aching Pain Intervention(s): Repositioned    Home Living Family/patient expects to be discharged to:: Private residence Living Arrangements: Spouse/significant other Available Help at Discharge: Family;Available PRN/intermittently Type of Home: House Home Access: Stairs to enter Entrance Stairs-Rails: Psychiatric nurse of Steps: 5 Home Layout: One level Home Equipment: None      Prior Function Level of Independence: Independent               Hand Dominance        Extremity/Trunk Assessment   Upper Extremity Assessment: Overall WFL for tasks assessed           Lower Extremity Assessment: RLE deficits/detail RLE Deficits / Details: pt reports pain in Right knee with decreased ROM but WFL    Cervical / Trunk Assessment: Normal  Communication   Communication: No difficulties  Cognition Arousal/Alertness: Awake/alert Behavior During Therapy: WFL for tasks assessed/performed Overall Cognitive Status: Within Functional Limits for tasks assessed                      General Comments      Exercises        Assessment/Plan    PT Assessment Patent does not  need any further PT services  PT Diagnosis Difficulty walking;Acute pain   PT Problem List    PT Treatment Interventions     PT Goals (Current goals can be found in the Care Plan section) Acute Rehab PT Goals PT Goal Formulation: All assessment and education complete, DC therapy    Frequency     Barriers to discharge        Co-evaluation               End of Session   Activity Tolerance: Patient tolerated treatment well Patient left: in chair;with call bell/phone within reach Nurse Communication: Mobility status         Time: 1210-1220 PT Time Calculation (min) (ACUTE ONLY): 10 min   Charges:   PT  Evaluation $Initial PT Evaluation Tier I: 1 Procedure     PT G CodesMelford Aase 07/13/2014, 1:18 PM Elwyn Reach, [place]
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
## 2014-07-13 NOTE — Progress Notes (Signed)
Introduced self to pt.  Call bell at reach.  Instructed to call for assistance.  Verbalized understanding.

## 2014-07-13 NOTE — Progress Notes (Signed)
Pt can place on CPAP machine when ready. Pt encouraged to call RT if pt needs assistance.

## 2014-07-14 ENCOUNTER — Inpatient Hospital Stay (HOSPITAL_COMMUNITY): Payer: BLUE CROSS/BLUE SHIELD

## 2014-07-14 ENCOUNTER — Telehealth: Payer: Self-pay | Admitting: *Deleted

## 2014-07-14 DIAGNOSIS — N179 Acute kidney failure, unspecified: Secondary | ICD-10-CM

## 2014-07-14 LAB — RHEUMATOID FACTOR: Rhuematoid fact SerPl-aCnc: 18 IU/mL — ABNORMAL HIGH (ref <=14)

## 2014-07-14 LAB — CBC
HCT: 25.2 % — ABNORMAL LOW (ref 39.0–52.0)
Hemoglobin: 9.1 g/dL — ABNORMAL LOW (ref 13.0–17.0)
MCH: 28.7 pg (ref 26.0–34.0)
MCHC: 36.1 g/dL — ABNORMAL HIGH (ref 30.0–36.0)
MCV: 79.5 fL (ref 78.0–100.0)
Platelets: 410 10*3/uL — ABNORMAL HIGH (ref 150–400)
RBC: 3.17 MIL/uL — ABNORMAL LOW (ref 4.22–5.81)
RDW: 17.7 % — ABNORMAL HIGH (ref 11.5–15.5)
WBC: 15 10*3/uL — ABNORMAL HIGH (ref 4.0–10.5)

## 2014-07-14 LAB — CREATININE, SERUM
Creatinine, Ser: 1.11 mg/dL (ref 0.61–1.24)
GFR calc Af Amer: >60 mL/min (ref >60)
GFR calc non Af Amer: >60 mL/min (ref >60)

## 2014-07-14 MED ORDER — DEXTROMETHORPHAN POLISTIREX ER 30 MG/5ML PO SUER: 30.0000 mg | Freq: Two times a day (BID) | ORAL | Status: DC | PRN

## 2014-07-14 MED ORDER — AMOXICILLIN-POT CLAVULANATE 875-125 MG PO TABS
1.0000 | ORAL_TABLET | Freq: Two times a day (BID) | ORAL | Status: DC
Start: 2014-07-14 — End: 2019-12-01

## 2014-07-14 MED ORDER — IBUPROFEN 200 MG PO TABS: 400.0000 mg | ORAL_TABLET | Freq: Four times a day (QID) | ORAL | Status: DC | PRN

## 2014-07-14 MED ORDER — AMOXICILLIN-POT CLAVULANATE 875-125 MG PO TABS
1.0000 | ORAL_TABLET | Freq: Two times a day (BID) | ORAL | Status: DC
  Administered 2014-07-14 (×2): 1 via ORAL
  Filled 2014-07-14 (×3): qty 1

## 2014-07-14 MED ORDER — METOPROLOL SUCCINATE ER 25 MG PO TB24: 75.0000 mg | ORAL_TABLET | Freq: Every day | ORAL | Status: DC

## 2014-07-14 MED ORDER — GUAIFENESIN ER 600 MG PO TB12: 1200.0000 mg | ORAL_TABLET | Freq: Two times a day (BID) | ORAL | Status: DC

## 2014-07-14 NOTE — Progress Notes (Signed)
Off going secretary informed to schedule appt.  Instructed will let incoming secretary know.  Karie Kirks, Therapist, sports.

## 2014-07-14 NOTE — ED Notes (Signed)
Contacted by [place] for confirmation of discharge MD name, Vernell Leep.
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
## 2014-07-14 NOTE — Discharge Instructions (Signed)
Pneumonia Pneumonia is an infection of the lungs.  CAUSES Pneumonia may be caused by bacteria or a virus. Usually, these infections are caused by breathing infectious particles into the lungs (respiratory tract). SIGNS AND SYMPTOMS   Cough.  Fever.  Chest pain.  Increased rate of breathing.  Wheezing.  Mucus production. DIAGNOSIS  If you have the common symptoms of pneumonia, your health care provider will typically confirm the diagnosis with a chest X-ray. The X-ray will show an abnormality in the lung (pulmonary infiltrate) if you have pneumonia. Other tests of your blood, urine, or sputum may be done to find the specific cause of your pneumonia. Your health care provider may also do tests (blood gases or pulse oximetry) to see how well your lungs are working. TREATMENT  Some forms of pneumonia may be spread to other people when you cough or sneeze. You may be asked to wear a mask before and during your exam. Pneumonia that is caused by bacteria is treated with antibiotic medicine. Pneumonia that is caused by the influenza virus may be treated with an antiviral medicine. Most other viral infections must run their course. These infections will not respond to antibiotics.  HOME CARE INSTRUCTIONS   Cough suppressants may be used if you are losing too much rest. However, coughing protects you by clearing your lungs. You should avoid using cough suppressants if you can.  Your health care provider may have prescribed medicine if he or she thinks your pneumonia is caused by bacteria or influenza. Finish your medicine even if you start to feel better.  Your health care provider may also prescribe an expectorant. This loosens the mucus to be coughed up.  Take medicines only as directed by your health care provider.  Do not smoke. Smoking is a common cause of bronchitis and can contribute to pneumonia. If you are a smoker and continue to smoke, your cough may last several weeks after your  pneumonia has cleared.  A cold steam vaporizer or humidifier in your room or home may help loosen mucus.  Coughing is often worse at night. Sleeping in a semi-upright position in a recliner or using a couple pillows under your head will help with this.  Get rest as you feel it is needed. Your body will usually let you know when you need to rest. PREVENTION A pneumococcal shot (vaccine) is available to prevent a common bacterial cause of pneumonia. This is usually suggested for:  People over 65 years old.  Patients on chemotherapy.  People with chronic lung problems, such as bronchitis or emphysema.  People with immune system problems. If you are over 65 or have a high risk condition, you may receive the pneumococcal vaccine if you have not received it before. In some countries, a routine influenza vaccine is also recommended. This vaccine can help prevent some cases of pneumonia.You may be offered the influenza vaccine as part of your care. If you smoke, it is time to quit. You may receive instructions on how to stop smoking. Your health care provider can provide medicines and counseling to help you quit. SEEK MEDICAL CARE IF: You have a fever. SEEK IMMEDIATE MEDICAL CARE IF:   Your illness becomes worse. This is especially true if you are elderly or weakened from any other disease.  You cannot control your cough with suppressants and are losing sleep.  You begin coughing up blood.  You develop pain which is getting worse or is uncontrolled with medicines.  Any of the symptoms   which initially brought you in for treatment are getting worse rather than better.  You develop shortness of breath or chest pain. MAKE SURE YOU:   Understand these instructions.  Will watch your condition.  Will get help right away if you are not doing well or get worse. Document Released: 02/27/2005 Document Revised: 07/14/2013 Document Reviewed: 05/19/2010 [facility] Patient Information 2015  [place], [place]. This information is not intended to replace advice given to you by your health care provider. Make sure you discuss any questions you have with your health care provider.  Gout Gout is an inflammatory arthritis caused by a buildup of uric acid crystals in the joints. Uric acid is a chemical that is normally present in the blood. When the level of uric acid in the blood is too high it can form crystals that deposit in your joints and tissues. This causes joint redness, soreness, and swelling (inflammation). Repeat attacks are common. Over time, uric acid crystals can form into masses (tophi) near a joint, destroying bone and causing disfigurement. Gout is treatable and often preventable. CAUSES  The disease begins with elevated levels of uric acid in the blood. Uric acid is produced by your body when it breaks down a naturally found substance called purines. Certain foods you eat, such as meats and fish, contain high amounts of purines. Causes of an elevated uric acid level include:  Being passed down from parent to child (heredity).  Diseases that cause increased uric acid production (such as obesity, psoriasis, and certain cancers).  Excessive alcohol use.  Diet, especially diets rich in meat and seafood.  Medicines, including certain cancer-fighting medicines (chemotherapy), water pills (diuretics), and aspirin.  Chronic kidney disease. The kidneys are no longer able to remove uric acid well.  Problems with metabolism. Conditions strongly associated with gout include:  Obesity.  High blood pressure.  High cholesterol.  Diabetes. Not everyone with elevated uric acid levels gets gout. It is not understood why some people get gout and others do not. Surgery, joint injury, and eating too much of certain foods are some of the factors that can lead to gout attacks. SYMPTOMS   An attack of gout comes on quickly. It causes intense pain with redness, swelling, and warmth in a  joint.  Fever can occur.  Often, only one joint is involved. Certain joints are more commonly involved:  Base of the big toe.  Knee.  Ankle.  Wrist.  Finger. Without treatment, an attack usually goes away in a few days to weeks. Between attacks, you usually will not have symptoms, which is different from many other forms of arthritis. DIAGNOSIS  Your caregiver will suspect gout based on your symptoms and exam. In some cases, tests may be recommended. The tests may include:  Blood tests.  Urine tests.  X-rays.  Joint fluid exam. This exam requires a needle to remove fluid from the joint (arthrocentesis). Using a microscope, gout is confirmed when uric acid crystals are seen in the joint fluid. TREATMENT  There are two phases to gout treatment: treating the sudden onset (acute) attack and preventing attacks (prophylaxis).  Treatment of an Acute Attack.  Medicines are used. These include anti-inflammatory medicines or steroid medicines.  An injection of steroid medicine into the affected joint is sometimes necessary.  The painful joint is rested. Movement can worsen the arthritis.  You may use warm or cold treatments on painful joints, depending which works best for you.  Treatment to Prevent Attacks.  If you suffer from  frequent gout attacks, your caregiver may advise preventive medicine. These medicines are started after the acute attack subsides. These medicines either help your kidneys eliminate uric acid from your body or decrease your uric acid production. You may need to stay on these medicines for a very long time.  The early phase of treatment with preventive medicine can be associated with an increase in acute gout attacks. For this reason, during the first few months of treatment, your caregiver may also advise you to take medicines usually used for acute gout treatment. Be sure you understand your caregiver's directions. Your caregiver may make several adjustments  to your medicine dose before these medicines are effective.  Discuss dietary treatment with your caregiver or dietitian. Alcohol and drinks high in sugar and fructose and foods such as meat, poultry, and seafood can increase uric acid levels. Your caregiver or dietitian can advise you on drinks and foods that should be limited. HOME CARE INSTRUCTIONS   Do not take aspirin to relieve pain. This raises uric acid levels.  Only take over-the-counter or prescription medicines for pain, discomfort, or fever as directed by your caregiver.  Rest the joint as much as possible. When in bed, keep sheets and blankets off painful areas.  Keep the affected joint raised (elevated).  Apply warm or cold treatments to painful joints. Use of warm or cold treatments depends on which works best for you.  Use crutches if the painful joint is in your leg.  Drink enough fluids to keep your urine clear or pale yellow. This helps your body get rid of uric acid. Limit alcohol, sugary drinks, and fructose drinks.  Follow your dietary instructions. Pay careful attention to the amount of protein you eat. Your daily diet should emphasize fruits, vegetables, whole grains, and fat-free or low-fat milk products. Discuss the use of coffee, vitamin C, and cherries with your caregiver or dietitian. These may be helpful in lowering uric acid levels.  Maintain a healthy body weight. SEEK MEDICAL CARE IF:   You develop diarrhea, vomiting, or any side effects from medicines.  You do not feel better in 24 hours, or you are getting worse. SEEK IMMEDIATE MEDICAL CARE IF:   Your joint becomes suddenly more tender, and you have chills or a fever. MAKE SURE YOU:   Understand these instructions.  Will watch your condition.  Will get help right away if you are not doing well or get worse. Document Released: 02/25/2000 Document Revised: 07/14/2013 Document Reviewed: 10/11/2011 [facility] Patient Information 2015 [place], [place].  This information is not intended to replace advice given to you by your health care provider. Make sure you discuss any questions you have with your health care provider.

## 2014-07-16 LAB — BODY FLUID CULTURE: Culture: NO GROWTH

## 2014-07-23 ENCOUNTER — Ambulatory Visit
Admission: RE | Admit: 2014-07-23 | Discharge: 2014-07-23 | Disposition: A | Discharge status: Home / Self Care | Payer: BLUE CROSS/BLUE SHIELD | Source: Ambulatory Visit | Attending: Family Medicine | Admitting: Family Medicine

## 2014-07-23 DIAGNOSIS — M5430 Sciatica, unspecified side: Secondary | ICD-10-CM

## 2014-08-28 ENCOUNTER — Ambulatory Visit
Admission: RE | Admit: 2014-08-28 | Discharge: 2014-08-28 | Disposition: A | Discharge status: Home / Self Care | Payer: BLUE CROSS/BLUE SHIELD | Source: Ambulatory Visit | Attending: Family Medicine | Admitting: Family Medicine

## 2014-08-28 ENCOUNTER — Other Ambulatory Visit: Payer: Self-pay | Admitting: Family Medicine

## 2014-08-28 DIAGNOSIS — Z09 Encounter for follow-up examination after completed treatment for conditions other than malignant neoplasm: Secondary | ICD-10-CM

## 2014-09-05 ENCOUNTER — Emergency Department (HOSPITAL_COMMUNITY)
Admission: EM | Admit: 2014-09-05 | Discharge: 2014-09-05 | Disposition: A | Discharge status: Home / Self Care | Payer: BLUE CROSS/BLUE SHIELD | Attending: Emergency Medicine | Admitting: Emergency Medicine

## 2014-09-05 ENCOUNTER — Encounter (HOSPITAL_COMMUNITY): Payer: Self-pay

## 2014-09-05 DIAGNOSIS — I10 Essential (primary) hypertension: Secondary | ICD-10-CM | POA: Diagnosis not present

## 2014-09-05 DIAGNOSIS — G4730 Sleep apnea, unspecified: Secondary | ICD-10-CM | POA: Diagnosis not present

## 2014-09-05 DIAGNOSIS — M1990 Unspecified osteoarthritis, unspecified site: Secondary | ICD-10-CM | POA: Insufficient documentation

## 2014-09-05 DIAGNOSIS — M545 Low back pain: Secondary | ICD-10-CM | POA: Diagnosis present

## 2014-09-05 DIAGNOSIS — Z7982 Long term (current) use of aspirin: Secondary | ICD-10-CM | POA: Insufficient documentation

## 2014-09-05 DIAGNOSIS — Z79899 Other long term (current) drug therapy: Secondary | ICD-10-CM | POA: Diagnosis not present

## 2014-09-05 DIAGNOSIS — I2510 Atherosclerotic heart disease of native coronary artery without angina pectoris: Secondary | ICD-10-CM | POA: Diagnosis not present

## 2014-09-05 DIAGNOSIS — I252 Old myocardial infarction: Secondary | ICD-10-CM | POA: Diagnosis not present

## 2014-09-05 DIAGNOSIS — Z7951 Long term (current) use of inhaled steroids: Secondary | ICD-10-CM | POA: Insufficient documentation

## 2014-09-05 DIAGNOSIS — Z9981 Dependence on supplemental oxygen: Secondary | ICD-10-CM | POA: Insufficient documentation

## 2014-09-05 DIAGNOSIS — M62830 Muscle spasm of back: Secondary | ICD-10-CM | POA: Diagnosis not present

## 2014-09-05 MED ORDER — KETOROLAC TROMETHAMINE 60 MG/2ML IM SOLN
30.0000 mg | Freq: Once | INTRAMUSCULAR | Status: AC
Start: 2014-09-05 — End: 2014-09-05
  Administered 2014-09-05: 30 mg via INTRAMUSCULAR
  Filled 2014-09-05: qty 2

## 2014-09-05 MED ORDER — DIAZEPAM 5 MG PO TABS: 5.0000 mg | ORAL_TABLET | Freq: Two times a day (BID) | ORAL | Status: DC

## 2014-09-05 MED ORDER — DIAZEPAM 5 MG PO TABS
5.0000 mg | ORAL_TABLET | Freq: Once | ORAL | Status: AC
  Administered 2014-09-05: 5 mg via ORAL
  Filled 2014-09-05: qty 1

## 2014-09-05 MED ORDER — OXYCODONE-ACETAMINOPHEN 5-325 MG PO TABS
1.0000 | ORAL_TABLET | Freq: Once | ORAL | Status: AC
Start: 2014-09-05 — End: 2014-09-05
  Administered 2014-09-05: 1 via ORAL
  Filled 2014-09-05: qty 1

## 2014-09-05 NOTE — ED Provider Notes (Signed)
CSN: [id number]     Arrival date & time 09/05/14  0617 History   First MD Initiated Contact with Patient 09/05/14 [phone]     Chief Complaint  Patient presents with  . Spasms   George Erickson is a 44 y.o. male with a history of back pain and back spasms who presents to the ED complaining of bilateral lower back spasms worse since yesterday. 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 He sees an orthopedic surgeon who called in meloxicam and flexeril, but he has continued to have back pain. He currently complains of 6 out of 10 bilateral low back pain. He reports his pain is worse with movement. Patient last took Flexeril at 4 AM this morning. He denies any falls or injuries to his back. He reports he is due to have a steroid injection by Dr. Patrick Jupiter on 09/24/2014. The patient denies fevers, chills, difficult to urinating, loss of bladder control, loss of bowel control, numbness, tingling, weakness, history of cancer, history of IV drug use, weight changes, urinary symptoms, abdominal pain, nausea, vomiting or rashes.  (Consider location/radiation/quality/duration/timing/severity/associated sxs/prior Treatment) HPI  Past Medical History  Diagnosis Date  . Hypertension   . MI (myocardial infarction)   . Coronary artery disease   . Arthritis     lower back  . Sleep apnea     uses cpap   Past Surgical History  Procedure Laterality Date  . Coronary stent placement  2011  . Nasal sinus surgery    . 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 History  Substance Use Topics  . Smoking status: Never Smoker   . Smokeless tobacco: Never Used  . Alcohol Use: No    Review of Systems  Constitutional: Negative for fever and chills.  HENT: Negative for congestion and sore throat.   Eyes: Negative for visual disturbance.  Respiratory: Negative for cough and shortness of breath.    Cardiovascular: Negative for chest pain.  Gastrointestinal: Negative for nausea, vomiting, abdominal pain and diarrhea.  Genitourinary: Negative for dysuria, urgency, frequency, hematuria and difficulty urinating.  Musculoskeletal: Positive for back pain. Negative for gait problem, neck pain and neck stiffness.  Skin: Negative for rash.  Neurological: Negative for syncope, weakness, light-headedness, numbness and headaches.      Allergies  Niacin and related  Home Medications   Prior to Admission medications   Medication Sig Start Date End Date Taking? Authorizing Provider  amoxicillin-clavulanate (AUGMENTIN) 875-125 MG per tablet Take 1 tablet by mouth 2 (two) times daily with a meal. 07/14/14   Modena Jansky, MD  aspirin EC 81 MG tablet Take 81 mg by mouth daily.    Historical Provider, MD  dextromethorphan (DELSYM) 30 MG/5ML liquid Take 5 mLs (30 mg total) by mouth 2 (two) times daily as needed for cough. 07/14/14   Modena Jansky, MD  diazepam (VALIUM) 5 MG tablet Take 1 tablet (5 mg total) by mouth 2 (two) times daily. 09/05/14   Waynetta Pean, PA-C  fluticasone (FLONASE) 50 MCG/ACT nasal spray Place 2 sprays into the nose daily.    Historical Provider, MD  guaiFENesin (MUCINEX) 600 MG 12 hr tablet Take 2 tablets (1,200 mg total) by mouth 2 (two) times daily. 07/14/14   Modena Jansky, MD  ibuprofen (ADVIL,MOTRIN) 200 MG tablet Take 2-3 tablets (400-600 mg total) by mouth  every 6 (six) hours as needed for fever, headache, mild pain or moderate pain. Take for 3-5 days. 07/14/14   Modena Jansky, MD  metoprolol succinate (TOPROL-XL) 25 MG 24 hr tablet Take 3 tablets (75 mg total) by mouth daily. 07/14/14   Modena Jansky, MD  montelukast (SINGULAIR) 10 MG tablet Take 10 mg by mouth daily.    Historical Provider, MD   BP 141/93 mmHg  Pulse 71  Temp(Src) 98.6 F (37 C) (Oral)  Resp 24  Ht 6' (1.829 m)  Wt 240 lb (108.863 kg)  BMI 32.54 kg/m2  SpO2 94% Physical Exam   Constitutional: He is oriented to person, place, and time. He appears well-developed and well-nourished. No distress.  Nontoxic appearing.  HENT:  Head: Normocephalic and atraumatic.  Mouth/Throat: Oropharynx is clear and moist. No oropharyngeal exudate.  Eyes: Conjunctivae are normal. Pupils are equal, round, and reactive to light. Right eye exhibits no discharge. Left eye exhibits no discharge.  Neck: Neck supple.  Cardiovascular: Normal rate, regular rhythm, normal heart sounds and intact distal pulses.   Bilateral radial, posterior tibialis and dorsalis pedis pulses are intact.    Pulmonary/Chest: Effort normal and breath sounds normal. No respiratory distress. He has no wheezes. He has no rales.  Abdominal: Soft. He exhibits no distension. There is no tenderness. There is no guarding.  Musculoskeletal: He exhibits tenderness. He exhibits no edema.  No midline back tenderness. Bilateral Paraspinous muscles feel in spasm and are slightly tender to palpation. No back edema, erythema or deformity. Patient is spontaneously moving all extremities in a coordinated fashion exhibiting good strength. Strength is 5/5 in his bilateral upper and lower extremities. Patient is able to ambulate in the room without assistance. Antalgic gait.    Lymphadenopathy:    He has no cervical adenopathy.  Neurological: He is alert and oriented to person, place, and time. He has normal reflexes. He displays normal reflexes. Coordination normal.  Bilateral patellar DTRs are intact. Sensation is intact in his bilateral lower extremities.  Skin: Skin is warm and dry. No rash noted. He is not diaphoretic. No erythema. No pallor.  Psychiatric: He has a normal mood and affect. His behavior is normal.  Nursing note and vitals reviewed.   ED Course  Procedures (including critical care time) Labs Review Labs Reviewed - No data to display  Imaging Review No results found.   EKG Interpretation None      Filed  Vitals:   09/05/14 0628  BP: 141/93  Pulse: 71  Temp: 98.6 F (37 C)  TempSrc: Oral  Resp: 24  Height: 6' (1.829 m)  Weight: 240 lb (108.863 kg)  SpO2: 94%     MDM   Meds given in ED:  Medications  ketorolac (TORADOL) injection 30 mg (30 mg Intramuscular Given 09/05/14 0710)  diazepam (VALIUM) tablet 5 mg (5 mg Oral Given 09/05/14 0709)  oxyCODONE-acetaminophen (PERCOCET/ROXICET) 5-325 MG per tablet 1 tablet (1 tablet Oral Given 09/05/14 0709)    New Prescriptions   DIAZEPAM (VALIUM) 5 MG TABLET    Take 1 tablet (5 mg total) by mouth 2 (two) times daily.    Final diagnoses:  Back muscle spasm   Patient with bilateral low back spasm. No relief with meloxicam and flexeril.   No neurological deficits and normal neuro exam.  Patient can walk but states is painful.  No loss of bowel or bladder control.  No concern for cauda equina.  No fever, night sweats, weight loss, h/o cancer,  IVDU.  RICE protocol and pain medicine indicated and discussed with patient.  Patient's creatine is 1.11. Will give toradol, valium and a percocet prior to discharge. Patient discharged with valium. Advised to follow up with his orthopedic surgeon this week. I advised the patient to follow-up with their primary care provider this week. I advised the patient to return to the emergency department with new or worsening symptoms or new concerns. The patient verbalized understanding and agreement with plan.      Waynetta Pean, PA-C 09/05/14 [id number]  Jola Schmidt, MD 09/05/14 [phone]

## 2014-09-05 NOTE — Discharge Instructions (Signed)
Back Exercises °Back exercises help treat and prevent back injuries. The goal of back exercises is to increase the strength of your abdominal and back muscles and the flexibility of your back. These exercises should be started when you no longer have back pain. Back exercises include: °· Pelvic Tilt. Lie on your back with your knees bent. Tilt your pelvis until the lower part of your back is against the floor. Hold this position 5 to 10 sec and repeat 5 to 10 times. °· Knee to Chest. Pull first 1 knee up against your chest and hold for 20 to 30 seconds, repeat this with the other knee, and then both knees. This may be done with the other leg straight or bent, whichever feels better. °· Sit-Ups or Curl-Ups. Bend your knees 90 degrees. Start with tilting your pelvis, and do a partial, slow sit-up, lifting your trunk only 30 to 45 degrees off the floor. Take at least 2 to 3 seconds for each sit-up. Do not do sit-ups with your knees out straight. If partial sit-ups are difficult, simply do the above but with only tightening your abdominal muscles and holding it as directed. °· Hip-Lift. Lie on your back with your knees flexed 90 degrees. Push down with your feet and shoulders as you raise your hips a couple inches off the floor; hold for 10 seconds, repeat 5 to 10 times. °· Back arches. Lie on your stomach, propping yourself up on bent elbows. Slowly press on your hands, causing an arch in your low back. Repeat 3 to 5 times. Any initial stiffness and discomfort should lessen with repetition over time. °· Shoulder-Lifts. Lie face down with arms beside your body. Keep hips and torso pressed to floor as you slowly lift your head and shoulders off the floor. °Do not overdo your exercises, especially in the beginning. Exercises may cause you some mild back discomfort which lasts for a few minutes; however, if the pain is more severe, or lasts for more than 15 minutes, do not continue exercises until you see your caregiver.  Improvement with exercise therapy for back problems is slow.  °See your caregivers for assistance with developing a proper back exercise program. °Document Released: 04/06/2004 Document Revised: 05/22/2011 Document Reviewed: 12/29/2010 °[employer]® Patient Information ©2015 [employer]. This information is not intended to replace advice given to you by your health care provider. Make sure you discuss any questions you have with your health care provider. °Muscle Cramps and Spasms °Muscle cramps and spasms occur when a muscle or muscles tighten and you have no control over this tightening (involuntary muscle contraction). They are a common problem and can develop in any muscle. The most common place is in the calf muscles of the leg. Both muscle cramps and muscle spasms are involuntary muscle contractions, but they also have differences:  °· Muscle cramps are sporadic and painful. They may last a few seconds to a quarter of an hour. Muscle cramps are often more forceful and last longer than muscle spasms. °· Muscle spasms may or may not be painful. They may also last just a few seconds or much longer. °CAUSES  °It is uncommon for cramps or spasms to be due to a serious underlying problem. In many cases, the cause of cramps or spasms is unknown. Some common causes are:  °· Overexertion.   °· Overuse from repetitive motions (doing the same thing over and over).   °· Remaining in a certain position for a long period of time.   °· Improper   preparation, form, or technique while performing a sport or activity.   °· Dehydration.   °· Injury.   °· Side effects of some medicines.   °· Abnormally low levels of the salts and ions in your blood (electrolytes), especially potassium and calcium. This could happen if you are taking water pills (diuretics) or you are pregnant.   °Some underlying medical problems can make it more likely to develop cramps or spasms. These include, but are not limited to:  °· Diabetes.   °· Parkinson  disease.   °· Hormone disorders, such as thyroid problems.   °· Alcohol abuse.   °· Diseases specific to muscles, joints, and bones.   °· Blood vessel disease where not enough blood is getting to the muscles.   °HOME CARE INSTRUCTIONS  °· Stay well hydrated. Drink enough water and fluids to keep your urine clear or pale yellow. °· It may be helpful to massage, stretch, and relax the affected muscle. °· For tight or tense muscles, use a warm towel, heating pad, or hot shower water directed to the affected area. °· If you are sore or have pain after a cramp or spasm, applying ice to the affected area may relieve discomfort. °¨ Put ice in a plastic bag. °¨ Place a towel between your skin and the bag. °¨ Leave the ice on for 15-20 minutes, 03-04 times a day. °· Medicines used to treat a known cause of cramps or spasms may help reduce their frequency or severity. Only take over-the-counter or prescription medicines as directed by your caregiver. °SEEK MEDICAL CARE IF:  °Your cramps or spasms get more severe, more frequent, or do not improve over time.  °MAKE SURE YOU:  °· Understand these instructions. °· Will watch your condition. °· Will get help right away if you are not doing well or get worse. °Document Released: 08/19/2001 Document Revised: 06/24/2012 Document Reviewed: 02/14/2012 °[employer]® Patient Information ©2015 [employer]. This information is not intended to replace advice given to you by your health care provider. Make sure you discuss any questions you have with your health care provider. ° °

## 2014-09-05 NOTE — ED Notes (Signed)
Pt states that he has bulging discs in his back, appt for cortisone shot on the 14th, aggravated at work the other day from walking on stairs, unrelieved by flexeril and antiinflammatory.

## 2017-08-17 DIAGNOSIS — Z23 Encounter for immunization: Secondary | ICD-10-CM | POA: Diagnosis not present

## 2017-08-17 DIAGNOSIS — R739 Hyperglycemia, unspecified: Secondary | ICD-10-CM | POA: Diagnosis not present

## 2017-08-17 DIAGNOSIS — I10 Essential (primary) hypertension: Secondary | ICD-10-CM | POA: Diagnosis not present

## 2017-08-17 DIAGNOSIS — E7800 Pure hypercholesterolemia, unspecified: Secondary | ICD-10-CM | POA: Diagnosis not present

## 2017-08-17 DIAGNOSIS — Z125 Encounter for screening for malignant neoplasm of prostate: Secondary | ICD-10-CM | POA: Diagnosis not present

## 2017-08-17 DIAGNOSIS — Z0000 Encounter for general adult medical examination without abnormal findings: Secondary | ICD-10-CM | POA: Diagnosis not present

## 2017-08-27 DIAGNOSIS — M545 Low back pain: Secondary | ICD-10-CM | POA: Diagnosis not present

## 2017-08-27 DIAGNOSIS — M4696 Unspecified inflammatory spondylopathy, lumbar region: Secondary | ICD-10-CM | POA: Diagnosis not present

## 2017-09-06 DIAGNOSIS — I10 Essential (primary) hypertension: Secondary | ICD-10-CM | POA: Diagnosis not present

## 2017-09-24 DIAGNOSIS — M47816 Spondylosis without myelopathy or radiculopathy, lumbar region: Secondary | ICD-10-CM | POA: Diagnosis not present

## 2017-09-27 DIAGNOSIS — E668 Other obesity: Secondary | ICD-10-CM | POA: Diagnosis not present

## 2017-09-27 DIAGNOSIS — I119 Hypertensive heart disease without heart failure: Secondary | ICD-10-CM | POA: Diagnosis not present

## 2017-09-27 DIAGNOSIS — E785 Hyperlipidemia, unspecified: Secondary | ICD-10-CM | POA: Diagnosis not present

## 2017-09-27 DIAGNOSIS — I2510 Atherosclerotic heart disease of native coronary artery without angina pectoris: Secondary | ICD-10-CM | POA: Diagnosis not present

## 2017-10-08 DIAGNOSIS — M109 Gout, unspecified: Secondary | ICD-10-CM | POA: Diagnosis not present

## 2017-10-08 DIAGNOSIS — N183 Chronic kidney disease, stage 3 (moderate): Secondary | ICD-10-CM | POA: Diagnosis not present

## 2017-10-08 DIAGNOSIS — I129 Hypertensive chronic kidney disease with stage 1 through stage 4 chronic kidney disease, or unspecified chronic kidney disease: Secondary | ICD-10-CM | POA: Diagnosis not present

## 2017-10-10 ENCOUNTER — Other Ambulatory Visit: Payer: Self-pay | Admitting: Nephrology

## 2017-10-10 DIAGNOSIS — I129 Hypertensive chronic kidney disease with stage 1 through stage 4 chronic kidney disease, or unspecified chronic kidney disease: Secondary | ICD-10-CM

## 2017-10-10 DIAGNOSIS — N183 Chronic kidney disease, stage 3 unspecified: Secondary | ICD-10-CM

## 2017-10-10 DIAGNOSIS — M1040 Other secondary gout, unspecified site: Secondary | ICD-10-CM

## 2017-10-17 ENCOUNTER — Ambulatory Visit
Admission: RE | Admit: 2017-10-17 | Discharge: 2017-10-17 | Disposition: A | Discharge status: Home / Self Care | Payer: BLUE CROSS/BLUE SHIELD | Source: Ambulatory Visit | Attending: Nephrology | Admitting: Nephrology

## 2017-10-17 DIAGNOSIS — M1040 Other secondary gout, unspecified site: Secondary | ICD-10-CM

## 2017-10-17 DIAGNOSIS — I129 Hypertensive chronic kidney disease with stage 1 through stage 4 chronic kidney disease, or unspecified chronic kidney disease: Secondary | ICD-10-CM

## 2017-10-17 DIAGNOSIS — N183 Chronic kidney disease, stage 3 unspecified: Secondary | ICD-10-CM

## 2017-10-18 DIAGNOSIS — M47816 Spondylosis without myelopathy or radiculopathy, lumbar region: Secondary | ICD-10-CM | POA: Diagnosis not present

## 2017-11-09 DIAGNOSIS — M47816 Spondylosis without myelopathy or radiculopathy, lumbar region: Secondary | ICD-10-CM | POA: Diagnosis not present

## 2017-11-09 DIAGNOSIS — M533 Sacrococcygeal disorders, not elsewhere classified: Secondary | ICD-10-CM | POA: Diagnosis not present

## 2017-12-06 DIAGNOSIS — M533 Sacrococcygeal disorders, not elsewhere classified: Secondary | ICD-10-CM | POA: Diagnosis not present

## 2017-12-25 DIAGNOSIS — N183 Chronic kidney disease, stage 3 (moderate): Secondary | ICD-10-CM | POA: Diagnosis not present

## 2018-01-04 DIAGNOSIS — E559 Vitamin D deficiency, unspecified: Secondary | ICD-10-CM | POA: Diagnosis not present

## 2018-01-04 DIAGNOSIS — D638 Anemia in other chronic diseases classified elsewhere: Secondary | ICD-10-CM | POA: Diagnosis not present

## 2018-01-04 DIAGNOSIS — N183 Chronic kidney disease, stage 3 (moderate): Secondary | ICD-10-CM | POA: Diagnosis not present

## 2018-01-04 DIAGNOSIS — I129 Hypertensive chronic kidney disease with stage 1 through stage 4 chronic kidney disease, or unspecified chronic kidney disease: Secondary | ICD-10-CM | POA: Diagnosis not present

## 2018-07-29 DIAGNOSIS — E559 Vitamin D deficiency, unspecified: Secondary | ICD-10-CM | POA: Diagnosis not present

## 2018-07-29 DIAGNOSIS — I129 Hypertensive chronic kidney disease with stage 1 through stage 4 chronic kidney disease, or unspecified chronic kidney disease: Secondary | ICD-10-CM | POA: Diagnosis not present

## 2018-07-29 DIAGNOSIS — D638 Anemia in other chronic diseases classified elsewhere: Secondary | ICD-10-CM | POA: Diagnosis not present

## 2018-07-29 DIAGNOSIS — N183 Chronic kidney disease, stage 3 (moderate): Secondary | ICD-10-CM | POA: Diagnosis not present

## 2018-08-09 DIAGNOSIS — G4733 Obstructive sleep apnea (adult) (pediatric): Secondary | ICD-10-CM | POA: Diagnosis not present

## 2018-09-27 DIAGNOSIS — R739 Hyperglycemia, unspecified: Secondary | ICD-10-CM | POA: Diagnosis not present

## 2018-09-27 DIAGNOSIS — Z0000 Encounter for general adult medical examination without abnormal findings: Secondary | ICD-10-CM | POA: Diagnosis not present

## 2018-09-27 DIAGNOSIS — Z125 Encounter for screening for malignant neoplasm of prostate: Secondary | ICD-10-CM | POA: Diagnosis not present

## 2018-09-27 DIAGNOSIS — E7800 Pure hypercholesterolemia, unspecified: Secondary | ICD-10-CM | POA: Diagnosis not present

## 2018-09-27 DIAGNOSIS — I10 Essential (primary) hypertension: Secondary | ICD-10-CM | POA: Diagnosis not present

## 2018-10-03 DIAGNOSIS — R05 Cough: Secondary | ICD-10-CM | POA: Diagnosis not present

## 2018-10-03 DIAGNOSIS — J3081 Allergic rhinitis due to animal (cat) (dog) hair and dander: Secondary | ICD-10-CM | POA: Diagnosis not present

## 2018-10-03 DIAGNOSIS — J3089 Other allergic rhinitis: Secondary | ICD-10-CM | POA: Diagnosis not present

## 2018-10-03 DIAGNOSIS — K219 Gastro-esophageal reflux disease without esophagitis: Secondary | ICD-10-CM | POA: Diagnosis not present

## 2018-11-28 DIAGNOSIS — G4733 Obstructive sleep apnea (adult) (pediatric): Secondary | ICD-10-CM | POA: Diagnosis not present

## 2019-02-10 DIAGNOSIS — I129 Hypertensive chronic kidney disease with stage 1 through stage 4 chronic kidney disease, or unspecified chronic kidney disease: Secondary | ICD-10-CM | POA: Diagnosis not present

## 2019-02-10 DIAGNOSIS — N183 Chronic kidney disease, stage 3 unspecified: Secondary | ICD-10-CM | POA: Diagnosis not present

## 2019-02-10 DIAGNOSIS — D638 Anemia in other chronic diseases classified elsewhere: Secondary | ICD-10-CM | POA: Diagnosis not present

## 2019-02-10 DIAGNOSIS — E559 Vitamin D deficiency, unspecified: Secondary | ICD-10-CM | POA: Diagnosis not present

## 2019-05-13 DIAGNOSIS — K219 Gastro-esophageal reflux disease without esophagitis: Secondary | ICD-10-CM | POA: Diagnosis not present

## 2019-05-13 DIAGNOSIS — R05 Cough: Secondary | ICD-10-CM | POA: Diagnosis not present

## 2019-05-13 DIAGNOSIS — J3089 Other allergic rhinitis: Secondary | ICD-10-CM | POA: Diagnosis not present

## 2019-05-13 DIAGNOSIS — J3081 Allergic rhinitis due to animal (cat) (dog) hair and dander: Secondary | ICD-10-CM | POA: Diagnosis not present

## 2019-05-23 DIAGNOSIS — Z23 Encounter for immunization: Secondary | ICD-10-CM | POA: Diagnosis not present

## 2019-06-20 DIAGNOSIS — Z23 Encounter for immunization: Secondary | ICD-10-CM | POA: Diagnosis not present

## 2019-10-13 DIAGNOSIS — Z0000 Encounter for general adult medical examination without abnormal findings: Secondary | ICD-10-CM | POA: Diagnosis not present

## 2019-10-13 DIAGNOSIS — R5382 Chronic fatigue, unspecified: Secondary | ICD-10-CM | POA: Diagnosis not present

## 2019-10-13 DIAGNOSIS — Z125 Encounter for screening for malignant neoplasm of prostate: Secondary | ICD-10-CM | POA: Diagnosis not present

## 2019-10-13 DIAGNOSIS — I10 Essential (primary) hypertension: Secondary | ICD-10-CM | POA: Diagnosis not present

## 2019-10-13 DIAGNOSIS — E7800 Pure hypercholesterolemia, unspecified: Secondary | ICD-10-CM | POA: Diagnosis not present

## 2019-10-20 DIAGNOSIS — G4733 Obstructive sleep apnea (adult) (pediatric): Secondary | ICD-10-CM | POA: Diagnosis not present

## 2019-10-27 DIAGNOSIS — E559 Vitamin D deficiency, unspecified: Secondary | ICD-10-CM | POA: Diagnosis not present

## 2019-10-27 DIAGNOSIS — N189 Chronic kidney disease, unspecified: Secondary | ICD-10-CM | POA: Diagnosis not present

## 2019-10-27 DIAGNOSIS — N1831 Chronic kidney disease, stage 3a: Secondary | ICD-10-CM | POA: Diagnosis not present

## 2019-10-27 DIAGNOSIS — M109 Gout, unspecified: Secondary | ICD-10-CM | POA: Diagnosis not present

## 2019-10-27 DIAGNOSIS — I129 Hypertensive chronic kidney disease with stage 1 through stage 4 chronic kidney disease, or unspecified chronic kidney disease: Secondary | ICD-10-CM | POA: Diagnosis not present

## 2019-10-27 DIAGNOSIS — D631 Anemia in chronic kidney disease: Secondary | ICD-10-CM | POA: Diagnosis not present

## 2019-11-16 HISTORY — PX: LEFT HEART CATH AND CORONARY ANGIOGRAPHY: CATH118249

## 2019-11-19 DIAGNOSIS — I129 Hypertensive chronic kidney disease with stage 1 through stage 4 chronic kidney disease, or unspecified chronic kidney disease: Secondary | ICD-10-CM | POA: Diagnosis not present

## 2019-11-30 ENCOUNTER — Encounter: Payer: Self-pay | Admitting: Cardiology

## 2019-12-01 ENCOUNTER — Ambulatory Visit (INDEPENDENT_AMBULATORY_CARE_PROVIDER_SITE_OTHER): Payer: BC Managed Care – PPO | Admitting: Cardiology

## 2019-12-01 ENCOUNTER — Encounter: Payer: Self-pay | Admitting: Cardiology

## 2019-12-01 ENCOUNTER — Other Ambulatory Visit: Payer: Self-pay

## 2019-12-01 ENCOUNTER — Encounter: Payer: Self-pay | Admitting: *Deleted

## 2019-12-01 VITALS — BP 130/62 | HR 63 | Ht 72.0 in | Wt 258.3 lb

## 2019-12-01 DIAGNOSIS — R9431 Abnormal electrocardiogram [ECG] [EKG]: Secondary | ICD-10-CM | POA: Diagnosis not present

## 2019-12-01 DIAGNOSIS — I2510 Atherosclerotic heart disease of native coronary artery without angina pectoris: Secondary | ICD-10-CM | POA: Diagnosis not present

## 2019-12-01 DIAGNOSIS — Z9861 Coronary angioplasty status: Secondary | ICD-10-CM

## 2019-12-01 DIAGNOSIS — I2129 ST elevation (STEMI) myocardial infarction involving other sites: Secondary | ICD-10-CM

## 2019-12-01 DIAGNOSIS — R079 Chest pain, unspecified: Secondary | ICD-10-CM

## 2019-12-01 DIAGNOSIS — E785 Hyperlipidemia, unspecified: Secondary | ICD-10-CM | POA: Diagnosis not present

## 2019-12-01 DIAGNOSIS — R002 Palpitations: Secondary | ICD-10-CM | POA: Insufficient documentation

## 2019-12-01 DIAGNOSIS — I10 Essential (primary) hypertension: Secondary | ICD-10-CM

## 2019-12-01 MED ORDER — PRAVASTATIN SODIUM 40 MG PO TABS: 40.0000 mg | ORAL_TABLET | Freq: Every evening | ORAL | 3 refills | Status: DC

## 2019-12-01 NOTE — Patient Instructions (Signed)
Medication Instructions:    stop taking pravastatin 10 mg   start  Taking pravastatin 40 mg  Daily   *If you need a refill on your cardiac medications before your next appointment, please call your pharmacy*   Lab Work: In 3 months Dec 2021  Please do labs - lipid hepatic panel - fasting  If you have labs (blood work) drawn today and your tests are completely normal, you will receive your results only by: [facility] Kitchen MyChart Message (if you have MyChart) OR . A paper copy in the mail If you have any lab test that is abnormal or we need to change your treatment, we will call you to review the results.   Testing/Procedures: Will be schedule at [place] has requested that you have a lexiscan myoview. Please follow instruction sheet, as given. And Your physician has recommended that you wear a  3 DAY ZIO-PATCH monitor. The Zio patch cardiac monitor continuously records heart rhythm data, this is for patients being evaluated for multiple types heart rhythms. For the first 24 hours post application, please avoid getting the Zio monitor wet in the shower or by excessive sweating during exercise. After that, feel free to carry on with regular activities. Keep soaps and lotions away from the ZIO XT Patch.  Someone will be calling you to let you know when this is mailed.  This will be mailed to you, please expect 7-10 days to receive.      Call [facility] at [phone] if you have questions regarding your ZIO XT patch monitor.  Call them immediately if you see an orange light blinking on your monitor.   If your monitor falls off in less than 4 days contact our Monitor department at [phone].  If your monitor becomes loose or falls off after 4 days call [employer] at [phone] for suggestions on securing your monitor    Follow-Up: At [facility], you and your health needs are our priority.  As part of our continuing mission to  provide you with exceptional heart care, we have created designated Provider Care Teams.  These Care Teams include your primary Cardiologist (physician) and Advanced Practice Providers (APPs -  Physician Assistants and Nurse Practitioners) who all work together to provide you with the care you need, when you need it.    Your next appointment:   7 week(s)  The format for your next appointment:   In Person  Provider:   Glenetta Hew, MD   Other Instructions  ZIO XT- Long Term Monitor Instructions   Your physician has requested you wear your ZIO patch monitor___3____days.   This is a single patch monitor.  [employer] supplies one patch monitor per enrollment.  Additional stickers are not available.   Please do not apply patch if you will be having a Nuclear Stress Test, Echocardiogram, Cardiac CT, MRI, or Chest Xray during the time frame you would be wearing the monitor. The patch cannot be worn during these tests.  You cannot remove and re-apply the ZIO XT patch monitor.   Your ZIO patch monitor will be sent USPS Priority mail from [facility] directly to your home address. The monitor may also be mailed to a PO BOX if home delivery is not available.   It may take 3-5 days to receive your monitor after you have been enrolled.   Once you have received you monitor, please review enclosed instructions.  Your monitor has already been registered assigning  a specific monitor serial # to you.   Applying the monitor   Shave hair from upper left chest.   Hold abrader disc by orange tab.  Rub abrader in 40 strokes over left upper chest as indicated in your monitor instructions.   Clean area with 4 enclosed alcohol pads .  Use all pads to assure are is cleaned thoroughly.  Let dry.   Apply patch as indicated in monitor instructions.  Patch will be place under collarbone on left side of chest with arrow pointing upward.   Rub patch adhesive wings for 2 minutes.Remove white label marked  "1".  Remove white label marked "2".  Rub patch adhesive wings for 2 additional minutes.   While looking in a mirror, press and release button in center of patch.  A small green light will flash 3-4 times .  This will be your only indicator the monitor has been turned on.     Do not shower for the first 24 hours.  You may shower after the first 24 hours.   Press button if you feel a symptom. You will hear a small click.  Record Date, Time and Symptom in the Patient Log Book.   When you are ready to remove patch, follow instructions on last 2 pages of Patient Log Book.  Stick patch monitor onto last page of Patient Log Book.   Place Patient Log Book in [place] box.  Use locking tab on box and tape box closed securely.  The [employer] and [employer] has [employer] on it.  Please place in mailbox as soon as possible.  Your physician should have your test results approximately 7 days after the monitor has been mailed back to [facility].   Call [facility] at [phone] if you have questions regarding your ZIO XT patch monitor.  Call them immediately if you see an orange light blinking on your monitor.   If your monitor falls off in less than 4 days contact our Monitor department at [phone].  If your monitor becomes loose or falls off after 4 days call [employer] at [phone] for suggestions on securing your monitor.

## 2019-12-01 NOTE — Progress Notes (Signed)
Patient ID: George Erickson, male   DOB: [birth year], 49 y.o.   MRN: [id number] Patient enrolled for [employer] to deliver a 3 day ZIO XT long term holter monitor to his home on  12/05/2019.  Patient to apply after nuclear medicine study.

## 2019-12-01 NOTE — Progress Notes (Signed)
Primary Care Provider: Jeric Small, MD Cardiologist: Glenetta Hew, MD; formerly patient of Dr. Wynonia Lawman since September 2011 Electrophysiologist: None  Clinic Note: Chief Complaint  Patient presents with  . New Patient (Initial Visit)    Reestablish cardiology care  . Coronary Artery Disease    Distant history of non-STEMI-PCI diagonal in 2011  . Chest Pain  . Palpitations   HPI:    George Erickson is a 49 y.o. male with a PMH notable for history of MI at age 50 (PCI to Diag) risk factors of hypertension, hyperlipidemia and obesity and OSA on CPAP) who is being seen today for the evaluation of CHEST PAIN & PALPITATIONS - & to [facility] at the request of Aneesh Small, MD.  George Erickson was last seen on October 13, 2019 by Dr. Justin Mend for routine wellness visit.  No major acute issues.  Had noted that he had not followed up with a cardiologist since Dr. Wynonia Lawman retired.  Recent Hospitalizations: None  Reviewed  CV studies:    The following studies were reviewed today: (if available, images/films reviewed: From Epic Chart or Care Everywhere) - PSH updated. ->  Extensive chart review -> Images reviewed.  When available echo images reviewed. --> 25 min . LEFT HEART CATH AND CORONARY ANGIOGRAPHY  11/16/2019   (Dr. Burt Knack): NSTEMI -- LM-< LAD & LCx. ~mLAD ~30-40% just past D1 (D1 - 100% - CULPRIT - PCI), LCx normal - 2 OM & LPL. Dom RCA with Large PDA (small PL), ~30% mRCA.  EF ~55% with Ant-Lat HK   . LEFT HEART CATH AND CORONARY ANGIOGRAPHY  11/22/2010   (Dr. Wynonia Lawman): Patent Diag 1 stent with minimal CAD elsewhere.  EF ~55-60% with Anterolateral HK  . LEFT HEART CATH AND CORONARY ANGIOGRAPHY  12/31/2013   [facility] - for Abnormal Myoview) --> Non-obstructive CAD with widely patent D1 stent.  LVEDP ~16 mmHg  . NASAL SINUS SURGERY    . TM MYOVIEW  12/2013   + TM - lateral ST depressions (also noted with recovery - near syncope) -> Reversible Anterior Wall Perfusion defect -  CRO Ischemia; Partialy reversible Inferior defect - ~ diaphragmatic attenuation. EF ~48%. --> Referred for CATH --> FALSE POSTIIVE  . TRANSTHORACIC ECHOCARDIOGRAM  07/08/2014   EF 60%. No RWMA. Mild LA dilation. Normal walves.    Interval History:   George Erickson presents here today to establish cardiology care but does have intermittent complaints of off and on chest pain.  He usually can walk about a mile a day but now about 1/4 mile he he started noticed some tightness and discomfort in his chest that goes into his left arm.  It can last a minute or 2 or even last night.  Usually stops relaxes it goes away.  It does not happen every time he exerts himself, but is there quite often.  Sometimes when he feels as he feels a little lightheaded and dizzy.  He also feels off and on irregular fast heart rate palpitation spells these seem to get worse when he exerts himself.  When he slows down it takes a while for his heart rate come back down to normal.  He knows that his lipid levels have gone up, indicating he change his diet and was hoping that by adjusting his diet back to where he had been but improved some.  CV Review of Symptoms (Summary): positive for - chest pain, dyspnea on exertion, irregular heartbeat, palpitations and rapid heart rate negative  for - edema, orthopnea, paroxysmal nocturnal dyspnea, shortness of breath or Although he gets lightheaded and dizzy on occasion, no syncope or near syncope.  No TIA or amaurosis fugax.  No claudication.  The patient does not have symptoms concerning for COVID-19 infection (fever, chills, cough, or new shortness of breath).   REVIEWED OF SYSTEMS   Review of Systems  Constitutional: Negative for malaise/fatigue and weight loss (Actually gained weight during Covid lockdown.).  HENT: Negative for congestion and nosebleeds.   Respiratory: Negative for cough, shortness of breath and wheezing.   Cardiovascular: Negative for leg swelling.   Gastrointestinal: Negative for blood in stool and melena.  Genitourinary: Negative for hematuria.  Musculoskeletal: Negative for joint pain and myalgias.  Neurological: Positive for dizziness (With palpitations). Negative for focal weakness.  Psychiatric/Behavioral: Negative.     I have reviewed and (if needed) personally updated the patient's problem list, medications, allergies, past medical and surgical history, social and family history.   PAST MEDICAL HISTORY   Past Medical History:  Diagnosis Date  . Allergic rhinitis due to pollen   . Arthritis    lower back  . CAD S/P DES PCI D1 11/15/2009   NSTEMI - 100% D1 (DES PCI - Promus DES 2.25 x 23 -- 2.3 mm); otherwise minimal CAD in a co-dominant system  . Chronic left shoulder pain    Impingement syndrome  . Erectile dysfunction   . Gout   . Hyperlipidemia with target LDL less than 70    Associated CAD  . Hypertension   . Hypertensive kidney disease with CKD stage IV ([place])    Cr as of August 2021 1.92  . Non-ST elevation (NSTEMI) myocardial infarction ([place]) 11/14/2009   Cath - 100% D1 --> DES PCI Promus 2.25 x 23 (2.3 mm).  EF 55% with anterolateral HK. -> patent stent in Sept 2012  . Obesity (BMI 30.0-34.9)   . OSA on CPAP    uses cpap  . Vertigo     PAST SURGICAL HISTORY   Past Surgical History:  Procedure Laterality Date  . CORONARY STENT PLACEMENT  11/15/2009   (Dr. Burt Knack) -- DES PCI of 100% D1 - Promus DES 2.25 x 23 --> 2.3 mm  . HERNIA REPAIR     hernia repair at birth  . LEFT HEART CATH AND CORONARY ANGIOGRAPHY  11/16/2019   (Dr. Burt Knack): NSTEMI -- LM-< LAD & LCx. ~mLAD ~30-40% just past D1 (D1 - 100% - CULPRIT - PCI), LCx normal - 2 OM & LPL. Dom RCA with Large PDA (small PL), ~30% mRCA.  EF ~55% with Ant-Lat HK   . LEFT HEART CATH AND CORONARY ANGIOGRAPHY  11/22/2010   (Dr. Wynonia Lawman): Patent Diag 1 stent with minimal CAD elsewhere.  EF ~55-60% with Anterolateral HK  . LEFT HEART CATH AND CORONARY ANGIOGRAPHY   12/31/2013   [facility] - for Abnormal Myoview) --> Non-obstructive CAD with widely patent D1 stent.  LVEDP ~16 mmHg  . NASAL SINUS SURGERY  2010   For sleep apnea  . TM MYOVIEW  12/2013   (FALSE POSITIVE) + TM - lateral ST depressions (also noted with recovery - near syncope) -> Reversible Anterior Wall Perfusion defect - CRO Ischemia; Partialy reversible Inferior defect - ~ diaphragmatic attenuation. EF ~48%. --> Referred for CATH --> FALSE POSTIIVE  . TRANSTHORACIC ECHOCARDIOGRAM  07/08/2014   EF 60%. No RWMA. Mild LA dilation. Normal walves.    There is no immunization history on file for this patient.  Covid Vaccine Levan Hurst) -  3/12; 4/9 - [facility] [place], [place]  MEDICATIONS/ALLERGIES   Current Meds  Medication Sig  . allopurinol (ZYLOPRIM) 100 MG tablet Take 200 mg by mouth daily.  Marland Kitchen amLODipine (NORVASC) 10 MG tablet Take 10 mg by mouth daily.  Marland Kitchen aspirin EC 81 MG tablet Take 81 mg by mouth daily.  . Azelastine HCl 137 MCG/SPRAY SOLN Place 2 sprays into both nostrils in the morning and at bedtime.  . cholecalciferol (VITAMIN D3) 25 MCG (1000 UNIT) tablet Take 2,000 Units by mouth daily.  . famotidine (PEPCID) 20 MG tablet Take 20 mg by mouth at bedtime as needed.  . fluticasone (FLONASE) 50 MCG/ACT nasal spray Place 2 sprays into the nose daily.  . furosemide (LASIX) 40 MG tablet Take 40 mg by mouth daily.  . hydrALAZINE (APRESOLINE) 50 MG tablet Take 50 mg by mouth 2 (two) times daily.  Marland Kitchen losartan (COZAAR) 50 MG tablet Take 50 mg by mouth daily.  . metoprolol tartrate (LOPRESSOR) 25 MG tablet Take 25 mg by mouth daily.  . montelukast (SINGULAIR) 10 MG tablet Take 10 mg by mouth daily.  . [DISCONTINUED] pravastatin (PRAVACHOL) 10 MG tablet Take 10 mg by mouth daily.    Allergies  Allergen Reactions  . Niacin And Related Itching  . Zocor [Simvastatin-High Dose]     Muscle aches    SOCIAL HISTORY/FAMILY HISTORY   Reviewed in Epic:  Pertinent findings:   Social History   Tobacco Use  . Smoking status: Never Smoker  . Smokeless tobacco: Never Used  Substance Use Topics  . Alcohol use: Yes    Alcohol/week: 1.0 standard drink    Types: 1 Standard drinks or equivalent per week    Comment: Rare, Occasional glass of wine  . Drug use: No   Social History   Social History Narrative   He is a married father of 2 girls.   Public relations account executive   Lifelong non-smoker.   Rare glass of wine   Minimal caffeine intake, no longer drinks coffee only drinks intermittent sweet tea.  Rarely would have a Sprite.      Walks daily at work, does not get routine exercise.   Family History  Problem Relation Age of Onset  . Hypertension Mother   . Coronary artery disease Mother 74       s/p CABG  . Diabetes Mellitus II Mother   . Hyperlipidemia Mother   . Hypertension Father   . Heart attack Father 37       Had several after  . Diabetes Mellitus II Father   . Heart failure Father   . CVA Father 35  . Hyperlipidemia Father   . CVA Sister   . Hypothyroidism Sister   . Hypertension Sister   . Healthy Brother   . Healthy Brother   . Hypertension Sister   . Heart attack Sister      OBJCTIVE -PE, EKG, labs   Wt Readings from Last 3 Encounters:  12/01/19 258 lb 4.8 oz (117.2 kg)  09/05/14 240 lb (108.9 kg)  07/14/14 237 lb 11.2 oz (107.8 kg)    Physical Exam: BP 130/62   Pulse 63   Ht 6' (1.829 m)   Wt 258 lb 4.8 oz (117.2 kg)   SpO2 96%   BMI 35.03 kg/m  Physical Exam Vitals reviewed.  Constitutional:      General: He is not in acute distress.    Appearance: Normal appearance. He is obese. He is not ill-appearing.  Comments: Well-groomed.  Healthy-appearing  HENT:     Head: Normocephalic and atraumatic.  Neck:     Vascular: No carotid bruit, hepatojugular reflux or JVD.  Cardiovascular:     Rate and Rhythm: Normal rate and regular rhythm.     Chest Wall: PMI is not displaced (Difficult to palpate  due to obesity).     Pulses: Normal pulses.     Heart sounds: S1 normal and S2 normal. Heart sounds are distant. No murmur heard.  No friction rub. No gallop.   Pulmonary:     Effort: Pulmonary effort is normal. No respiratory distress.     Breath sounds: Normal breath sounds.  Chest:     Chest wall: No tenderness.  Abdominal:     General: Abdomen is flat. Bowel sounds are normal. There is no distension.     Palpations: Abdomen is soft. There is no mass.     Tenderness: There is no abdominal tenderness.     Comments: Obese.  No HSM  Musculoskeletal:        General: Swelling (Trivial) present. Normal range of motion.     Cervical back: Normal range of motion.  Neurological:     General: No focal deficit present.     Mental Status: He is alert and oriented to person, place, and time.     Cranial Nerves: No cranial nerve deficit.  Psychiatric:        Mood and Affect: Mood normal.        Thought Content: Thought content normal.        Judgment: Judgment normal.     Adult ECG Report  Rate: 63 ;  Rhythm: normal sinus rhythm and T wave inversions in lateral leads, cannot exclude ischemia;   Narrative Interpretation: Normal axis, intervals and durations.  (2 inversions do not appear to be new)  Recent Labs:   10/13/2019 Na+ 141, K+ 3.9, Cl- 107, HCO3-28, BUN 18, Cr 1.92, Glu 127, Ca2+ 9.1; AST 17, ALT 15, AlkP 67 CBC: W 10.2, H/H 12.4/34.7, Plt 324; TSH 1.46; (A1c 4.7 September 27, 2018) TC 167, TG 115, HDL 41, LDL 105   Lab Results  Component Value Date   CREATININE 1.11 07/14/2014   BUN 10 07/13/2014   NA 140 07/13/2014   K 3.8 07/13/2014   CL 102 07/13/2014   CO2 26 07/13/2014   Lab Results  Component Value Date   TSH 0.259 (L) 07/07/2014    ASSESSMENT/PLAN    Problem List Items Addressed This Visit    ST elevation myocardial infarction (STEMI) of lateral wall, subsequent episode of care ([place]) (Chronic)    10 years out from his MI.  He is now having chest discomfort which  is somewhat reminiscent to him, but he has had this before leading to heart catheterization since his PCI.    We will recheck ischemic evaluation with Myoview.       Relevant Medications   metoprolol tartrate (LOPRESSOR) 25 MG tablet   amLODipine (NORVASC) 10 MG tablet   furosemide (LASIX) 40 MG tablet   hydrALAZINE (APRESOLINE) 50 MG tablet   losartan (COZAAR) 50 MG tablet   pravastatin (PRAVACHOL) 40 MG tablet   Other Relevant Orders   EKG 12-Lead (Completed)   MYOCARDIAL PERFUSION IMAGING   LONG TERM MONITOR (3-14 DAYS)   Hepatic function panel   Lipid panel   CAD S/P percutaneous coronary angioplasty - Primary (Chronic)    PCI of the diagonal 10 years ago seen as patent on  to follow-up cath.  Now complaining of chest discomfort.  He also history of false positive nuclear stress test making it somewhat worrisome when he comes checking a stress test here.   I would like to see his exercise tolerance and potential palpitations associated with exercise, unfortunately an EKG un-interpretable for ischemia  Could consider coronary CTA however I am concerned with the presence of a stent.   Plan: We will check a Lexiscan Myoview   Remains on aspirin alone  On pravastatin which based on his most recent lipids will probably need to be changed to a more potent statin (could either increase dose or change to rosuvastatin based on follow-up lipid panel)  He is on low-dose metoprolol which probably cannot titrate further with resting heart rate 63, along with amlodipine for antianginal  On additional doses of hydralazine and losartan for hypertension along with standing dose of Lasix.        Relevant Medications   metoprolol tartrate (LOPRESSOR) 25 MG tablet   amLODipine (NORVASC) 10 MG tablet   furosemide (LASIX) 40 MG tablet   hydrALAZINE (APRESOLINE) 50 MG tablet   losartan (COZAAR) 50 MG tablet   pravastatin (PRAVACHOL) 40 MG tablet   Other Relevant Orders   EKG 12-Lead  (Completed)   MYOCARDIAL PERFUSION IMAGING   LONG TERM MONITOR (3-14 DAYS)   Hepatic function panel   Lipid panel   Hyperlipidemia with target LDL less than 70 (Chronic)    Currently on low-dose pravastatin.  I suspect he is having to be on more.  Most recent lipid panel shows LDL of 105.  He said that he needs to make dietary adjustments that make sense.  I think with weight loss we can get quite a distance, however I suspect he still will need more potent medication.  -> For now, will increase pravastatin to 40 mg.  Has simvastatin listed as a "allergy "if additional potency is required, would probably change to rosuvastatin; may require PCSK9 100.      Relevant Medications   metoprolol tartrate (LOPRESSOR) 25 MG tablet   amLODipine (NORVASC) 10 MG tablet   furosemide (LASIX) 40 MG tablet   hydrALAZINE (APRESOLINE) 50 MG tablet   losartan (COZAAR) 50 MG tablet   pravastatin (PRAVACHOL) 40 MG tablet   Other Relevant Orders   Hepatic function panel   Lipid panel   Essential hypertension (Chronic)    Blood pressure looks pretty well controlled today but he is on multiple medications.  Furosemide is being used as his standing dose diuretic along with losartan hydralazine metoprolol and amlodipine.  We have room to titrate hydralazine and losartan, but with resting heart rate 63 would likely not need to titrate up beta-blocker unless we switch to carvedilol.      Relevant Medications   metoprolol tartrate (LOPRESSOR) 25 MG tablet   amLODipine (NORVASC) 10 MG tablet   furosemide (LASIX) 40 MG tablet   hydrALAZINE (APRESOLINE) 50 MG tablet   losartan (COZAAR) 50 MG tablet   pravastatin (PRAVACHOL) 40 MG tablet   Left-sided chest pain    He is having some chest discomfort worse to me has some features concerning for angina, however he has had similar symptoms in the last 10 years that have been evaluated similarly.  Based on one occasion he had a false positive stress test.  We will  recheck a Myoview stress test being careful to taken to consideration existing disease.      Pain in the chest  Refer to left-sided chest pain      Relevant Orders   MYOCARDIAL PERFUSION IMAGING   Palpitations - with exertion    He is noticing irregular fast heart rate spells.  Some dizziness associated.  They seem to get worse with exertion so I would like to do a treadmill portion of the stress test, unfortunately the T wave inversions on EKG make it nondiagnostic.  I worry that the reading physician would not take into consideration desire to see palpitations.  Plan: Zio patch monitor x3 days      Relevant Orders   MYOCARDIAL PERFUSION IMAGING   LONG TERM MONITOR (3-14 DAYS)   Nonspecific abnormal electrocardiogram (ECG) (EKG)   Relevant Orders   EKG 12-Lead (Completed)   MYOCARDIAL PERFUSION IMAGING       COVID-19 Education: The signs and symptoms of COVID-19 were discussed with the patient and how to seek care for testing (follow up with PCP or arrange E-visit).   The importance of social distancing and COVID-19 vaccination was discussed today. The patient is practicing social distancing & Masking.   I spent a total of 32 minutes with the patient spent in direct patient consultation.  Additional time spent with chart review  / charting (studies, outside notes, etc): 30 Total Time: 62 min   Current medicines are reviewed at length with the patient today.  (+/- concerns) n/a  Notice: This dictation was prepared with Dragon dictation along with smaller phrase technology. Any transcriptional errors that result from this process are unintentional and may not be corrected upon review.  Patient Instructions / Medication Changes & Studies & Tests Ordered   Patient Instructions  Medication Instructions:    stop taking pravastatin 10 mg   start  Taking pravastatin 40 mg  Daily   *If you need a refill on your cardiac medications before your next appointment, please call  your pharmacy*   Lab Work: In 3 months Dec 2021  Please do labs - lipid hepatic panel - fasting  If you have labs (blood work) drawn today and your tests are completely normal, you will receive your results only by: Marland Kitchen MyChart Message (if you have MyChart) OR . A paper copy in the mail If you have any lab test that is abnormal or we need to change your treatment, we will call you to review the results.   Testing/Procedures: Will be schedule at [place] has requested that you have a lexiscan myoview. Please follow instruction sheet, as given. And Your physician has recommended that you wear a  3 DAY ZIO-PATCH monitor. The Zio patch cardiac monitor continuously records heart rhythm data, this is for patients being evaluated for multiple types heart rhythms. For the first 24 hours post application, please avoid getting the Zio monitor wet in the shower or by excessive sweating during exercise. After that, feel free to carry on with regular activities. Keep soaps and lotions away from the ZIO XT Patch.  Someone will be calling you to let you know when this is mailed.  This will be mailed to you, please expect 7-10 days to receive.      Call [facility] at [phone] if you have questions regarding your ZIO XT patch monitor.  Call them immediately if you see an orange light blinking on your monitor.   If your monitor falls off in less than 4 days contact our Monitor department at [phone].  If your monitor becomes loose or  falls off after 4 days call [employer] at [phone] for suggestions on securing your monitor    Follow-Up: At [facility], you and your health needs are our priority.  As part of our continuing mission to provide you with exceptional heart care, we have created designated Provider Care Teams.  These Care Teams include your primary Cardiologist (physician) and Advanced Practice Providers (APPs -   Physician Assistants and Nurse Practitioners) who all work together to provide you with the care you need, when you need it.    Your next appointment:   7 week(s)  The format for your next appointment:   In Person  Provider:   Glenetta Hew, MD   Other Instructions  ZIO XT- Long Term Monitor Instructions   Your physician has requested you wear your ZIO patch monitor___3____days.   This is a single patch monitor.  [employer] supplies one patch monitor per enrollment.  Additional stickers are not available.   Please do not apply patch if you will be having a Nuclear Stress Test, Echocardiogram, Cardiac CT, MRI, or Chest Xray during the time frame you would be wearing the monitor. The patch cannot be worn during these tests.  You cannot remove and re-apply the ZIO XT patch monitor.   Your ZIO patch monitor will be sent USPS Priority mail from [facility] directly to your home address. The monitor may also be mailed to a PO BOX if home delivery is not available.   It may take 3-5 days to receive your monitor after you have been enrolled.   Once you have received you monitor, please review enclosed instructions.  Your monitor has already been registered assigning a specific monitor serial # to you.   Applying the monitor   Shave hair from upper left chest.   Hold abrader disc by orange tab.  Rub abrader in 40 strokes over left upper chest as indicated in your monitor instructions.   Clean area with 4 enclosed alcohol pads .  Use all pads to assure are is cleaned thoroughly.  Let dry.   Apply patch as indicated in monitor instructions.  Patch will be place under collarbone on left side of chest with arrow pointing upward.   Rub patch adhesive wings for 2 minutes.Remove white label marked "1".  Remove white label marked "2".  Rub patch adhesive wings for 2 additional minutes.   While looking in a mirror, press and release button in center of patch.  A small green light will  flash 3-4 times .  This will be your only indicator the monitor has been turned on.     Do not shower for the first 24 hours.  You may shower after the first 24 hours.   Press button if you feel a symptom. You will hear a small click.  Record Date, Time and Symptom in the Patient Log Book.   When you are ready to remove patch, follow instructions on last 2 pages of Patient Log Book.  Stick patch monitor onto last page of Patient Log Book.   Place Patient Log Book in [facility] box.  Use locking tab on box and tape box closed securely.  The [employer] and [employer] has [employer] on it.  Please place in mailbox as soon as possible.  Your physician should have your test results approximately 7 days after the monitor has been mailed back to [facility].   Call [facility] at [phone] if you have questions regarding your ZIO XT patch  monitor.  Call them immediately if you see an orange light blinking on your monitor.   If your monitor falls off in less than 4 days contact our Monitor department at [phone].  If your monitor becomes loose or falls off after 4 days call [employer] at [phone] for suggestions on securing your monitor.      Studies Ordered:   Orders Placed This Encounter  Procedures  . Hepatic function panel  . Lipid panel  . MYOCARDIAL PERFUSION IMAGING  . LONG TERM MONITOR (3-14 DAYS)  . EKG 12-Lead     Glenetta Hew, M.D., M.S. Interventional Cardiologist   Pager # [phone] Phone # [phone] [address] [place], [place] [address]   Thank you for choosing [facility] at [facility]!!

## 2019-12-03 ENCOUNTER — Telehealth (HOSPITAL_COMMUNITY): Payer: Self-pay | Admitting: *Deleted

## 2019-12-03 NOTE — Telephone Encounter (Signed)
Left message on voicemail per DPR in reference to upcoming appointment scheduled on 12/05/19 at 10:15 with detailed instructions given per Myocardial Perfusion Study Information Sheet for the test. LM to arrive 15 minutes early, and that it is imperative to arrive on time for appointment to keep from having the test rescheduled. If you need to cancel or reschedule your appointment, please call the office within 24 hours of your appointment. Failure to do so may result in a cancellation of your appointment, and a $50 no show fee. Phone number given for call back for any questions.

## 2019-12-04 ENCOUNTER — Encounter: Payer: Self-pay | Admitting: Cardiology

## 2019-12-04 NOTE — Assessment & Plan Note (Signed)
He is having some chest discomfort worse to me has some features concerning for angina, however he has had similar symptoms in the last 10 years that have been evaluated similarly.  Based on one occasion he had a false positive stress test.  We will recheck a Myoview stress test being careful to taken to consideration existing disease.

## 2019-12-04 NOTE — Assessment & Plan Note (Addendum)
Refer to left-sided chest pain

## 2019-12-04 NOTE — Assessment & Plan Note (Signed)
10 years out from his MI.  He is now having chest discomfort which is somewhat reminiscent to him, but he has had this before leading to heart catheterization since his PCI.    We will recheck ischemic evaluation with Myoview.

## 2019-12-04 NOTE — Assessment & Plan Note (Signed)
PCI of the diagonal 10 years ago seen as patent on to follow-up cath.  Now complaining of chest discomfort.  He also history of false positive nuclear stress test making it somewhat worrisome when he comes checking a stress test here.   I would like to see his exercise tolerance and potential palpitations associated with exercise, unfortunately an EKG un-interpretable for ischemia  Could consider coronary CTA however I am concerned with the presence of a stent.   Plan: We will check a Lexiscan Myoview   Remains on aspirin alone  On pravastatin which based on his most recent lipids will probably need to be changed to a more potent statin (could either increase dose or change to rosuvastatin based on follow-up lipid panel)  He is on low-dose metoprolol which probably cannot titrate further with resting heart rate 63, along with amlodipine for antianginal  On additional doses of hydralazine and losartan for hypertension along with standing dose of Lasix.

## 2019-12-04 NOTE — Assessment & Plan Note (Signed)
Blood pressure looks pretty well controlled today but he is on multiple medications.  Furosemide is being used as his standing dose diuretic along with losartan hydralazine metoprolol and amlodipine.  We have room to titrate hydralazine and losartan, but with resting heart rate 63 would likely not need to titrate up beta-blocker unless we switch to carvedilol.

## 2019-12-04 NOTE — Assessment & Plan Note (Addendum)
Currently on low-dose pravastatin.  I suspect he is having to be on more.  Most recent lipid panel shows LDL of 105.  He said that he needs to make dietary adjustments that make sense.  I think with weight loss we can get quite a distance, however I suspect he still will need more potent medication.  -> For now, will increase pravastatin to 40 mg.  Has simvastatin listed as a "allergy "if additional potency is required, would probably change to rosuvastatin; may require PCSK9 100.

## 2019-12-04 NOTE — Assessment & Plan Note (Signed)
He is noticing irregular fast heart rate spells.  Some dizziness associated.  They seem to get worse with exertion so I would like to do a treadmill portion of the stress test, unfortunately the T wave inversions on EKG make it nondiagnostic.  I worry that the reading physician would not take into consideration desire to see palpitations.  Plan: Zio patch monitor x3 days

## 2019-12-05 ENCOUNTER — Ambulatory Visit (INDEPENDENT_AMBULATORY_CARE_PROVIDER_SITE_OTHER): Payer: BC Managed Care – PPO

## 2019-12-05 ENCOUNTER — Other Ambulatory Visit: Payer: Self-pay

## 2019-12-05 ENCOUNTER — Ambulatory Visit (HOSPITAL_COMMUNITY): Payer: BC Managed Care – PPO | Attending: Cardiology

## 2019-12-05 DIAGNOSIS — I2510 Atherosclerotic heart disease of native coronary artery without angina pectoris: Secondary | ICD-10-CM | POA: Diagnosis not present

## 2019-12-05 DIAGNOSIS — I2129 ST elevation (STEMI) myocardial infarction involving other sites: Secondary | ICD-10-CM | POA: Insufficient documentation

## 2019-12-05 DIAGNOSIS — Z9861 Coronary angioplasty status: Secondary | ICD-10-CM | POA: Diagnosis not present

## 2019-12-05 DIAGNOSIS — R9431 Abnormal electrocardiogram [ECG] [EKG]: Secondary | ICD-10-CM | POA: Diagnosis not present

## 2019-12-05 DIAGNOSIS — R079 Chest pain, unspecified: Secondary | ICD-10-CM | POA: Diagnosis not present

## 2019-12-05 DIAGNOSIS — R002 Palpitations: Secondary | ICD-10-CM

## 2019-12-05 HISTORY — PX: NM MYOVIEW LTD: HXRAD82

## 2019-12-05 LAB — MYOCARDIAL PERFUSION IMAGING
LV dias vol: 161 mL (ref 62–150)
LV sys vol: 56 mL
Peak HR: 98 {beats}/min
Rest HR: 57 {beats}/min
SDS: 1
SRS: 2
SSS: 3
TID: 1.04

## 2019-12-05 MED ORDER — TECHNETIUM TC 99M TETROFOSMIN IV KIT
32.2000 | PACK | Freq: Once | INTRAVENOUS | Status: AC | PRN
  Administered 2019-12-05: 32.2 via INTRAVENOUS
  Filled 2019-12-05: qty 33

## 2019-12-05 MED ORDER — REGADENOSON 0.4 MG/5ML IV SOLN
0.4000 mg | Freq: Once | INTRAVENOUS | Status: AC
  Administered 2019-12-05: 0.4 mg via INTRAVENOUS

## 2019-12-05 MED ORDER — TECHNETIUM TC 99M TETROFOSMIN IV KIT
9.8000 | PACK | Freq: Once | INTRAVENOUS | Status: AC | PRN
  Administered 2019-12-05: 9.8 via INTRAVENOUS
  Filled 2019-12-05: qty 10

## 2019-12-12 DIAGNOSIS — R002 Palpitations: Secondary | ICD-10-CM | POA: Diagnosis not present

## 2019-12-12 DIAGNOSIS — I2129 ST elevation (STEMI) myocardial infarction involving other sites: Secondary | ICD-10-CM | POA: Diagnosis not present

## 2019-12-12 DIAGNOSIS — Z9861 Coronary angioplasty status: Secondary | ICD-10-CM | POA: Diagnosis not present

## 2019-12-17 DIAGNOSIS — L509 Urticaria, unspecified: Secondary | ICD-10-CM | POA: Diagnosis not present

## 2019-12-24 DIAGNOSIS — K219 Gastro-esophageal reflux disease without esophagitis: Secondary | ICD-10-CM | POA: Diagnosis not present

## 2019-12-24 DIAGNOSIS — J3089 Other allergic rhinitis: Secondary | ICD-10-CM | POA: Diagnosis not present

## 2019-12-24 DIAGNOSIS — J3081 Allergic rhinitis due to animal (cat) (dog) hair and dander: Secondary | ICD-10-CM | POA: Diagnosis not present

## 2019-12-24 DIAGNOSIS — R059 Cough, unspecified: Secondary | ICD-10-CM | POA: Diagnosis not present

## 2020-01-23 ENCOUNTER — Encounter: Payer: Self-pay | Admitting: Cardiology

## 2020-01-23 ENCOUNTER — Ambulatory Visit (INDEPENDENT_AMBULATORY_CARE_PROVIDER_SITE_OTHER): Payer: BC Managed Care – PPO | Admitting: Cardiology

## 2020-01-23 VITALS — BP 150/84 | HR 60 | Ht 71.0 in | Wt 254.2 lb

## 2020-01-23 DIAGNOSIS — I2510 Atherosclerotic heart disease of native coronary artery without angina pectoris: Secondary | ICD-10-CM | POA: Diagnosis not present

## 2020-01-23 DIAGNOSIS — Z9861 Coronary angioplasty status: Secondary | ICD-10-CM

## 2020-01-23 DIAGNOSIS — R9439 Abnormal result of other cardiovascular function study: Secondary | ICD-10-CM

## 2020-01-23 DIAGNOSIS — N1831 Chronic kidney disease, stage 3a: Secondary | ICD-10-CM

## 2020-01-23 DIAGNOSIS — R002 Palpitations: Secondary | ICD-10-CM

## 2020-01-23 DIAGNOSIS — I10 Essential (primary) hypertension: Secondary | ICD-10-CM

## 2020-01-23 DIAGNOSIS — I200 Unstable angina: Secondary | ICD-10-CM | POA: Insufficient documentation

## 2020-01-23 DIAGNOSIS — E785 Hyperlipidemia, unspecified: Secondary | ICD-10-CM

## 2020-01-23 DIAGNOSIS — R739 Hyperglycemia, unspecified: Secondary | ICD-10-CM

## 2020-01-23 MED ORDER — HYDRALAZINE HCL 100 MG PO TABS: 100.0000 mg | ORAL_TABLET | Freq: Two times a day (BID) | ORAL | 6 refills | Status: DC

## 2020-01-23 MED ORDER — ISOSORBIDE MONONITRATE ER 30 MG PO TB24: 30.0000 mg | ORAL_TABLET | Freq: Every day | ORAL | 6 refills | Status: DC

## 2020-01-23 MED ORDER — NITROGLYCERIN 0.4 MG SL SUBL: 0.4000 mg | SUBLINGUAL_TABLET | SUBLINGUAL | 6 refills | Status: DC | PRN

## 2020-01-23 NOTE — Patient Instructions (Addendum)
Medication Instructions:    Increase  hydralazine to 100 mg twice a day   Start taking Imdur ( isosorbide mono. ) 30 mg  Take at bedtime one tablet  May Nitroglycerin 0.4 mg as need for chest discomfort   Nitroglycerin sublingual tablets What is this medicine? NITROGLYCERIN (nye troe GLI ser in) is a type of vasodilator. It relaxes blood vessels, increasing the blood and oxygen supply to your heart. This medicine is used to relieve chest pain caused by angina. It is also used to prevent chest pain before activities like climbing stairs, going outdoors in cold weather, or sexual activity. This medicine may be used for other purposes; ask your health care provider or pharmacist if you have questions. COMMON BRAND NAME(S): Nitroquick, Nitrostat, Nitrotab What should I tell my health care provider before I take this medicine? They need to know if you have any of these conditions:  anemia  head injury, recent stroke, or bleeding in the brain  liver disease  previous heart attack  an unusual or allergic reaction to nitroglycerin, other medicines, foods, dyes, or preservatives  pregnant or trying to get pregnant  breast-feeding How should I use this medicine? Take this medicine by mouth as needed. At the first sign of an angina attack (chest pain or tightness) place one tablet under your tongue. You can also take this medicine 5 to 10 minutes before an event likely to produce chest pain. Follow the directions on the prescription label. Let the tablet dissolve under the tongue. Do not swallow whole. Replace the dose if you accidentally swallow it. It will help if your mouth is not dry. Saliva around the tablet will help it to dissolve more quickly. Do not eat or drink, smoke or chew tobacco while a tablet is dissolving. If you are not better within 5 minutes after taking ONE dose of nitroglycerin, call 9-1-1 immediately to seek emergency medical care. Do not take more than 3 nitroglycerin  tablets over 15 minutes. If you take this medicine often to relieve symptoms of angina, your doctor or health care professional may provide you with different instructions to manage your symptoms. If symptoms do not go away after following these instructions, it is important to call 9-1-1 immediately. Do not take more than 3 nitroglycerin tablets over 15 minutes. Talk to your pediatrician regarding the use of this medicine in children. Special care may be needed. Overdosage: If you think you have taken too much of this medicine contact a poison control center or emergency room at once. NOTE: This medicine is only for you. Do not share this medicine with others. What if I miss a dose? This does not apply. This medicine is only used as needed. What may interact with this medicine? Do not take this medicine with any of the following medications:  certain migraine medicines like ergotamine and dihydroergotamine (DHE)  medicines used to treat erectile dysfunction like sildenafil, tadalafil, and vardenafil  riociguat This medicine may also interact with the following medications:  alteplase  aspirin  heparin  medicines for high blood pressure  medicines for mental depression  other medicines used to treat angina  phenothiazines like chlorpromazine, mesoridazine, prochlorperazine, thioridazine This list may not describe all possible interactions. Give your health care provider a list of all the medicines, herbs, non-prescription drugs, or dietary supplements you use. Also tell them if you smoke, drink alcohol, or use illegal drugs. Some items may interact with your medicine. What should I watch for while using this medicine?  Tell your doctor or health care professional if you feel your medicine is no longer working. Keep this medicine with you at all times. Sit or lie down when you take your medicine to prevent falling if you feel dizzy or faint after using it. Try to remain calm. This will  help you to feel better faster. If you feel dizzy, take several deep breaths and lie down with your feet propped up, or bend forward with your head resting between your knees. You may get drowsy or dizzy. Do not drive, use machinery, or do anything that needs mental alertness until you know how this drug affects you. Do not stand or sit up quickly, especially if you are an older patient. This reduces the risk of dizzy or fainting spells. Alcohol can make you more drowsy and dizzy. Avoid alcoholic drinks. Do not treat yourself for coughs, colds, or pain while you are taking this medicine without asking your doctor or health care professional for advice. Some ingredients may increase your blood pressure. What side effects may I notice from receiving this medicine? Side effects that you should report to your doctor or health care professional as soon as possible:  blurred vision  dry mouth  skin rash  sweating  the feeling of extreme pressure in the head  unusually weak or tired Side effects that usually do not require medical attention (report to your doctor or health care professional if they continue or are bothersome):  flushing of the face or neck  headache  irregular heartbeat, palpitations  nausea, vomiting This list may not describe all possible side effects. Call your doctor for medical advice about side effects. You may report side effects to FDA at 1-800-FDA-1088. Where should I keep my medicine? Keep out of the reach of children. Store at room temperature between 20 and 25 degrees C (68 and 77 degrees F). Store in [employer]. Protect from light and moisture. Keep tightly closed. Throw away any unused medicine after the expiration date. NOTE: This sheet is a summary. It may not cover all possible information. If you have questions about this medicine, talk to your doctor, pharmacist, or health care provider.  2020 Elsevier/Gold Standard (2012-12-26 17:57:36)   *If  you need a refill on your cardiac medications before your next appointment, please call your pharmacy*   Lab Work:   cbc bmp see instruction letter   If you have labs (blood work) drawn today and your tests are completely normal, you will receive your results only by: Marland Kitchen MyChart Message (if you have MyChart) OR . A paper copy in the mail If you have any lab test that is abnormal or we need to change your treatment, we will call you to review the results.   Testing/Procedures: Will be schedule at [facility] - cardiac cath Lab  [place] has requested that you have a cardiac catheterization. Cardiac catheterization is used to diagnose and/or treat various heart conditions. Doctors may recommend this procedure for a number of different reasons. The most common reason is to evaluate chest pain. Chest pain can be a symptom of coronary artery disease (CAD), and cardiac catheterization can show whether plaque is narrowing or blocking your heart's arteries. This procedure is also used to evaluate the valves, as well as measure the blood flow and oxygen levels in different parts of your heart. For further information please visit [email or web address]. Please follow instruction sheet, as given.    Follow-Up: At [facility],  you and your health needs are our priority.  As part of our continuing mission to provide you with exceptional heart care, we have created designated Provider Care Teams.  These Care Teams include your primary Cardiologist (physician) and Advanced Practice Providers (APPs -  Physician Assistants and Nurse Practitioners) who all work together to provide you with the care you need, when you need it.     Your next appointment:     Mar 24, 2020 at 8:40 am    The format for your next appointment:   In Person  Provider:   Glenetta Hew, MD    Other Instructions       [address] [place] [address] Dept: [phone] Loc: [place]  01/23/2020  You are scheduled for a Cardiac Catheterization on Monday, December 20 with Dr. Glenetta Hew.  1. Please arrive at the [facility] (Main Entrance [facility] [address] [place], [place] [address] at 5:30 AM (This time is two hours before your procedure to ensure your preparation). Free valet parking service is available.   Special note: Every effort is made to have your procedure done on time. Please understand that emergencies sometimes delay scheduled procedures.  2. Diet: Do not eat solid foods after midnight.  The patient may have clear liquids until 5am upon the day of the procedure.  3. Labs: You will need to have blood ( CBC or BMP)  drawn in Dec but NO LATER THAN  Wednesday or Thursday, December 15 or 16   at [place], [place]  Open: 8am - 5pm (Lunch 12:30 - 1:30)   Phone: [phone]. You do not need to be fasting.   You will need a COVID-19  test prior to your procedure. You are scheduled Feb 26, 2020 at  2:45 PM. This is a Drive Up Visit at [address] [place], [place] [address]. Someone will direct you to the appropriate testing line. Stay in your car and someone will be with you shortly.  4. Medication instructions in preparation for your procedure:   Contrast Allergy: No  Stop taking, Lasix (Furosemide) and ,  Monday, December 20, ( only day of procedure )  TAKE LOSARTAN AT [facility] ON DEC 19 INSTEAD OF BEDTIME THAT DAY   On the morning of your procedure, take your Aspirin 81  and any morning medicines NOT listed above.  You may use sips of water.  5. Plan for one night stay--bring personal belongings. 6. Bring a current list of your medications and current insurance cards. 7. You MUST have a responsible person to drive you home. 8. Someone MUST be with you the first 24 hours  after you arrive home or your discharge will be delayed. 9. Please wear clothes that are easy to get on and off and wear slip-on shoes.  Thank you for allowing [place] to care for you!   -- [facility]

## 2020-01-23 NOTE — Progress Notes (Signed)
Primary Care Provider: Issac Small, MD Cardiologist: Glenetta Hew, MD; formerly patient of Dr. Wynonia Lawman since September 2011 Electrophysiologist: None  Clinic Note: Chief Complaint  Patient presents with  . Follow-up    Myoview and event monitor results.  . Chest Pain    Still having chest pain with exertion.  . Palpitations    Still present   HPI:    George Erickson is a 49 y.o. male with a PMH notable for history of MI at age 11 (PCI to Diag) risk factors of hypertension, hyperlipidemia and obesity and OSA on CPAP) who is being seen today for follow-up evaluation of CHEST PAIN & PALPITATIONS -> Myoview and Event Monitor results  He is a former patient of Dr. Wynonia Lawman, Morris was lost to follow-up after Dr. Wynonia Lawman retired.  He was recently seen on December 01, 2019 2 [facility] at the request of Moiz Small, MD CHEST PAIN and PALPITATIONS.  Recent Hospitalizations: None  Reviewed  CV studies:    The following studies were reviewed today: (if available, images/films reviewed: From Epic Chart or Care Everywhere)  [place] 12/05/2019: EF> 65%.  Fixed anterior perfusion defect with normal motion suggesting artifact.  Read as LOW RISK; NOT NORMAL  Due to Zio patch monitor: Essentially normal sinus rhythm with rare PACs and PVCs.  Symptoms noted PVCs.  No arrhythmias either tachycardic or bradycardic.   Interval History:   George Erickson presents here today to follow-up after his Myoview stress test.  He still has pretty significant exertional dyspnea and chest discomfort.  Yesterday walking to the grocery store he had another episode of pretty significant chest discomfort with dyspnea causing him to stop.  He has been trying to use his inhaler to help with her dyspnea but notes that that really has not helped at all.  At this point, he is concerned that he could have worsening coronary disease despite having a nonischemic Myoview.  The chest discomfort  episodes are happening more frequently and with more intensity, with rest exertion.  They also seem to be lasting longer according to his estimation.  Sometimes associated with this discomfort he feels a bit lightheaded and dizzy as well as short of breath, but mostly he has chest discomfort with dyspnea.  He notes that it takes little while for his heart rate to come down after any particular exertion.  He still feels intermittent palpitations, they seem to be way out of proportion than what was noted on monitor.  He essentially felt single PVCs.  Does not have any symptoms at rest.  No resting dyspnea or chest discomfort.  CV Review of Symptoms (Summary): positive for - chest pain, dyspnea on exertion, irregular heartbeat, palpitations, rapid heart rate and Prolonged fast heart rate after exertion negative for - edema, orthopnea, paroxysmal nocturnal dyspnea, shortness of breath or Although he gets lightheaded and dizzy on occasion, no syncope or near syncope.  No TIA or amaurosis fugax.  No claudication.  The patient DOES NOT have symptoms concerning for COVID-19 infection (fever, chills, cough, or new shortness of breath).   REVIEWED OF SYSTEMS   Review of Systems  Constitutional: Negative for malaise/fatigue and weight loss.  HENT: Negative for congestion and nosebleeds.   Respiratory: Negative for cough, shortness of breath (Only with exertion) and wheezing.   Cardiovascular: Negative for leg swelling.  Gastrointestinal: Negative for blood in stool and melena.  Genitourinary: Negative for hematuria.  Musculoskeletal: Negative for joint pain and myalgias.  Neurological:  Positive for dizziness (With palpitations). Negative for focal weakness.  Psychiatric/Behavioral: The patient is nervous/anxious (He is getting very asked about his chest discomfort.).    I have reviewed and (if needed) personally updated the patient's problem list, medications, allergies, past medical and surgical  history, social and family history.   PAST MEDICAL HISTORY   Past Medical History:  Diagnosis Date  . Allergic rhinitis due to pollen   . Arthritis    lower back  . CAD S/P DES PCI D1 11/15/2009   NSTEMI - 100% D1 (DES PCI - Promus DES 2.25 x 23 -- 2.3 mm); otherwise minimal CAD in a co-dominant system  . Chronic left shoulder pain    Impingement syndrome  . Erectile dysfunction   . Gout   . Hyperlipidemia with target LDL less than 70    Associated CAD  . Hypertension   . Hypertensive kidney disease with CKD stage IV ([place])    Cr as of August 2021 1.92  . Non-ST elevation (NSTEMI) myocardial infarction ([place]) 11/14/2009   Cath - 100% D1 --> DES PCI Promus 2.25 x 23 (2.3 mm).  EF 55% with anterolateral HK. -> patent stent in Sept 2012  . Obesity (BMI 30.0-34.9)   . OSA on CPAP    uses cpap  . Vertigo     PAST SURGICAL HISTORY   Past Surgical History:  Procedure Laterality Date  . CORONARY STENT PLACEMENT  11/15/2009   (Dr. Burt Knack) -- DES PCI of 100% D1 - Promus DES 2.25 x 23 --> 2.3 mm  . HERNIA REPAIR     hernia repair at birth  . LEFT HEART CATH AND CORONARY ANGIOGRAPHY  11/15/2009   (Dr. Burt Knack): NSTEMI -- LM-< LAD & LCx. ~mLAD ~30-40% just past D1 (D1 - 100% - CULPRIT - PCI), LCx normal - 2 OM & LPL. Dom RCA with Large PDA (small PL), ~30% mRCA.  EF ~55% with Ant-Lat HK   . LEFT HEART CATH AND CORONARY ANGIOGRAPHY  11/22/2010   (Dr. Wynonia Lawman): Patent Diag 1 stent with minimal CAD elsewhere.  EF ~55-60% with Anterolateral HK  . LEFT HEART CATH AND CORONARY ANGIOGRAPHY  12/31/2013   [facility] - for Abnormal Myoview) --> Non-obstructive CAD with widely patent D1 stent.  LVEDP ~16 mmHg  . NASAL SINUS SURGERY  2010   For sleep apnea  . NM MYOVIEW LTD  12/05/2019   EF> 65%.  Fixed anterior perfusion defect with normal motion suggesting artifact.  Read as LOW RISK -> but NOT NORMAL  . TM MYOVIEW  12/2013   (FALSE POSITIVE) + TM - lateral ST depressions (also noted with recovery -  near syncope) -> Reversible Anterior Wall Perfusion defect - CRO Ischemia; Partialy reversible Inferior defect - ~ diaphragmatic attenuation. EF ~48%. --> Referred for CATH --> FALSE POSTIIVE  . TRANSTHORACIC ECHOCARDIOGRAM  07/08/2014   EF 60%. No RWMA. Mild LA dilation. Normal walves.    There is no immunization history on file for this patient.  Covid Vaccine Levan Hurst) - 3/12; 4/9 - [facility] [place], [place]  MEDICATIONS/ALLERGIES   Current Meds  Medication Sig  . allopurinol (ZYLOPRIM) 100 MG tablet Take 200 mg by mouth daily.  Marland Kitchen amLODipine (NORVASC) 10 MG tablet Take 10 mg by mouth daily.  Marland Kitchen aspirin EC 81 MG tablet Take 81 mg by mouth daily.  . Azelastine HCl 137 MCG/SPRAY SOLN Place 2 sprays into both nostrils in the morning and at bedtime.  . cholecalciferol (VITAMIN D3) 25 MCG (  1000 UNIT) tablet Take 2,000 Units by mouth daily.  . famotidine (PEPCID) 20 MG tablet Take 20 mg by mouth at bedtime as needed.  . fluticasone (FLONASE) 50 MCG/ACT nasal spray Place 2 sprays into the nose daily.  . furosemide (LASIX) 40 MG tablet Take 40 mg by mouth daily.  Marland Kitchen losartan (COZAAR) 50 MG tablet Take 50 mg by mouth daily.  . metoprolol succinate (TOPROL-XL) 25 MG 24 hr tablet Take 25 mg by mouth daily.  . montelukast (SINGULAIR) 10 MG tablet Take 10 mg by mouth daily.  . pravastatin (PRAVACHOL) 40 MG tablet Take 1 tablet (40 mg total) by mouth every evening.  Marland Kitchen PROAIR RESPICLICK 660 (90 Base) MCG/ACT AEPB Inhale into the lungs.  . [DISCONTINUED] hydrALAZINE (APRESOLINE) 50 MG tablet Take 50 mg by mouth 2 (two) times daily.  . [DISCONTINUED] metoprolol tartrate (LOPRESSOR) 25 MG tablet Take 25 mg by mouth daily.    Allergies  Allergen Reactions  . Niacin And Related Itching  . Zocor [Simvastatin-High Dose]     Muscle aches    SOCIAL HISTORY/FAMILY HISTORY   Reviewed in Epic:  Pertinent findings:  Social History   Tobacco Use  . Smoking status: Never Smoker  .  Smokeless tobacco: Never Used  Substance Use Topics  . Alcohol use: Yes    Alcohol/week: 1.0 standard drink    Types: 1 Standard drinks or equivalent per week    Comment: Rare, Occasional glass of wine  . Drug use: No   Social History   Social History Narrative   He is a married father of 2 girls.   Public relations account executive   Lifelong non-smoker.   Rare glass of wine   Minimal caffeine intake, no longer drinks coffee only drinks intermittent sweet tea.  Rarely would have a Sprite.      Walks daily at work, does not get routine exercise.   Family History  Problem Relation Age of Onset  . Hypertension Mother   . Coronary artery disease Mother 53       s/p CABG  . Diabetes Mellitus II Mother   . Hyperlipidemia Mother   . Hypertension Father   . Heart attack Father 68       Had several after  . Diabetes Mellitus II Father   . Heart failure Father   . CVA Father 70  . Hyperlipidemia Father   . CVA Sister   . Hypothyroidism Sister   . Hypertension Sister   . Healthy Brother   . Healthy Brother   . Hypertension Sister   . Heart attack Sister      OBJCTIVE -PE, EKG, labs   Wt Readings from Last 3 Encounters:  01/23/20 254 lb 3.2 oz (115.3 kg)  12/05/19 258 lb (117 kg)  12/01/19 258 lb 4.8 oz (117.2 kg)    Physical Exam: BP (!) 150/84   Pulse 60   Ht 5\' 11"  (1.803 m)   Wt 254 lb 3.2 oz (115.3 kg)   BMI 35.45 kg/m  Physical Exam Vitals reviewed.  Constitutional:      General: He is not in acute distress.    Appearance: Normal appearance. He is obese. He is not ill-appearing.     Comments: Well-groomed.  Healthy-appearing.  He seems very anxious and worried.  HENT:     Head: Normocephalic and atraumatic.  Eyes:     Extraocular Movements: Extraocular movements intact.     Pupils: Pupils are equal, round, and reactive to light.  Neck:  Vascular: No carotid bruit, hepatojugular reflux or JVD.  Cardiovascular:     Rate and Rhythm:  Normal rate and regular rhythm.  No extrasystoles are present.    Chest Wall: PMI is not displaced (Difficult to palpate due to obesity).     Pulses: Normal pulses.     Heart sounds: S1 normal and S2 normal. Heart sounds are distant. No murmur heard.  No friction rub. No gallop.   Pulmonary:     Effort: Pulmonary effort is normal. No respiratory distress.     Breath sounds: Normal breath sounds.  Chest:     Chest wall: No tenderness.  Abdominal:     General: Abdomen is flat. Bowel sounds are normal. There is no distension.     Palpations: Abdomen is soft. There is no mass.     Tenderness: There is no abdominal tenderness.     Comments: Obese.  No HSM  Musculoskeletal:        General: Swelling (Trivial) present. Normal range of motion.     Cervical back: Normal range of motion.  Neurological:     General: No focal deficit present.     Mental Status: He is alert and oriented to person, place, and time. Mental status is at baseline.     Cranial Nerves: No cranial nerve deficit.  Psychiatric:        Mood and Affect: Mood normal.        Thought Content: Thought content normal.        Judgment: Judgment normal.     Adult ECG Report n/a  Recent Labs:   10/13/2019 Na+ 141, K+ 3.9, Cl- 107, HCO3-28, BUN 18, Cr 1.92, Glu 127, Ca2+ 9.1; AST 17, ALT 15, AlkP 67 CBC: W 10.2, H/H 12.4/34.7, Plt 324; TSH 1.46; (A1c 4.7 September 27, 2018) TC 167, TG 115, HDL 41, LDL 105  Lab Results  Component Value Date   TSH 0.259 (L) 07/07/2014    ASSESSMENT/PLAN    Problem List Items Addressed This Visit    CAD S/P percutaneous coronary angioplasty (Chronic)    Distant history of diagonal PCI with several episodes of recurrent chest pain, patent stents by cath.  He now presents again after several years of doing well with new onset of exertional chest discomfort. We did check of Myoview which was read as low risk, but there indeed was an anterior defect, and we cannot exclude rest ischemia.  He did have  an diagonal PCI, so he cannot exclude LAD disease.  At this point, with ongoing chest discomfort despite "low risk" Myoview, I think we are forced to proceed with diagnostic cardiac catheterization and possible PCI.  Plan:  Schedule LEFT HEART CATHETERIZATION and POSSIBLE PCI (tentatively December 20)  Ramp up blood pressure control by titrating up hydralazine to 100 mg twice daily, along with amlodipine 10 mg, losartan 50 mg and Toprol 25 mg.  As anti-anginal as he is on amlodipine 10 mg, metoprolol succinate 25 mg.  Refill as needed NTG, start Imdur 30 mg.      Relevant Medications   metoprolol succinate (TOPROL-XL) 25 MG 24 hr tablet   hydrALAZINE (APRESOLINE) 100 MG tablet   isosorbide mononitrate (IMDUR) 30 MG 24 hr tablet   nitroGLYCERIN (NITROSTAT) 0.4 MG SL tablet   Other Relevant Orders   Basic metabolic panel   CBC   Hyperlipidemia with target LDL less than 70 (Chronic)    LDL as of August second was 105.  This is on pravastatin.  Anticipate  conversion to rosuvastatin 40 mg at the time of cardiac catheterization.      Relevant Medications   metoprolol succinate (TOPROL-XL) 25 MG 24 hr tablet   hydrALAZINE (APRESOLINE) 100 MG tablet   isosorbide mononitrate (IMDUR) 30 MG 24 hr tablet   nitroGLYCERIN (NITROSTAT) 0.4 MG SL tablet   Essential hypertension (Chronic)    Hypertensive today at 150/84.  Nephrology to start hydralazine which I increased to 50 mg twice daily last visit.  Plan: Increase hydralazine to 1 mg twice daily along with amlodipine 10 mg, losartan 5 mg and Toprol 25 mg.  With his borderline renal function -> creatinine 1.92 in August, would be reluctant to push ARB dose any further.  Adding Imdur 30 mg daily as well.      Relevant Medications   metoprolol succinate (TOPROL-XL) 25 MG 24 hr tablet   hydrALAZINE (APRESOLINE) 100 MG tablet   isosorbide mononitrate (IMDUR) 30 MG 24 hr tablet   nitroGLYCERIN (NITROSTAT) 0.4 MG SL tablet    Hyperglycemia (Chronic)   Progressive angina (HCC) - Primary (Chronic)    He continued chest discomfort with exertion that seems to be worse.  Myoview read as low risk, but there is indeed an anterior defect, so cannot exclude ischemia.  Plan:  Refill as needed NTG, add Imdur 30 mg  Increase hydralazine 100 mg twice daily  LEFT HEART CATH WITH CORONARY ANGIOGRAPHY AND POSSIBLE PCI   Performing MD:  Glenetta Hew, M.D., M.S.  The procedure with Risks/Benefits/Alternatives and Indications was reviewed with the patient.  All questions were answered.    Risks / Complications include, but not limited to: Death, MI, CVA/TIA, VF/VT (with defibrillation), Bradycardia (need for temporary pacer placement), contrast induced nephropathy, bleeding / bruising / hematoma / pseudoaneurysm, vascular or coronary injury (with possible emergent CT or Vascular Surgery), adverse medication reactions, infection.  Additional risks involving the use of radiation with the possibility of radiation burns and cancer were explained in detail.  The patient voices understanding and agree to proceed.        Relevant Medications   metoprolol succinate (TOPROL-XL) 25 MG 24 hr tablet   hydrALAZINE (APRESOLINE) 100 MG tablet   isosorbide mononitrate (IMDUR) 30 MG 24 hr tablet   nitroGLYCERIN (NITROSTAT) 0.4 MG SL tablet   Other Relevant Orders   Basic metabolic panel   CBC   Abnormal nuclear stress test (Chronic)    On my personal review of is my, there is indeed an anterior defect.  It is read as low risk because of wall motion there.  Cannot exclude anterior ischemia.  It is unusual for a man to have artifact in the anterior wall.  With ongoing pain, I am concerned that we may be missing something. Plan: Left heart catheterization with possible PCI.      Relevant Orders   Basic metabolic panel   CKD (chronic kidney disease), stage III ([place])    His creatinine has been relatively well controlled until August when  it was up to 1.9.  Now being followed by nephrology. Since increasing his hydralazine from additional blood pressure control.  With planned cardiac catheterization, we will schedule him for 2nd Case -> this will allow for him to come into the regular hour for 1st case and receive IV fluid hydration.  I did explain to him that we may need to staged PCI if extensive PCI required.      Palpitations - with exertion   Relevant Orders   Basic metabolic panel  CBC     Cath Lab Visit (complete for each Cath Lab visit)  Clinical Evaluation Leading to the Procedure:   ACS: No.  Non-ACS:    Anginal Classification: CCS III  Anti-ischemic medical therapy: Maximal Therapy (2 or more classes of medications)  Non-Invasive Test Results: Low-risk stress test findings: cardiac mortality <1%/year; with equivocal results - ongoing progressive angina  Prior CABG: No previous CABG  ---------------------------------------------------------------  COVID-19 Education: The signs and symptoms of COVID-19 were discussed with the patient and how to seek care for testing (follow up with PCP or arrange E-visit).   The importance of social distancing and COVID-19 vaccination was discussed today. The patient is practicing social distancing & Masking.   I spent a total of 32 minutes with the patient spent in direct patient consultation.  Additional time spent with chart review  / charting (studies, outside notes, etc): 16 Total Time: 80min   Current medicines are reviewed at length with the patient today.  (+/- concerns) n/a  Notice: This dictation was prepared with Dragon dictation along with smaller phrase technology. Any transcriptional errors that result from this process are unintentional and may not be corrected upon review.  Patient Instructions / Medication Changes & Studies & Tests Ordered   Patient Instructions  Medication Instructions:    Increase  hydralazine to 100 mg twice a day   Start  taking Imdur ( isosorbide mono. ) 30 mg  Take at bedtime one tablet  May use Nitroglycerin 0.4 mg as need for chest discomfort  *If you need a refill on your cardiac medications before your next appointment, please call your pharmacy*   Lab Work:   cbc bmp see instruction letter   If you have labs (blood work) drawn today and your tests are completely normal, you will receive your results only by: Marland Kitchen MyChart Message (if you have MyChart) OR . A paper copy in the mail If you have any lab test that is abnormal or we need to change your treatment, we will call you to review the results.   Testing/Procedures: Will be schedule at [facility] - cardiac cath Lab  [place] has requested that you have a cardiac catheterization. Cardiac catheterization is used to diagnose and/or treat various heart conditions. Doctors may recommend this procedure for a number of different reasons. The most common reason is to evaluate chest pain. Chest pain can be a symptom of coronary artery disease (CAD), and cardiac catheterization can show whether plaque is narrowing or blocking your heart's arteries. This procedure is also used to evaluate the valves, as well as measure the blood flow and oxygen levels in different parts of your heart. For further information please visit [email or web address][facility][email or web address]. Please follow instruction sheet, as given.    Follow-Up: At [facility], you and your health needs are our priority.  As part of our continuing mission to provide you with exceptional heart care, we have created designated Provider Care Teams.  These Care Teams include your primary Cardiologist (physician) and Advanced Practice Providers (APPs -  Physician Assistants and Nurse Practitioners) who all work together to provide you with the care you need, when you need it.     Your next appointment:     Mar 24, 2020 at 8:40 am    The format for your next appointment:   In  Person  Provider:   Glenetta Hew, MD    Other Instructions  Instructions for cardiac cath provided: March 02, 2019  Studies Ordered:   Orders Placed This Encounter  Procedures  . Basic metabolic panel  . CBC     Glenetta Hew, M.D., M.S. Interventional Cardiologist   Pager # [phone] Phone # [phone] [address] [place], [place] [address]   Thank you for choosing Heartcare at [facility]!!

## 2020-01-25 ENCOUNTER — Encounter: Payer: Self-pay | Admitting: Cardiology

## 2020-01-25 DIAGNOSIS — N183 Chronic kidney disease, stage 3 unspecified: Secondary | ICD-10-CM | POA: Insufficient documentation

## 2020-01-25 DIAGNOSIS — N184 Chronic kidney disease, stage 4 (severe): Secondary | ICD-10-CM | POA: Insufficient documentation

## 2020-01-25 NOTE — Assessment & Plan Note (Signed)
He continued chest discomfort with exertion that seems to be worse.  Myoview read as low risk, but there is indeed an anterior defect, so cannot exclude ischemia.  Plan:  Refill as needed NTG, add Imdur 30 mg  Increase hydralazine 100 mg twice daily  LEFT HEART CATH WITH CORONARY ANGIOGRAPHY AND POSSIBLE PCI   Performing MD:  Glenetta Hew, M.D., M.S.  The procedure with Risks/Benefits/Alternatives and Indications was reviewed with the patient.  All questions were answered.    Risks / Complications include, but not limited to: Death, MI, CVA/TIA, VF/VT (with defibrillation), Bradycardia (need for temporary pacer placement), contrast induced nephropathy, bleeding / bruising / hematoma / pseudoaneurysm, vascular or coronary injury (with possible emergent CT or Vascular Surgery), adverse medication reactions, infection.  Additional risks involving the use of radiation with the possibility of radiation burns and cancer were explained in detail.  The patient voices understanding and agree to proceed.

## 2020-01-25 NOTE — Assessment & Plan Note (Signed)
On my personal review of is my, there is indeed an anterior defect.  It is read as low risk because of wall motion there.  Cannot exclude anterior ischemia.  It is unusual for a man to have artifact in the anterior wall.  With ongoing pain, I am concerned that we may be missing something. Plan: Left heart catheterization with possible PCI.

## 2020-01-25 NOTE — Assessment & Plan Note (Signed)
His creatinine has been relatively well controlled until August when it was up to 1.9.  Now being followed by nephrology. Since increasing his hydralazine from additional blood pressure control.  With planned cardiac catheterization, we will schedule him for 2nd Case -> this will allow for him to come into the regular hour for 1st case and receive IV fluid hydration.  I did explain to him that we may need to staged PCI if extensive PCI required.

## 2020-01-25 NOTE — Assessment & Plan Note (Signed)
Hypertensive today at 150/84.  Nephrology to start hydralazine which I increased to 50 mg twice daily last visit.  Plan: Increase hydralazine to 1 mg twice daily along with amlodipine 10 mg, losartan 5 mg and Toprol 25 mg.  With his borderline renal function -> creatinine 1.92 in August, would be reluctant to push ARB dose any further.  Adding Imdur 30 mg daily as well.

## 2020-01-25 NOTE — Assessment & Plan Note (Signed)
LDL as of August second was 105.  This is on pravastatin.  Anticipate conversion to rosuvastatin 40 mg at the time of cardiac catheterization.

## 2020-01-25 NOTE — Assessment & Plan Note (Signed)
Distant history of diagonal PCI with several episodes of recurrent chest pain, patent stents by cath.  He now presents again after several years of doing well with new onset of exertional chest discomfort. We did check of Myoview which was read as low risk, but there indeed was an anterior defect, and we cannot exclude rest ischemia.  He did have an diagonal PCI, so he cannot exclude LAD disease.  At this point, with ongoing chest discomfort despite "low risk" Myoview, I think we are forced to proceed with diagnostic cardiac catheterization and possible PCI.  Plan:  Schedule LEFT HEART CATHETERIZATION and POSSIBLE PCI (tentatively December 20)  Ramp up blood pressure control by titrating up hydralazine to 100 mg twice daily, along with amlodipine 10 mg, losartan 50 mg and Toprol 25 mg.  As anti-anginal as he is on amlodipine 10 mg, metoprolol succinate 25 mg.  Refill as needed NTG, start Imdur 30 mg.

## 2020-02-04 DIAGNOSIS — G4733 Obstructive sleep apnea (adult) (pediatric): Secondary | ICD-10-CM | POA: Diagnosis not present

## 2020-02-04 DIAGNOSIS — R0789 Other chest pain: Secondary | ICD-10-CM | POA: Diagnosis not present

## 2020-02-04 DIAGNOSIS — E785 Hyperlipidemia, unspecified: Secondary | ICD-10-CM | POA: Diagnosis not present

## 2020-02-04 DIAGNOSIS — R202 Paresthesia of skin: Secondary | ICD-10-CM | POA: Diagnosis not present

## 2020-02-04 DIAGNOSIS — Z7982 Long term (current) use of aspirin: Secondary | ICD-10-CM | POA: Diagnosis not present

## 2020-02-04 DIAGNOSIS — Z888 Allergy status to other drugs, medicaments and biological substances status: Secondary | ICD-10-CM | POA: Diagnosis not present

## 2020-02-04 DIAGNOSIS — Z955 Presence of coronary angioplasty implant and graft: Secondary | ICD-10-CM | POA: Diagnosis not present

## 2020-02-04 DIAGNOSIS — N183 Chronic kidney disease, stage 3 unspecified: Secondary | ICD-10-CM | POA: Diagnosis not present

## 2020-02-04 DIAGNOSIS — Z79899 Other long term (current) drug therapy: Secondary | ICD-10-CM | POA: Diagnosis not present

## 2020-02-04 DIAGNOSIS — I129 Hypertensive chronic kidney disease with stage 1 through stage 4 chronic kidney disease, or unspecified chronic kidney disease: Secondary | ICD-10-CM | POA: Diagnosis not present

## 2020-02-04 DIAGNOSIS — I2510 Atherosclerotic heart disease of native coronary artery without angina pectoris: Secondary | ICD-10-CM | POA: Diagnosis not present

## 2020-02-04 DIAGNOSIS — I10 Essential (primary) hypertension: Secondary | ICD-10-CM | POA: Diagnosis not present

## 2020-02-04 DIAGNOSIS — Z6835 Body mass index (BMI) 35.0-35.9, adult: Secondary | ICD-10-CM | POA: Diagnosis not present

## 2020-02-04 DIAGNOSIS — I214 Non-ST elevation (NSTEMI) myocardial infarction: Secondary | ICD-10-CM | POA: Diagnosis not present

## 2020-02-04 DIAGNOSIS — T82855A Stenosis of coronary artery stent, initial encounter: Secondary | ICD-10-CM | POA: Diagnosis not present

## 2020-02-04 DIAGNOSIS — R9439 Abnormal result of other cardiovascular function study: Secondary | ICD-10-CM | POA: Diagnosis not present

## 2020-02-04 DIAGNOSIS — I252 Old myocardial infarction: Secondary | ICD-10-CM | POA: Diagnosis not present

## 2020-02-04 DIAGNOSIS — E669 Obesity, unspecified: Secondary | ICD-10-CM | POA: Diagnosis not present

## 2020-02-04 DIAGNOSIS — N179 Acute kidney failure, unspecified: Secondary | ICD-10-CM | POA: Diagnosis not present

## 2020-02-04 DIAGNOSIS — Z20822 Contact with and (suspected) exposure to covid-19: Secondary | ICD-10-CM | POA: Diagnosis not present

## 2020-02-04 DIAGNOSIS — R079 Chest pain, unspecified: Secondary | ICD-10-CM | POA: Diagnosis not present

## 2020-02-04 DIAGNOSIS — N189 Chronic kidney disease, unspecified: Secondary | ICD-10-CM | POA: Diagnosis not present

## 2020-02-09 ENCOUNTER — Telehealth: Payer: Self-pay | Admitting: Cardiology

## 2020-02-09 NOTE — Telephone Encounter (Signed)
° ° °  Pt would like to cancel heart cath, he said he was hospitalized on 11/24 and had his heart cath on 11/26 and was discharged 11/27.

## 2020-02-09 NOTE — Telephone Encounter (Signed)
Spoke to patient, states he had cardiac cath and stent placed at [facility] 11/26.  States he was scheduled for cath 12/20 with Dr. Ellyn Hack but this is no longer needed.       Per care everywhere: NSTEMI 11/24 admitted, cath + PCI cfx 11/26.    Advised to follow up with cardiologist.     Cath and covid screening cancelled.   Scheduled follow up with Dr. Ellyn Hack 12/14, patient verbalized understanding.      Routed to MD and nurse to make aware.

## 2020-02-11 NOTE — Telephone Encounter (Signed)
I was aware of this.  We were hoping to get his cath done, but I guess he been into it.  It would be nice to get the cath report from [place] [facility].  I can get everything else, but I cannot see the cath or echo report.  If we can please send off to get these 2 studies before see him back that would help.   Glenetta Hew, MD

## 2020-02-16 DIAGNOSIS — R5382 Chronic fatigue, unspecified: Secondary | ICD-10-CM | POA: Diagnosis not present

## 2020-02-16 DIAGNOSIS — R739 Hyperglycemia, unspecified: Secondary | ICD-10-CM | POA: Diagnosis not present

## 2020-02-16 DIAGNOSIS — R0602 Shortness of breath: Secondary | ICD-10-CM | POA: Diagnosis not present

## 2020-02-16 DIAGNOSIS — Z5181 Encounter for therapeutic drug level monitoring: Secondary | ICD-10-CM | POA: Diagnosis not present

## 2020-02-19 ENCOUNTER — Other Ambulatory Visit (HOSPITAL_COMMUNITY): Payer: Self-pay | Admitting: Family Medicine

## 2020-02-19 ENCOUNTER — Ambulatory Visit (HOSPITAL_COMMUNITY)
Admission: RE | Admit: 2020-02-19 | Discharge: 2020-02-19 | Disposition: A | Discharge status: Home / Self Care | Payer: BC Managed Care – PPO | Source: Ambulatory Visit | Attending: Vascular Surgery | Admitting: Vascular Surgery

## 2020-02-19 ENCOUNTER — Other Ambulatory Visit: Payer: Self-pay

## 2020-02-19 DIAGNOSIS — R0609 Other forms of dyspnea: Secondary | ICD-10-CM

## 2020-02-19 DIAGNOSIS — R0600 Dyspnea, unspecified: Secondary | ICD-10-CM | POA: Diagnosis not present

## 2020-02-20 ENCOUNTER — Telehealth: Payer: Self-pay | Admitting: *Deleted

## 2020-02-20 ENCOUNTER — Other Ambulatory Visit (HOSPITAL_COMMUNITY): Payer: Self-pay | Admitting: Family Medicine

## 2020-02-20 ENCOUNTER — Other Ambulatory Visit: Payer: Self-pay | Admitting: Family Medicine

## 2020-02-20 DIAGNOSIS — R5382 Chronic fatigue, unspecified: Secondary | ICD-10-CM

## 2020-02-20 DIAGNOSIS — R0602 Shortness of breath: Secondary | ICD-10-CM

## 2020-02-20 DIAGNOSIS — R7989 Other specified abnormal findings of blood chemistry: Secondary | ICD-10-CM

## 2020-02-20 NOTE — Telephone Encounter (Signed)
Called  [facility] dept [phone] - to request fax number - to receive report form 02/06/20 cath and echo report  02/05/20   representative  Stated to fax [phone]  on cover sheet request information . They will fax information back   Done and completed.

## 2020-02-24 ENCOUNTER — Other Ambulatory Visit: Payer: Self-pay

## 2020-02-24 ENCOUNTER — Encounter: Payer: Self-pay | Admitting: Cardiology

## 2020-02-24 ENCOUNTER — Ambulatory Visit (INDEPENDENT_AMBULATORY_CARE_PROVIDER_SITE_OTHER): Payer: BC Managed Care – PPO | Admitting: Cardiology

## 2020-02-24 VITALS — BP 150/90 | HR 62 | Ht 71.0 in | Wt 251.0 lb

## 2020-02-24 DIAGNOSIS — N1832 Chronic kidney disease, stage 3b: Secondary | ICD-10-CM

## 2020-02-24 DIAGNOSIS — I25119 Atherosclerotic heart disease of native coronary artery with unspecified angina pectoris: Secondary | ICD-10-CM

## 2020-02-24 DIAGNOSIS — I2510 Atherosclerotic heart disease of native coronary artery without angina pectoris: Secondary | ICD-10-CM

## 2020-02-24 DIAGNOSIS — Z9861 Coronary angioplasty status: Secondary | ICD-10-CM

## 2020-02-24 DIAGNOSIS — I214 Non-ST elevation (NSTEMI) myocardial infarction: Secondary | ICD-10-CM

## 2020-02-24 DIAGNOSIS — I10 Essential (primary) hypertension: Secondary | ICD-10-CM

## 2020-02-24 DIAGNOSIS — E785 Hyperlipidemia, unspecified: Secondary | ICD-10-CM | POA: Diagnosis not present

## 2020-02-24 MED ORDER — TICAGRELOR 90 MG PO TABS: 90.0000 mg | ORAL_TABLET | Freq: Two times a day (BID) | ORAL | 3 refills | Status: DC

## 2020-02-24 MED ORDER — HYDRALAZINE HCL 100 MG PO TABS
100.0000 mg | ORAL_TABLET | Freq: Two times a day (BID) | ORAL | 3 refills | Status: DC
Start: 2020-02-24 — End: 2021-03-15

## 2020-02-24 NOTE — Telephone Encounter (Signed)
Reports obtain and available at patient office appointment today 02/23/20

## 2020-02-24 NOTE — Progress Notes (Signed)
Primary Care Provider: Keanon Small, MD Cardiologist: Glenetta Hew, MD; formerly patient of Dr. Wynonia Lawman since September 2011 Electrophysiologist: None  Clinic Note: Chief Complaint  Patient presents with  . Hospitalization Follow-up    Recent non-STEMI-DES PCI-OM 3  . Coronary Artery Disease    DES PCI to OM 3, moderate in-stent restenosis of diagonal branch as well as moderate disease in RCA. Treated medically.  . Hyperlipidemia    Lipids much better  . Leg Swelling    PCP is checking lower extremity venous Dopplers and VQ scan   HPI:    George Erickson is a 49 y.o. male with a PMH notable for history of MI at age 69 (PCI to Diag) risk factors of hypertension, hyperlipidemia and obesity and OSA on CPAP) who is being seen today for [place] for Non-STEMI -PCI.  He is a former patient of Dr. Wynonia Lawman, Lynnette Caffey was lost to follow-up after Dr. Wynonia Lawman retired.  He was recently seen on December 01, 2019 2 [facility] at the request of Adnan Small, MD CHEST PAIN and PALPITATIONS. ->  He is evaluated with a Myoview stress test that was read as low risk, but clearly abnormal.  A follow-up appointment November 12, he noted  continued symptoms of follow-up, The Plan was for him to have a cardiac catheterization and possible PCI.   Recent Hospitalizations:   November 24-27 2021-admitted to [facility] with non-STEMI -PCI LCx-OM keep  Reviewed  CV studies:    The following studies were reviewed today: (if available, images/films reviewed: From Epic Chart or Care Everywhere)  TTE 02/05/20: EF> 55%. No R WMA. Normal valves.  CARDIAC CATH -PCI 02/06/2020: Severe stenosis of LCx OM 3-DES PCI (discharged on aspirin Brilinta)  D1-mid in-stent 70% restenosis; LCx-OM 3 proximal 80%, RCA mid 60 -65% =>  PCI OM 3-Synergy DES 2.5 mm x 20 mm (3.6 mm  Interval History:   George Erickson presents here today essentially for hospital follow-up. Basically he  was down visiting family in the [place]. The before Thanksgiving started having some chest discomfort when later on that evening she did not go away. He contacted his daughter who is a Marine scientist who recommended they call EMS. He did rule in for MI, and underwent cardiac catheterization with PCI.Marland Kitchen  Jaquail returns here today stating that he is not had any further chest pain since PCI. Apparently his PCP is checking for low extremity DVT with lower extremity venous Dopplers and then also a VQ scan for possible PE. Apparently was having some leg swelling and shortness of breath. He still has off-and-on exertional dyspnea, but no real PND orthopnea despite having unilateral edema.  Really having as far as dyspnea goes is intermittent spells of trying to catch his breath when he feels as though he cannot take a deep breath and has to stop intentionally take a deep breath. This has started since being on Brilinta.  It appears that his ARB is discontinued, not is unsure why.  Still has occasional palpitations, but nothing significant  CV Review of Symptoms (Summary): positive for - dyspnea on exertion, palpitations and No longer noting the prolonged last heart rates. negative for - chest pain, edema, orthopnea, paroxysmal nocturnal dyspnea, rapid heart rate, shortness of breath or Not really having any more lightheadedness or dizziness, no syncope or near syncope, no TIA or amaurosis fugax symptoms. No claudication.  The patient DOES NOT have symptoms concerning for COVID-19 infection (fever, chills, cough,  or new shortness of breath).   REVIEWED OF SYSTEMS   Review of Systems  Constitutional: Negative for malaise/fatigue and weight loss.  HENT: Negative for congestion and nosebleeds.   Respiratory: Negative for cough, shortness of breath (Only with exertion) and wheezing.   Cardiovascular: Positive for palpitations. Negative for leg swelling.  Gastrointestinal: Negative for blood in stool and  melena.  Genitourinary: Negative for hematuria.  Musculoskeletal: Negative for joint pain and myalgias.  Neurological: Positive for dizziness (With palpitations). Negative for focal weakness.  Psychiatric/Behavioral: The patient is nervous/anxious (Just has baseline anxiety). The patient does not have insomnia.    I have reviewed and (if needed) personally updated the patient's problem list, medications, allergies, past medical and surgical history, social and family history.   PAST MEDICAL HISTORY   Past Medical History:  Diagnosis Date  . Allergic rhinitis due to pollen   . Arthritis    lower back  . CAD S/P DES PCI D1 11/15/2009   a) NSTEMI - 100% D1 (DES PCI - Promus DES 2.25 x 23 -- 2.3 mm); otherwise minimal CAD in a co-dominant system;; b) 02/06/20: OM3 80% (DES PCI), D1 70% ISR, mRCA 60-65%.   . Chronic left shoulder pain    Impingement syndrome  . Erectile dysfunction   . Gout   . Hyperlipidemia with target LDL less than 70    Associated CAD  . Hypertension   . Hypertensive kidney disease with CKD stage IV ([place])    Cr as of August 2021 1.92  . Non-ST elevation (NSTEMI) myocardial infarction ([place]) 11/2009; 01/2020   a) 9/'11: Cath - 100% D1 --> DES PCI Promus 2.25 x 23 (2.3 mm).  EF 55% with anterolateral HK. -> patent stent in Sept 2012 & 2015; b) 02/05/2020: NSTEMI - Echo EF >55% no RWMA - 80% OM3 (DES PCI), 70% D1 ISR, 60-65% mRCA.  . Obesity (BMI 30.0-34.9)   . OSA on CPAP    uses cpap  . Vertigo     PAST SURGICAL HISTORY   Past Surgical History:  Procedure Laterality Date  . CORONARY STENT INTERVENTION  02/06/2020   [facility] [place], [place] -> Dr. Gretta Cool): NSTEMI: OM3 80% -> DES PCI: Synergy DES 2.5 x 20 - > 2.6 mm  . CORONARY STENT PLACEMENT  11/15/2009   (Dr. Burt Knack) -- DES PCI of 100% D1 - Promus DES 2.25 x 23 --> 2.3 mm  . HERNIA REPAIR     hernia repair at birth  . LEFT HEART CATH AND CORONARY ANGIOGRAPHY  11/15/2009   (Dr.  Burt Knack): NSTEMI -- LM-< LAD & LCx. ~mLAD ~30-40% just past D1 (D1 - 100% - CULPRIT - PCI), LCx normal - 2 OM & LPL. Dom RCA with Large PDA (small PL), ~30% mRCA.  EF ~55% with Ant-Lat HK   . LEFT HEART CATH AND CORONARY ANGIOGRAPHY  11/22/2010   (Dr. Wynonia Lawman): Patent Diag 1 stent with minimal CAD elsewhere.  EF ~55-60% with Anterolateral HK  . LEFT HEART CATH AND CORONARY ANGIOGRAPHY  12/31/2013   [facility] - for Abnormal Myoview) --> Non-obstructive CAD with widely patent D1 stent.  LVEDP ~16 mmHg  . LEFT HEART CATH AND CORONARY ANGIOGRAPHY  02/06/2020   [facility] [place], [place] -> Dr. Elsie Amis): NSTEMI: D1 70% mid stent ISR; OM3 80% (-> DES PCI), mRCA 60-65%.    Marland Kitchen NASAL SINUS SURGERY  2010   For sleep apnea  . NM MYOVIEW LTD  12/05/2019   EF>  65%.  Fixed anterior perfusion defect with normal motion suggesting artifact.  Read as LOW RISK -> but NOT NORMAL  . TM MYOVIEW  12/2013   (FALSE POSITIVE) + TM - lateral ST depressions (also noted with recovery - near syncope) -> Reversible Anterior Wall Perfusion defect - CRO Ischemia; Partialy reversible Inferior defect - ~ diaphragmatic attenuation. EF ~48%. --> Referred for CATH --> FALSE POSTIIVE  . TRANSTHORACIC ECHOCARDIOGRAM  07/08/2014   EF 60%. No RWMA. Mild LA dilation. Normal walves.  . TRANSTHORACIC ECHOCARDIOGRAM  02/05/2020   [facility] [place], [place] -> Dr. Elsie Amis): NSTEMI: EF> 55%. No R WMA. Normal valves.    Zio patch monitor: Essentially normal sinus rhythm with rare PACs and PVCs.  Symptoms noted PVCs.  No arrhythmias either tachycardic or bradycardic.  Immunization History  Administered Date(s) Administered  . Moderna Sars-Covid-2 Vaccination 05/23/2019, 06/20/2019      MEDICATIONS/ALLERGIES   Current Meds  Medication Sig  . allopurinol (ZYLOPRIM) 100 MG tablet Take 200 mg by mouth daily.  Marland Kitchen amLODipine (NORVASC) 10 MG tablet Take 10 mg by mouth daily.  Marland Kitchen aspirin 81 MG chewable tablet    . Azelastine HCl 137 MCG/SPRAY SOLN Place 2 sprays into both nostrils in the morning and at bedtime.  . cholecalciferol (VITAMIN D3) 25 MCG (1000 UNIT) tablet Take 2,000 Units by mouth daily.  . famotidine (PEPCID) 20 MG tablet Take 20 mg by mouth at bedtime as needed.  . fluticasone (FLONASE) 50 MCG/ACT nasal spray Place 2 sprays into the nose daily.  . isosorbide mononitrate (IMDUR) 30 MG 24 hr tablet Take 1 tablet (30 mg total) by mouth daily.  . metoprolol succinate (TOPROL-XL) 25 MG 24 hr tablet Take 1 tablet by mouth daily. Take 1/2 Tablet Daily  . montelukast (SINGULAIR) 10 MG tablet Take 10 mg by mouth daily.  . nitroGLYCERIN (NITROSTAT) 0.4 MG SL tablet Place 1 tablet (0.4 mg total) under the tongue every 5 (five) minutes as needed for chest pain.  . pravastatin (PRAVACHOL) 40 MG tablet Take 1 tablet (40 mg total) by mouth every evening.  Marland Kitchen PROAIR RESPICLICK 423 (90 Base) MCG/ACT AEPB Inhale into the lungs.  . [DISCONTINUED] hydrALAZINE (APRESOLINE) 100 MG tablet Take 1 tablet (100 mg total) by mouth 2 (two) times daily.    Allergies  Allergen Reactions  . Niacin And Related Itching  . Zocor [Simvastatin-High Dose]     Muscle aches  . Zocor [Simvastatin] Other (See Comments)    SOCIAL HISTORY/FAMILY HISTORY   Reviewed in Epic:  Pertinent findings:  Social History   Tobacco Use  . Smoking status: Never Smoker  . Smokeless tobacco: Never Used  Substance Use Topics  . Alcohol use: Yes    Alcohol/week: 1.0 standard drink    Types: 1 Standard drinks or equivalent per week    Comment: Rare, Occasional glass of wine  . Drug use: No   Social History   Social History Narrative   He is a married father of 2 girls.   Public relations account executive   Lifelong non-smoker.   Rare glass of wine   Minimal caffeine intake, no longer drinks coffee only drinks intermittent sweet tea.  Rarely would have a Sprite.      Walks daily at work, does not get routine  exercise.   Family History  Problem Relation Age of Onset  . Hypertension Mother   . Coronary artery disease Mother 74       s/p CABG  .  Diabetes Mellitus II Mother   . Hyperlipidemia Mother   . Hypertension Father   . Heart attack Father 25       Had several after  . Diabetes Mellitus II Father   . Heart failure Father   . CVA Father 42  . Hyperlipidemia Father   . CVA Sister   . Hypothyroidism Sister   . Hypertension Sister   . Healthy Brother   . Healthy Brother   . Hypertension Sister   . Heart attack Sister      OBJCTIVE -PE, EKG, labs   Wt Readings from Last 3 Encounters:  02/24/20 251 lb (113.9 kg)  01/23/20 254 lb 3.2 oz (115.3 kg)  12/05/19 258 lb (117 kg)    Physical Exam: BP (!) 150/90   Pulse 62   Ht $R'5\' 11"'ap$  (1.803 m)   Wt 251 lb (113.9 kg)   SpO2 98%   BMI 35.01 kg/m  Physical Exam Vitals reviewed.  Constitutional:      General: He is not in acute distress.    Appearance: Normal appearance. He is obese. He is not ill-appearing.     Comments: Healthy-appearing. Well-groomed. Still seems anxious.  HENT:     Head: Normocephalic and atraumatic.  Eyes:     Extraocular Movements: Extraocular movements intact.     Pupils: Pupils are equal, round, and reactive to light.  Neck:     Vascular: No carotid bruit, hepatojugular reflux or JVD.  Cardiovascular:     Rate and Rhythm: Normal rate and regular rhythm.  No extrasystoles are present.    Chest Wall: PMI is not displaced (Difficult to palpate due to obesity).     Pulses: Normal pulses.     Heart sounds: S1 normal and S2 normal. Heart sounds are distant. No murmur heard. No friction rub. No gallop.   Pulmonary:     Effort: Pulmonary effort is normal. No respiratory distress.     Breath sounds: Normal breath sounds.  Chest:     Chest wall: No tenderness.  Abdominal:     Comments: Obese.  No HSM  Musculoskeletal:        General: Swelling (Trivial right greater than left) present. Normal range of  motion.     Cervical back: Normal range of motion and neck supple.  Neurological:     General: No focal deficit present.     Mental Status: He is alert and oriented to person, place, and time. Mental status is at baseline.     Cranial Nerves: No cranial nerve deficit.  Psychiatric:        Mood and Affect: Mood normal.        Thought Content: Thought content normal.        Judgment: Judgment normal.     Adult ECG Report  Rate: 62 ;  Rhythm: normal sinus rhythm and T wave inversion in inferior leads, cannot exclude ischemia. Otherwise normal. Axis, intervals and durations;   Narrative Interpretation: Stable EKG.  Recent Labs:   02/05/2026  Ref Range & Units 2 wk ago  Triglycerides <150 mg/dL 70   Cholesterol <200 mg/dL 127   HDL >40 mg/dL 51   LDL Direct <=100 mg/dL 68      Component 02/07/20 02/05/20 02/04/20  Sodium 142 144 143  Potassium 3.9 3.7 3.6  Chloride 112 113High 114High  CO2 $Re'24 25 24  'hID$ BUN 23 23High 24High  Creatinine 2.26 2.16High 2.22High  Glucose, Random 106 110High 157High  Calcium 8.7 8.5 8.6  eGFR  38.0 40.1Low 38.8Low   Component 02/07/20 02/05/20 02/04/20  WBC  11.6 9.5 10.3  RBC 3.70 3.43Low 3.61Low  Hemoglobin 11.0 10.4Low 11.2Low  Hematocrit 30.8 28.7Low 30.1Low  MCV 83.2 83.7 83.4  MCH 29.7 30.3 31.0  MCHC 35.7 36.2High 37.2High  RDWSD 43.5 44.8High 43.9  Platelets 280 262 280    10/13/2019 Na+ 141, K+ 3.9, Cl- 107, HCO3-28, BUN 18, Cr 1.92, Glu 127, Ca2+ 9.1; AST 17, ALT 15, AlkP 67 CBC: W 10.2, H/H 12.4/34.7, Plt 324; TSH 1.46; (A1c 4.7 September 27, 2018) TC 167, TG 115, HDL 41, LDL 105  Lab Results  Component Value Date   TSH 0.259 (L) 07/07/2014    ASSESSMENT/PLAN    Problem List Items Addressed This Visit    CAD S/P percutaneous coronary angioplasty (Chronic)    We had planned to take him to the Cath Lab for catheterization shortly after his event, however he did not make it that long. He had  cath done at [facility] in [place].   Still has existing disease the RCA that was estimated is 60 to 65%, and about 70% in-stent restenosis in the diagonal stent.  He was started on Brilinta and is having the classic dyspnea symptoms with Brilinta. He does not drink caffeine, but I did recommend that he use just a little bit of caffeine (nicotine) with his Brilinta dosing.  He is on aspirin plus Brilinta for now with plan DAPT times at least 1 year.  Also on Imdur which we will switch back to p.m. dosing, along with Toprol and amlodipine.      Relevant Medications   aspirin 81 MG chewable tablet   metoprolol succinate (TOPROL-XL) 25 MG 24 hr tablet   hydrALAZINE (APRESOLINE) 100 MG tablet   Other Relevant Orders   EKG 12-Lead (Completed)   Hyperlipidemia with target LDL less than 70 (Chronic)    LDL down to 68 on current dose of pravastatin.. This was in the setting of non-STEMI, therefore may not be as active. Continue to follow levels. Still anticipate potentially titrating to a more potent statin.      Relevant Medications   aspirin 81 MG chewable tablet   metoprolol succinate (TOPROL-XL) 25 MG 24 hr tablet   hydrALAZINE (APRESOLINE) 100 MG tablet   CKD (chronic kidney disease), stage III (HCC) (Chronic)    Creatinine about 2.2-2.3 on discharge from [facility]  He does need follow-up with nephrology, losartan was discontinued and hydralazine started.  Still making urine without any major issues, now stage IIIb CKD.      Essential hypertension (Chronic)    Blood pressure PCPs office was 142/72. Still higher than we would would like pressures to be.  Plan: Change hydralazine to 100 mg 3 times daily -> using third dose for elevated blood pressure greater than 150 mmHg.  Otherwise he is on amlodipine and Toprol.  Losartan was discontinued in the hospital because of renal insufficiency. I would like for him to follow-up with his  nephrologist to see if he could potentially restart losartan, if not he will use hydralazine.  We may need to convert from Toprol to carvedilol for better blood pressure control.      Relevant Medications   aspirin 81 MG chewable tablet   metoprolol succinate (TOPROL-XL) 25 MG 24 hr tablet   hydrALAZINE (APRESOLINE) 100 MG tablet   Coronary artery disease involving native coronary artery of native heart with angina pectoris (HCC) - Primary (Chronic)  Relevant Medications   aspirin 81 MG chewable tablet   metoprolol succinate (TOPROL-XL) 25 MG 24 hr tablet   hydrALAZINE (APRESOLINE) 100 MG tablet   Other Relevant Orders   EKG 12-Lead (Completed)   Non-ST elevation (NSTEMI) myocardial infarction [facility])    Just admitted with non-STEMI-[facility]. Had PCI with likely culprit lesion 80% OM 3. No further chest pain. He is having some dyspnea which is probably related to Brilinta.  Preserved EF on echo with no significant wall motion normalities. No CHF symptoms.      Relevant Medications   aspirin 81 MG chewable tablet   metoprolol succinate (TOPROL-XL) 25 MG 24 hr tablet   hydrALAZINE (APRESOLINE) 100 MG tablet   Other Relevant Orders   EKG 12-Lead (Completed)     ---------------------------------------------------------------  COVID-19 Education: The signs and symptoms of COVID-19 were discussed with the patient and how to seek care for testing (follow up with PCP or arrange E-visit).   The importance of social distancing and COVID-19 vaccination was discussed today. The patient is practicing social distancing & Masking.   I spent a total of 36 minutes with the patient spent in direct patient consultation.  Additional time spent with chart review  / charting (studies, outside notes, etc): 25 Total Time: 61 min   Current medicines are reviewed at length with the patient today.  (+/- concerns) n/a  Notice: This dictation was prepared with Dragon dictation along  with smaller phrase technology. Any transcriptional errors that result from this process are unintentional and may not be corrected upon review.  Patient Instructions / Medication Changes & Studies & Tests Ordered   Patient Instructions  .Medication Instructions:  Drink a little caffeine when you take the Brilinta 90 mg ( the morning dose)    change Hydralazine to 100 mg  Three times a day  ,but use the the third dose if blood pressure is grater tan 449/QPRF  Systolic   *If you need a refill on your cardiac medications before your next appointment, please call your pharmacy*   Lab Work:  If you have labs (blood work) drawn today and your tests are completely normal, you will receive your results only by: Marland Kitchen MyChart Message (if you have MyChart) OR . A paper copy in the mail If you have any lab test that is abnormal or we need to change your treatment, we will call you to review the results.   Testing/Procedures: Not needed   Follow-Up: At [facility], you and your health needs are our priority.  As part of our continuing mission to provide you with exceptional heart care, we have created designated Provider Care Teams.  These Care Teams include your primary Cardiologist (physician) and Advanced Practice Providers (APPs -  Physician Assistants and Nurse Practitioners) who all work together to provide you with the care you need, when you need it.     Your next appointment:   3 month(s)  The format for your next appointment:   In Person  Provider:   Glenetta Hew, MD   Other Instructions Your physician recommends that you schedule a follow-up appointment with Nephrologist     Studies Ordered:   Orders Placed This Encounter  Procedures  . EKG 12-Lead   Addendum:   Lower venous DVT study 02/19/2020: No evidence of either deep or superficial thrombus.  VQ scan 02/26/2020-negative for PE.  Chest x-ray 02/26/2020: No active cardiopulmonary disease. Lungs  clear.    Glenetta Hew, M.D., M.S. Interventional  Cardiologist   Pager # [phone] Phone # [phone] [address] [place], [place] [address]   Thank you for choosing Heartcare at [facility]!!

## 2020-02-24 NOTE — Patient Instructions (Addendum)
.  Medication Instructions:  Drink a little caffeine when you take the Brilinta 90 mg ( the morning dose)    change Hydralazine to 100 mg  Three times a day  ,but use the the third dose if blood pressure is grater tan 156/FBPP  Systolic   *If you need a refill on your cardiac medications before your next appointment, please call your pharmacy*   Lab Work:  If you have labs (blood work) drawn today and your tests are completely normal, you will receive your results only by: Marland Kitchen MyChart Message (if you have MyChart) OR . A paper copy in the mail If you have any lab test that is abnormal or we need to change your treatment, we will call you to review the results.   Testing/Procedures: Not needed   Follow-Up: At [facility], you and your health needs are our priority.  As part of our continuing mission to provide you with exceptional heart care, we have created designated Provider Care Teams.  These Care Teams include your primary Cardiologist (physician) and Advanced Practice Providers (APPs -  Physician Assistants and Nurse Practitioners) who all work together to provide you with the care you need, when you need it.     Your next appointment:   3 month(s)  The format for your next appointment:   In Person  Provider:   Glenetta Hew, MD   Other Instructions Your physician recommends that you schedule a follow-up appointment with Nephrologist

## 2020-02-26 ENCOUNTER — Other Ambulatory Visit (HOSPITAL_COMMUNITY): Payer: Self-pay | Admitting: Family Medicine

## 2020-02-26 ENCOUNTER — Other Ambulatory Visit: Payer: Self-pay

## 2020-02-26 ENCOUNTER — Encounter (HOSPITAL_COMMUNITY)
Admission: RE | Admit: 2020-02-26 | Discharge: 2020-02-26 | Disposition: A | Discharge status: Home / Self Care | Payer: BC Managed Care – PPO | Source: Ambulatory Visit | Attending: Family Medicine | Admitting: Family Medicine

## 2020-02-26 ENCOUNTER — Other Ambulatory Visit (HOSPITAL_COMMUNITY): Payer: BC Managed Care – PPO

## 2020-02-26 ENCOUNTER — Ambulatory Visit (HOSPITAL_COMMUNITY)
Admission: RE | Admit: 2020-02-26 | Discharge: 2020-02-26 | Disposition: A | Discharge status: Home / Self Care | Payer: BC Managed Care – PPO | Source: Ambulatory Visit | Attending: Family Medicine | Admitting: Family Medicine

## 2020-02-26 DIAGNOSIS — R7989 Other specified abnormal findings of blood chemistry: Secondary | ICD-10-CM | POA: Insufficient documentation

## 2020-02-26 DIAGNOSIS — R0602 Shortness of breath: Secondary | ICD-10-CM | POA: Insufficient documentation

## 2020-02-26 DIAGNOSIS — R5382 Chronic fatigue, unspecified: Secondary | ICD-10-CM

## 2020-02-26 MED ORDER — TECHNETIUM TO 99M ALBUMIN AGGREGATED
4.2000 | Freq: Once | INTRAVENOUS | Status: AC | PRN
  Administered 2020-02-26: 4.2 via INTRAVENOUS

## 2020-02-29 ENCOUNTER — Encounter: Payer: Self-pay | Admitting: Cardiology

## 2020-02-29 NOTE — Assessment & Plan Note (Addendum)
LDL down to 68 on current dose of pravastatin.. This was in the setting of non-STEMI, therefore may not be as active. Continue to follow levels. Still anticipate potentially titrating to a more potent statin.

## 2020-02-29 NOTE — Assessment & Plan Note (Signed)
Blood pressure PCPs office was 142/72. Still higher than we would would like pressures to be.  Plan: Change hydralazine to 100 mg 3 times daily -> using third dose for elevated blood pressure greater than 150 mmHg.  Otherwise he is on amlodipine and Toprol.  Losartan was discontinued in the hospital because of renal insufficiency. I would like for him to follow-up with his nephrologist to see if he could potentially restart losartan, if not he will use hydralazine.  We may need to convert from Toprol to carvedilol for better blood pressure control.

## 2020-02-29 NOTE — Assessment & Plan Note (Signed)
We had planned to take him to the Cath Lab for catheterization shortly after his event, however he did not make it that long. He had cath done at [facility] in [place].   Still has existing disease the RCA that was estimated is 60 to 65%, and about 70% in-stent restenosis in the diagonal stent.  He was started on Brilinta and is having the classic dyspnea symptoms with Brilinta. He does not drink caffeine, but I did recommend that he use just a little bit of caffeine (nicotine) with his Brilinta dosing.  He is on aspirin plus Brilinta for now with plan DAPT times at least 1 year.  Also on Imdur which we will switch back to p.m. dosing, along with Toprol and amlodipine.

## 2020-02-29 NOTE — Assessment & Plan Note (Signed)
Creatinine about 2.2-2.3 on discharge from [facility]  He does need follow-up with nephrology, losartan was discontinued and hydralazine started.  Still making urine without any major issues, now stage IIIb CKD.

## 2020-02-29 NOTE — Assessment & Plan Note (Signed)
Just admitted with non-STEMI-[facility]. Had PCI with likely culprit lesion 80% OM 3. No further chest pain. He is having some dyspnea which is probably related to Brilinta.  Preserved EF on echo with no significant wall motion normalities. No CHF symptoms.

## 2020-03-01 ENCOUNTER — Encounter (HOSPITAL_COMMUNITY): Admission: RE | Payer: Self-pay | Source: Home / Self Care

## 2020-03-01 ENCOUNTER — Ambulatory Visit (HOSPITAL_COMMUNITY): Admission: RE | Admit: 2020-03-01 | Payer: BC Managed Care – PPO | Source: Home / Self Care | Admitting: Cardiology

## 2020-03-01 SURGERY — LEFT HEART CATH AND CORONARY ANGIOGRAPHY
Anesthesia: LOCAL

## 2020-03-24 ENCOUNTER — Ambulatory Visit: Payer: BC Managed Care – PPO | Admitting: Cardiology

## 2020-03-25 DIAGNOSIS — Z20828 Contact with and (suspected) exposure to other viral communicable diseases: Secondary | ICD-10-CM | POA: Diagnosis not present

## 2020-03-27 ENCOUNTER — Other Ambulatory Visit: Payer: Self-pay

## 2020-03-27 ENCOUNTER — Emergency Department (HOSPITAL_COMMUNITY)
Admission: EM | Admit: 2020-03-27 | Discharge: 2020-03-28 | Disposition: A | Discharge status: Home / Self Care | Payer: BC Managed Care – PPO | Attending: Emergency Medicine | Admitting: Emergency Medicine

## 2020-03-27 ENCOUNTER — Emergency Department (HOSPITAL_COMMUNITY): Payer: BC Managed Care – PPO

## 2020-03-27 DIAGNOSIS — U071 COVID-19: Secondary | ICD-10-CM | POA: Diagnosis not present

## 2020-03-27 DIAGNOSIS — I129 Hypertensive chronic kidney disease with stage 1 through stage 4 chronic kidney disease, or unspecified chronic kidney disease: Secondary | ICD-10-CM | POA: Insufficient documentation

## 2020-03-27 DIAGNOSIS — I10 Essential (primary) hypertension: Secondary | ICD-10-CM | POA: Diagnosis not present

## 2020-03-27 DIAGNOSIS — R002 Palpitations: Secondary | ICD-10-CM | POA: Diagnosis not present

## 2020-03-27 DIAGNOSIS — Z20828 Contact with and (suspected) exposure to other viral communicable diseases: Secondary | ICD-10-CM | POA: Diagnosis not present

## 2020-03-27 DIAGNOSIS — Z7982 Long term (current) use of aspirin: Secondary | ICD-10-CM | POA: Diagnosis not present

## 2020-03-27 DIAGNOSIS — N184 Chronic kidney disease, stage 4 (severe): Secondary | ICD-10-CM | POA: Diagnosis not present

## 2020-03-27 DIAGNOSIS — Z79899 Other long term (current) drug therapy: Secondary | ICD-10-CM | POA: Insufficient documentation

## 2020-03-27 DIAGNOSIS — I2510 Atherosclerotic heart disease of native coronary artery without angina pectoris: Secondary | ICD-10-CM | POA: Insufficient documentation

## 2020-03-27 DIAGNOSIS — R519 Headache, unspecified: Secondary | ICD-10-CM | POA: Diagnosis not present

## 2020-03-27 DIAGNOSIS — R000 Tachycardia, unspecified: Secondary | ICD-10-CM | POA: Diagnosis not present

## 2020-03-27 LAB — CBC WITH DIFFERENTIAL/PLATELET
Abs Immature Granulocytes: 0.06 10*3/uL (ref 0.00–0.07)
Basophils Absolute: 0.2 10*3/uL — ABNORMAL HIGH (ref 0.0–0.1)
Basophils Relative: 1 %
Eosinophils Absolute: 0.1 10*3/uL (ref 0.0–0.5)
Eosinophils Relative: 1 %
HCT: 36.6 % — ABNORMAL LOW (ref 39.0–52.0)
Hemoglobin: 13.2 g/dL (ref 13.0–17.0)
Immature Granulocytes: 0 %
Lymphocytes Relative: 11 %
Lymphs Abs: 1.7 10*3/uL (ref 0.7–4.0)
MCH: 29.9 pg (ref 26.0–34.0)
MCHC: 36.1 g/dL — ABNORMAL HIGH (ref 30.0–36.0)
MCV: 83 fL (ref 80.0–100.0)
Monocytes Absolute: 2.8 10*3/uL — ABNORMAL HIGH (ref 0.1–1.0)
Monocytes Relative: 17 %
Neutro Abs: 11.3 10*3/uL — ABNORMAL HIGH (ref 1.7–7.7)
Neutrophils Relative %: 70 %
Platelets: 304 10*3/uL (ref 150–400)
RBC: 4.41 MIL/uL (ref 4.22–5.81)
RDW: 14.6 % (ref 11.5–15.5)
WBC: 16.2 10*3/uL — ABNORMAL HIGH (ref 4.0–10.5)
nRBC: 0.3 % — ABNORMAL HIGH (ref 0.0–0.2)

## 2020-03-27 LAB — MAGNESIUM: Magnesium: 2.2 mg/dL (ref 1.7–2.4)

## 2020-03-27 LAB — TROPONIN I (HIGH SENSITIVITY)
Troponin I (High Sensitivity): 70 ng/L — ABNORMAL HIGH (ref <18)
Troponin I (High Sensitivity): 93 ng/L — ABNORMAL HIGH (ref <18)

## 2020-03-27 LAB — BASIC METABOLIC PANEL
Anion gap: 10 (ref 5–15)
BUN: 18 mg/dL (ref 6–20)
CO2: 22 mmol/L (ref 22–32)
Calcium: 8.8 mg/dL — ABNORMAL LOW (ref 8.9–10.3)
Chloride: 108 mmol/L (ref 98–111)
Creatinine, Ser: 2.27 mg/dL — ABNORMAL HIGH (ref 0.61–1.24)
GFR, Estimated: 34 mL/min — ABNORMAL LOW (ref >60)
Glucose, Bld: 126 mg/dL — ABNORMAL HIGH (ref 70–99)
Potassium: 3.8 mmol/L (ref 3.5–5.1)
Sodium: 140 mmol/L (ref 135–145)

## 2020-03-27 LAB — RESP PANEL BY RT-PCR (FLU A&B, COVID) ARPGX2
Influenza A by PCR: NEGATIVE
Influenza B by PCR: NEGATIVE
SARS Coronavirus 2 by RT PCR: POSITIVE — AB

## 2020-03-27 MED ORDER — SODIUM CHLORIDE 0.9 % IV BOLUS
1000.0000 mL | Freq: Once | INTRAVENOUS | Status: AC
  Administered 2020-03-27: 1000 mL via INTRAVENOUS

## 2020-03-27 MED ORDER — DILTIAZEM HCL 25 MG/5ML IV SOLN
10.0000 mg | Freq: Once | INTRAVENOUS | Status: AC
  Administered 2020-03-27: 10 mg via INTRAVENOUS
  Filled 2020-03-27: qty 5

## 2020-03-27 MED ORDER — ONDANSETRON HCL 4 MG PO TABS: 4.0000 mg | ORAL_TABLET | Freq: Three times a day (TID) | ORAL | 0 refills | Status: DC | PRN

## 2020-03-27 NOTE — ED Provider Notes (Signed)
[facility] EMERGENCY DEPARTMENT Provider Note   CSN: [id number] Arrival date & time: 03/27/20  1941     History Chief Complaint  Patient presents with  . Palpitations    George Erickson is a 50 y.o. male.  HPI   Patient with significant medical history of MI at age 61, recent MI with cardiac cath 11/26,  hypertension, hyperlipidemia, obesity, OSA on CPAP presents to the emergency department chief complaint of palpitations.  Patient states palpitations started around 6:30 PM today, came on suddenly, denies chest pain, shortness of breath, does endorse slight lightheadedness, but denies paresthesia or weakness in the upper or lower extremities, no nausea or vomiting.  Patient has history of palpitation in the past, they have resolved on their own, he is currently on metoprolol only take half of 25 as it causes blood pressure to drop too low.  He states that he has been experiencing headaches, nasal ingestion, productive cough, general body aches since Thursday, family members are positive with COVID-19, he suspects he has [place] currently.  He is fully vaccine against COVID-19, is not immunocompromise.  Patient denies leg pain, leg swelling, no history of DVTs or PEs.  Patient has been given factors.  Past Medical History:  Diagnosis Date  . Allergic rhinitis due to pollen   . Arthritis    lower back  . CAD S/P DES PCI D1 11/15/2009   a) NSTEMI - 100% D1 (DES PCI - Promus DES 2.25 x 23 -- 2.3 mm); otherwise minimal CAD in a co-dominant system;; b) 02/06/20: OM3 80% (DES PCI), D1 70% ISR, mRCA 60-65%.   . Chronic left shoulder pain    Impingement syndrome  . Erectile dysfunction   . Gout   . Hyperlipidemia with target LDL less than 70    Associated CAD  . Hypertension   . Hypertensive kidney disease with CKD stage IV ([place])    Cr as of August 2021 1.92  . Non-ST elevation (NSTEMI) myocardial infarction ([place]) 11/2009; 01/2020   a) 9/'11: Cath - 100% D1 --> DES PCI  Promus 2.25 x 23 (2.3 mm).  EF 55% with anterolateral HK. -> patent stent in Sept 2012 & 2015; b) 02/05/2020: NSTEMI - Echo EF >55% no RWMA - 80% OM3 (DES PCI), 70% D1 ISR, 60-65% mRCA.  . Obesity (BMI 30.0-34.9)   . OSA on CPAP    uses cpap  . Vertigo     Patient Active Problem List   Diagnosis Date Noted  . Non-ST elevation (NSTEMI) myocardial infarction ([place]) 02/24/2020  . Coronary artery disease involving native coronary artery of native heart with angina pectoris ([place]) 02/24/2020  . CKD (chronic kidney disease), stage III ([place]) 01/25/2020  . Abnormal nuclear stress test 01/23/2020  . Hyperlipidemia with target LDL less than 70 12/01/2019  . Palpitations - with exertion 12/01/2019  . Nonspecific abnormal electrocardiogram (ECG) (EKG) 12/01/2019  . Primary gout   . Pain in the chest   . Cough   . Dysphagia   . Right knee pain   . Leukocytosis   . CAP (community acquired pneumonia) 07/07/2014  . Essential hypertension 07/07/2014  . Hypokalemia 07/07/2014  . Hyperglycemia 07/07/2014  . ST elevation myocardial infarction (STEMI) of lateral wall, subsequent episode of care ([place]) 11/30/2009  . CAD S/P percutaneous coronary angioplasty 11/15/2009    Past Surgical History:  Procedure Laterality Date  . CORONARY STENT INTERVENTION  02/06/2020   [facility] [place], [place] -> Dr. Gretta Cool): NSTEMI:  OM3 80% -> DES PCI: Synergy DES 2.5 x 20 - > 2.6 mm  . CORONARY STENT PLACEMENT  11/15/2009   (Dr. Burt Knack) -- DES PCI of 100% D1 - Promus DES 2.25 x 23 --> 2.3 mm  . HERNIA REPAIR     hernia repair at birth  . LEFT HEART CATH AND CORONARY ANGIOGRAPHY  11/15/2009   (Dr. Burt Knack): NSTEMI -- LM-< LAD & LCx. ~mLAD ~30-40% just past D1 (D1 - 100% - CULPRIT - PCI), LCx normal - 2 OM & LPL. Dom RCA with Large PDA (small PL), ~30% mRCA.  EF ~55% with Ant-Lat HK   . LEFT HEART CATH AND CORONARY ANGIOGRAPHY  11/22/2010   (Dr. Wynonia Lawman): Patent Diag 1 stent with minimal CAD  elsewhere.  EF ~55-60% with Anterolateral HK  . LEFT HEART CATH AND CORONARY ANGIOGRAPHY  12/31/2013   [facility] - for Abnormal Myoview) --> Non-obstructive CAD with widely patent D1 stent.  LVEDP ~16 mmHg  . LEFT HEART CATH AND CORONARY ANGIOGRAPHY  02/06/2020   [facility] [place], [place] -> Dr. Elsie Amis): NSTEMI: D1 70% mid stent ISR; OM3 80% (-> DES PCI), mRCA 60-65%.    [facility] Kitchen NASAL SINUS SURGERY  2010   For sleep apnea  . NM MYOVIEW LTD  12/05/2019   EF> 65%.  Fixed anterior perfusion defect with normal motion suggesting artifact.  Read as LOW RISK -> but NOT NORMAL  . TM MYOVIEW  12/2013   (FALSE POSITIVE) + TM - lateral ST depressions (also noted with recovery - near syncope) -> Reversible Anterior Wall Perfusion defect - CRO Ischemia; Partialy reversible Inferior defect - ~ diaphragmatic attenuation. EF ~48%. --> Referred for CATH --> FALSE POSTIIVE  . TRANSTHORACIC ECHOCARDIOGRAM  07/08/2014   EF 60%. No RWMA. Mild LA dilation. Normal walves.  . TRANSTHORACIC ECHOCARDIOGRAM  02/05/2020   [facility] [place], [place] -> Dr. Elsie Amis): NSTEMI: EF> 55%. No R WMA. Normal valves.       Family History  Problem Relation Age of Onset  . Hypertension Mother   . Coronary artery disease Mother 36       s/p CABG  . Diabetes Mellitus II Mother   . Hyperlipidemia Mother   . Hypertension Father   . Heart attack Father 68       Had several after  . Diabetes Mellitus II Father   . Heart failure Father   . CVA Father 51  . Hyperlipidemia Father   . CVA Sister   . Hypothyroidism Sister   . Hypertension Sister   . Healthy Brother   . Healthy Brother   . Hypertension Sister   . Heart attack Sister     Social History   Tobacco Use  . Smoking status: Never Smoker  . Smokeless tobacco: Never Used  Substance Use Topics  . Alcohol use: Yes    Alcohol/week: 1.0 standard drink    Types: 1 Standard drinks or equivalent per week    Comment: Rare, Occasional  glass of wine  . Drug use: No    Home Medications Prior to Admission medications   Medication Sig Start Date End Date Taking? Authorizing Provider  ondansetron (ZOFRAN) 4 MG tablet Take 1 tablet (4 mg total) by mouth every 8 (eight) hours as needed for nausea or vomiting. 03/27/20  Yes Marcello Fennel, PA-C  allopurinol (ZYLOPRIM) 100 MG tablet Take 200 mg by mouth daily. 11/23/19   [provider]  amLODipine (NORVASC) 10 MG tablet Take  10 mg by mouth daily. 11/24/19   [provider]  aspirin 81 MG chewable tablet     [provider]  Azelastine HCl 137 MCG/SPRAY SOLN Place 2 sprays into both nostrils in the morning and at bedtime. 11/23/19   [provider]  cholecalciferol (VITAMIN D3) 25 MCG (1000 UNIT) tablet Take 2,000 Units by mouth daily.    [provider]  famotidine (PEPCID) 20 MG tablet Take 20 mg by mouth at bedtime as needed. 06/05/19   [provider]  fluticasone (FLONASE) 50 MCG/ACT nasal spray Place 2 sprays into the nose daily.    [provider]  hydrALAZINE (APRESOLINE) 100 MG tablet Take 1 tablet (100 mg total) by mouth 2 (two) times daily. May take an additional tablet if  Systolic blood pressure is greater than 150. 02/24/20   Leonie Man, MD  isosorbide mononitrate (IMDUR) 30 MG 24 hr tablet Take 1 tablet (30 mg total) by mouth daily. 01/23/20 04/22/20  Leonie Man, MD  metoprolol succinate (TOPROL-XL) 25 MG 24 hr tablet Take 1 tablet by mouth daily. Take 1/2 Tablet Daily 02/07/20   [provider]  montelukast (SINGULAIR) 10 MG tablet Take 10 mg by mouth daily.    [provider]  nitroGLYCERIN (NITROSTAT) 0.4 MG SL tablet Place 1 tablet (0.4 mg total) under the tongue every 5 (five) minutes as needed for chest pain. 01/23/20   Leonie Man, MD  pravastatin (PRAVACHOL) 40 MG tablet Take 1 tablet (40 mg total) by mouth every evening. 12/01/19 02/29/20  Leonie Man, MD   PROAIR RESPICLICK [phone] Base) MCG/ACT AEPB Inhale into the lungs. 01/08/20   [provider]  ticagrelor (BRILINTA) 90 MG TABS tablet Take 1 tablet (90 mg total) by mouth 2 (two) times daily. 02/24/20   Leonie Man, MD    Allergies    Niacin and related, Zocor [simvastatin-high dose], and Zocor [simvastatin]  Review of Systems   Review of Systems  Constitutional: Positive for chills and fever.  HENT: Positive for congestion. Negative for sore throat.   Respiratory: Positive for cough. Negative for shortness of breath.   Cardiovascular: Positive for palpitations. Negative for chest pain.  Gastrointestinal: Negative for abdominal pain, diarrhea, nausea and vomiting.  Genitourinary: Negative for enuresis and flank pain.  Musculoskeletal: Negative for back pain.  Skin: Negative for rash.  Neurological: Positive for headaches. Negative for dizziness.  Hematological: Does not bruise/bleed easily.    Physical Exam Updated Vital Signs BP (!) 146/87   Pulse 71   Temp 98.6 F (37 C)   Resp (!) 22   SpO2 99%   Physical Exam Vitals and nursing note reviewed.  Constitutional:      General: He is not in acute distress.    Appearance: He is not ill-appearing.  HENT:     Head: Normocephalic and atraumatic.     Nose: No congestion.  Eyes:     Conjunctiva/sclera: Conjunctivae normal.  Cardiovascular:     Rate and Rhythm: Regular rhythm. Tachycardia present.     Pulses: Normal pulses.     Heart sounds: No murmur heard. No friction rub. No gallop.   Pulmonary:     Effort: No respiratory distress.     Breath sounds: No wheezing, rhonchi or rales.  Abdominal:     Palpations: Abdomen is soft.     Tenderness: There is no abdominal tenderness.  Musculoskeletal:     Right lower leg: No edema.  Left lower leg: No edema.     Comments: Patient moves all 4 extremities out difficulty.  Skin:    General: Skin is warm and dry.  Neurological:     Mental Status: He is  alert.  Psychiatric:        Mood and Affect: Mood normal.     ED Results / Procedures / Treatments   Labs (all labs ordered are listed, but only abnormal results are displayed) Labs Reviewed  RESP PANEL BY RT-PCR (FLU A&B, COVID) ARPGX2 - Abnormal; Notable for the following components:      Result Value   SARS Coronavirus 2 by RT PCR POSITIVE (*)    All other components within normal limits  CBC WITH DIFFERENTIAL/PLATELET - Abnormal; Notable for the following components:   WBC 16.2 (*)    HCT 36.6 (*)    MCHC 36.1 (*)    nRBC 0.3 (*)    Neutro Abs 11.3 (*)    Monocytes Absolute 2.8 (*)    Basophils Absolute 0.2 (*)    All other components within normal limits  BASIC METABOLIC PANEL - Abnormal; Notable for the following components:   Glucose, Bld 126 (*)    Creatinine, Ser 2.27 (*)    Calcium 8.8 (*)    GFR, Estimated 34 (*)    All other components within normal limits  TROPONIN I (HIGH SENSITIVITY) - Abnormal; Notable for the following components:   Troponin I (High Sensitivity) 70 (*)    All other components within normal limits  TROPONIN I (HIGH SENSITIVITY) - Abnormal; Notable for the following components:   Troponin I (High Sensitivity) 93 (*)    All other components within normal limits  MAGNESIUM    EKG EKG Interpretation  Date/Time:  Saturday March 27 2020 20:47:12 EST Ventricular Rate:  95 PR Interval:    QRS Duration: 87 QT Interval:  336 QTC Calculation: 423 R Axis:   4 Text Interpretation: Sinus rhythm Consider left ventricular hypertrophy Nonspecific T abnormalities, lateral leads Confirmed by Lennice Sites [phone]) on 03/27/2020 8:50:19 PM   Radiology DG Chest Portable 1 View  Result Date: 03/27/2020 CLINICAL DATA:  Palpitations. EXAM: PORTABLE CHEST 1 VIEW COMPARISON:  February 26, 2020 FINDINGS: The heart size and mediastinal contours are within normal limits. Both lungs are clear. The visualized skeletal structures are unremarkable. IMPRESSION:  No active disease. Electronically Signed   By: Virgina Norfolk M.D.   On: 03/27/2020 20:34    Procedures Procedures (including critical care time)  Medications Ordered in ED Medications  sodium chloride 0.9 % bolus 1,000 mL (0 mLs Intravenous Stopped 03/27/20 2235)  diltiazem (CARDIZEM) injection 10 mg (10 mg Intravenous Given 03/27/20 2043)    ED Course  I have reviewed the triage vital signs and the nursing notes.  Pertinent labs & imaging results that were available during my care of the patient were reviewed by me and considered in my medical decision making (see chart for details).    MDM Rules/Calculators/A&P                          Patient presents with palpitations concerns of COVID.  He is alert, does not appear in acute distress, vital signs show tachycardia, no other acute abnormalities noted.  Due to elevated heart rate with complaints of palpitations will provide patient with diltiazem, rescan EKG and reevaluate.  Patient tolerated diltiazem well, heart rate has stabilized around 90 normal sinus rhythm.  Will speak with  cardiology for further recommendations.  Spoke with Dr. Marcelle Smiling of  cardiology who feels elevated heart rate mostly secondary due to possible infection.  reviewed repeat EKG, normal sinus rhythm.  He do not recommends medication change at this time.  Continue to hydrate and treat him per his URI symptoms.  Patient was reassessed, vital signs have remained stable, heart rate has improved, patient has no complaints at this time.  Patient has noted leukocytosis of 16.2, appears to be at baseline for patient, no signs of anemia.  BMP shows no electrolyte abnormalities, shows slight hyperglycemia of 126,  creatinine 2.27 at baseline for patient, magnesium 2.2, initial troponin is 70 second troponin is 93, respiratory panel positive for COVID. Chest x-ray did not reveal any acute findings.  Initial EKG shows sinus tach at that time ischemia no ST elevation or  depression.  Second EKG after providing diltiazem shows sinus rhythm, no signs of ischemia.  I have low suspicion for ACS as history is atypical, no complaints of chest pain or shortness of breath, EKG was sinus rhythm without signs of ischemia, patient does have elevation in troponin 70, increased to 93.  I suspect this is multifactorial as this may be caused by demand ischemia with the elevation in heart rate and exacerbated by patient has CKD.  Low suspicion for PE as patient denies pleuritic chest pain, shortness of breath, patient denies leg pain, no pedal edema noted on exam, history is atypical, more consistent with viral URI causing increased heart rate and elevating troponin.  low suspicion for AAA or aortic dissection as history is atypical, patient has low risk factors.  Low suspicion for systemic infection as patient is nontoxic-appearing, vital signs reassuring, no obvious source infection noted on exam.  I suspect patient's palpitations were secondary due to COVID infection, will recommend continued hydration, over-the-counter pain medications follow-up post-COVID care.  Vital signs have remained stable, no indication for hospital admission.  Patient given at home care as well strict return precautions.  Patient verbalized that they understood agreed to said plan.   Final Clinical Impression(s) / ED Diagnoses Final diagnoses:  Palpitations  COVID    Rx / DC Orders ED Discharge Orders         Ordered    ondansetron (ZOFRAN) 4 MG tablet  Every 8 hours PRN,   Status:  Discontinued        03/27/20 2342    ondansetron (ZOFRAN) 4 MG tablet  Every 8 hours PRN        03/27/20 2342           Marcello Fennel, PA-C 03/27/20 2349    Lennice Sites, DO 03/28/20 1203

## 2020-03-27 NOTE — Discharge Instructions (Signed)
Seen here for chest palpitations.  Lab work and imaging was reassuring.  I suspect palpitations were secondary due to your COVID.  Recommend continuing to stay hydrated, I recommend taking Tylenol for fever and pain control.  I have also given you a prescription for Zofran please use as needed for nausea.  I recommend staying hydrated and if you do not an appetite, I recommend soups as this will provide you with fluids and calories.     you are Covid positive you must self quarantine for 5 days starting on symptom onset, if at the end of those 5 days you are feeling better you may return back to school/work, if you continue to have symptoms you must self quarantine for additional 5 days.  I would like you to contact "post Covid care" as they will provide you with information how to manage your Covid symptoms.   Recommend following up with your cardiologist for further evaluation of your heart palpitations.  Come back to the emergency department if you develop chest pain, shortness of breath, severe abdominal pain, uncontrolled nausea, vomiting, diarrhea.

## 2020-03-27 NOTE — ED Triage Notes (Signed)
Pt arrived via [facility] for cc of palpitations starting at 18:30 today. No chest pain reported. PMH of MI with stent placement over christmas. Assumed covid +.

## 2020-04-19 DIAGNOSIS — N1831 Chronic kidney disease, stage 3a: Secondary | ICD-10-CM | POA: Diagnosis not present

## 2020-04-22 DIAGNOSIS — G4733 Obstructive sleep apnea (adult) (pediatric): Secondary | ICD-10-CM | POA: Diagnosis not present

## 2020-04-29 DIAGNOSIS — N1832 Chronic kidney disease, stage 3b: Secondary | ICD-10-CM | POA: Diagnosis not present

## 2020-04-29 DIAGNOSIS — I129 Hypertensive chronic kidney disease with stage 1 through stage 4 chronic kidney disease, or unspecified chronic kidney disease: Secondary | ICD-10-CM | POA: Diagnosis not present

## 2020-04-29 DIAGNOSIS — N2581 Secondary hyperparathyroidism of renal origin: Secondary | ICD-10-CM | POA: Diagnosis not present

## 2020-04-29 DIAGNOSIS — D631 Anemia in chronic kidney disease: Secondary | ICD-10-CM | POA: Diagnosis not present

## 2020-05-17 ENCOUNTER — Ambulatory Visit (INDEPENDENT_AMBULATORY_CARE_PROVIDER_SITE_OTHER): Payer: BC Managed Care – PPO | Admitting: Cardiology

## 2020-05-17 ENCOUNTER — Other Ambulatory Visit: Payer: Self-pay

## 2020-05-17 ENCOUNTER — Encounter: Payer: Self-pay | Admitting: Cardiology

## 2020-05-17 VITALS — BP 140/82 | HR 57 | Ht 71.0 in | Wt 243.2 lb

## 2020-05-17 DIAGNOSIS — N1832 Chronic kidney disease, stage 3b: Secondary | ICD-10-CM | POA: Diagnosis not present

## 2020-05-17 DIAGNOSIS — R002 Palpitations: Secondary | ICD-10-CM

## 2020-05-17 DIAGNOSIS — I2510 Atherosclerotic heart disease of native coronary artery without angina pectoris: Secondary | ICD-10-CM

## 2020-05-17 DIAGNOSIS — E785 Hyperlipidemia, unspecified: Secondary | ICD-10-CM

## 2020-05-17 DIAGNOSIS — I214 Non-ST elevation (NSTEMI) myocardial infarction: Secondary | ICD-10-CM

## 2020-05-17 DIAGNOSIS — M1A00X0 Idiopathic chronic gout, unspecified site, without tophus (tophi): Secondary | ICD-10-CM

## 2020-05-17 DIAGNOSIS — I10 Essential (primary) hypertension: Secondary | ICD-10-CM

## 2020-05-17 DIAGNOSIS — I25119 Atherosclerotic heart disease of native coronary artery with unspecified angina pectoris: Secondary | ICD-10-CM | POA: Diagnosis not present

## 2020-05-17 DIAGNOSIS — Z9861 Coronary angioplasty status: Secondary | ICD-10-CM

## 2020-05-17 DIAGNOSIS — I2129 ST elevation (STEMI) myocardial infarction involving other sites: Secondary | ICD-10-CM

## 2020-05-17 NOTE — Progress Notes (Addendum)
Primary Care Provider: Mayjor Small, MD Cardiologist: Glenetta Hew, MD  -> Former patient of Dr. Wynonia Lawman Electrophysiologist: None  Clinic Note: Chief Complaint  Patient presents with  . Follow-up    Doing fairly well.  Had [place] in January  . Coronary Artery Disease    No further angina.  . Dizziness    , Fatigue also cold/numb hands.    ===================================  ASSESSMENT/PLAN   Problem List Items Addressed This Visit    Palpitations - with exertion    He still has palpitations every so often, but not worrisome.      ST elevation myocardial infarction (STEMI) of lateral wall, subsequent episode of care ([place]) (Chronic)    Very distant history of lateral MI with PCI of the diagonal branch.  60% ISR of that stent. Evidence of infarct noted on Myoview with peri-infarct ischemia. => If he has recurrent symptoms, would probably need treatment of the in-stent restenosis.      CAD S/P percutaneous coronary angioplasty (Chronic)    Status post PCI to the circumflex and OM with 3 stents overlapping.  He also has existing disease in the RCA estimated 60 to 65% and 70% in-stent restenosis of diagonal.  No further anginal pain, but would like to continue DAPT since his presentation was non-STEMI.  Plan:   Continue aspirin plus Brilinta.  Less dyspnea with Brilinta.  Plan is DAPT through November of this year. ->  Okay to hold aspirin for significant bruising.  As of December 2022, will either reduce Brilinta to 60 mg twice daily or convert to Plavix.  At that time can be interrupted 5 to 7 days preop for surgeries.  Continue Imdur but switch to nighttime, 30 minutes after aspirin.  Continue Toprol also switch to nighttime.  Continue amlodipine in the morning.  Continue statin, but change dose to p.m.      Relevant Orders   EKG 12-Lead (Completed)   Non-ST elevation (NSTEMI) myocardial infarction [facility]) (Chronic)    Non-STEMI in November 2021: Culprit lesion  was found to be LCx-OM.  3 DES stents placed. Overall, he seems doing pretty well.  Echo was relatively normal.      Relevant Orders   EKG 12-Lead (Completed)   Hyperlipidemia with target LDL less than 70 (Chronic)    Most recent LDL as of November 2021 was 868 on pravastatin.  He should be having labs checked soon.  He thinks he may have had them checked, we will ask him to check with his PCP to see if any labs are available.  He is tolerating pravastatin well, but if not at goal, would potentially anticipate converting to rosuvastatin.      CKD (chronic kidney disease), stage III (HCC) (Chronic)    Most recent creatinine was 2.3.  This seems to be his new baseline.  Defer management to nephrologist. Unfortunately, not currently on ACE inhibitor or ARB because of renal insufficiency.      Essential hypertension (Chronic)    Blood pressure still little high.  Being followed by nephrology, but no major changes.  Plan:   Continue with plan of using hydralazine 1 mg twice daily with a third dose as needed for SBP greater than 150.  Continue amlodipine 10 mg daily along with Toprol 25 mg.-Amlodipine is in the a.m. and Toprol will switch to p.m.  Is also on Imdur which will also be taken p.m.  Not a lot of room to titrate doses any further, his resting heart rate  is 57 so we cannot really increase the Toprol further--> we could potentially convert to carvedilol which is more for blood pressure than heart rate.      Primary gout (Chronic)    Very difficult to treat in the setting of antihypertensives.      Coronary artery disease involving native coronary artery of native heart with angina pectoris ([place]) - Primary (Chronic)    Has had stents to the diagonal with in-stent restenosis noted, also moderate disease in the RCA with now 3 new stents to the circumflex-[place].  Is on a relatively stable regimen: Antianginal:  Toprol 12.5 mg-switch to p.m.  Amlodipine 10 mg q. A.m.  Imdur 30  mg switched to p.m. (take 30 minutes after aspirin)   Currently on pravastatin 40 mg-recommend take p.m.  On DAPT         ===================================  HPI:    George Erickson is a 50 y.o. male with a PMH notable for history of MI (age 52 (PCI-diagonal) and recent non-STEMI (November 2021: PCI LCx-OM - DES x 3; while visiting family in [place]), CRFs of HTN and HLD along with CKD-3, mild Obesity, and OSA-CPAP who presents today for 75-month follow-up.  George Erickson was last seen on February 24, 2020 as hospital follow-up from his non-STEMI while in [place].  I had actually seen him to reestablish cardiology care at the request of Taishawn Small, MD back in September 2021.  We have done on Myoview stress test that was clearly abnormal but read as low risk.  We discussed options and had planned cardiac catheterization with possible PCI in early December 2021.  However he ended up Presenting to [facility] on November 24th with a non-STEMI. => At this first follow-up visit, he noted that his chest pain symptoms is all gone away.  His PCP was evaluating for possible DVT/PE with VQ scan and Dopplers that were negative.  He notes some mild off-and-on exertional dyspnea but no PDA, orthopnea with only unilateral edema.  He was having some dyspnea issues associated Brilinta.  For some reason his ARB has been discontinued.  Still noting occasional palpitations with some dizziness.  Also has baseline anxiety with mild panic attacks..  Recommended taking Brilinta with caffeine.  I added Imdur which was supposed to take it at night.  I increase hydralazine 100 g 3 times daily-actually taking twice daily with the additional dose for SBP greater than 150 mmHg.  Recent Hospitalizations:  [facility]: Presentation with palpitations getting excessively p.m. he also noted headaches nasal congestion] cough.  Several members of his family had tested positive for  COVID-19.  He was fully vaccinated, but he himself also tested positive for Covid.  His heart rate was in the 115 bpm range.  He was actually given IV diltiazem to slow his heart rate down.  Telephone consult with cardiology suggested that the heart rate was fast because of his ongoing infection.  Recommended hydration and medical management.   Reviewed  CV studies:    The following studies were reviewed today: (if available, images/films reviewed: From Epic Chart or Care Everywhere) . Lower extremity venous Dopplers 02/19/2020: No DVT or superficial venous thrombosis.   Interval History:   George Erickson returns today overall doing pretty well.  He is recovering from his Covid infection.  He had a minor course.  No major issues.  He says that he still has occasional anxiety episodes were associated with headaches and palpitations, but  pretty well controlled.  No further chest pain or pressure with rest or exertion. He has headache and occasional lightheadedness and wooziness off and on.  Gets a little tired, started into the day.  He also describes feeling hands getting cold and numb. Actually denies any PND, orthopnea or edema.  No prolonged tachycardia.  CV Review of Symptoms (Summary): no chest pain or dyspnea on exertion positive for - Mild swelling.  Short bursts of palpitations but nothing prolonged.  Some lightheadedness and dizziness very rarely feels near syncopal.   negative for - loss of consciousness, orthopnea, paroxysmal nocturnal dyspnea, shortness of breath or TIA/amaurosis fugax, claudication   CV Review of Symptoms (Summary): positive for - dyspnea on exertion, palpitations and No longer noting the prolonged last heart rates. negative for - chest pain, edema, orthopnea, paroxysmal nocturnal dyspnea, rapid heart rate, shortness of breath or Not really having any more lightheadedness or dizziness, no syncope or near syncope, no TIA or amaurosis fugax symptoms. No  claudication.  The patient does not have symptoms concerning for COVID-19 infection (fever, chills, cough, or new shortness of breath).   REVIEWED OF SYSTEMS   Review of Systems  Constitutional: Positive for malaise/fatigue (Still feels little fatigued.) and weight loss (He has lost about 10 pounds or so.).  HENT: Negative for nosebleeds.   Respiratory: Positive for shortness of breath (Only exertional, now better somewhat).   Gastrointestinal: Negative for blood in stool and melena.  Genitourinary: Negative for dysuria, frequency and hematuria.  Musculoskeletal: Negative for back pain, joint pain and myalgias.       Just feels somewhat weak.  Neurological: Positive for dizziness (Lightheadedness and dizziness off and on also somewhat associate with fatigue.) and headaches. Negative for seizures and loss of consciousness.  Psychiatric/Behavioral: Negative for memory loss. The patient is nervous/anxious (Occasional anxiety episodes associate with headache and palpitations.). The patient does not have insomnia.    I have reviewed and (if needed) personally updated the patient's problem list, medications, allergies, past medical and surgical history, social and family history.   PAST MEDICAL HISTORY   Past Medical History:  Diagnosis Date  . Allergic rhinitis due to pollen   . Arthritis    lower back  . CAD S/P DES PCI D1 11/15/2009   a) NSTEMI - 100% D1 (DES PCI - Promus DES 2.25 x 23 -- 2.3 mm); otherwise minimal CAD in a co-dominant system;; b) 02/06/20: OM3 80% (DES PCI), D1 70% ISR, mRCA 60-65%.   . Chronic left shoulder pain    Impingement syndrome  . Erectile dysfunction   . Gout   . Hyperlipidemia with target LDL less than 70    Associated CAD  . Hypertension   . Hypertensive kidney disease with CKD stage IV ([place])    Cr as of August 2021 1.92  . Non-ST elevation (NSTEMI) myocardial infarction ([place]) 11/2009; 01/2020   a) 9/'11: Cath - 100% D1 --> DES PCI Promus 2.25 x 23 (2.3  mm).  EF 55% with anterolateral HK. -> patent stent in Sept 2012 & 2015; b) 02/05/2020: NSTEMI - Echo EF >55% no RWMA - 80% OM3 (DES PCI), 70% D1 ISR, 60-65% mRCA.  . Obesity (BMI 30.0-34.9)   . OSA on CPAP    uses cpap  . Vertigo     PAST SURGICAL HISTORY   Past Surgical History:  Procedure Laterality Date  . CORONARY STENT INTERVENTION  02/06/2020   [facility] [place], [place] -> Dr. Gretta Cool): NSTEMI: OM3  80% -> DES PCI: Synergy DES 2.5 x 20 - > 2.6 mm  . CORONARY STENT PLACEMENT  11/15/2009   (Dr. Burt Knack) -- DES PCI of 100% D1 - Promus DES 2.25 x 23 --> 2.3 mm  . HERNIA REPAIR     hernia repair at birth  . LEFT HEART CATH AND CORONARY ANGIOGRAPHY  11/15/2009   (Dr. Burt Knack): NSTEMI -- LM-< LAD & LCx. ~mLAD ~30-40% just past D1 (D1 - 100% - CULPRIT - PCI), LCx normal - 2 OM & LPL. Dom RCA with Large PDA (small PL), ~30% mRCA.  EF ~55% with Ant-Lat HK   . LEFT HEART CATH AND CORONARY ANGIOGRAPHY  11/22/2010   (Dr. Wynonia Lawman): Patent Diag 1 stent with minimal CAD elsewhere.  EF ~55-60% with Anterolateral HK  . LEFT HEART CATH AND CORONARY ANGIOGRAPHY  12/31/2013   [facility] - for Abnormal Myoview) --> Non-obstructive CAD with widely patent D1 stent.  LVEDP ~16 mmHg  . LEFT HEART CATH AND CORONARY ANGIOGRAPHY  02/06/2020   [place], [place] -> Dr. Elsie Amis): NSTEMI: D1 70% mid stent ISR; OM3 80% (-> DES PCI), mRCA 60-65%.    Marland Kitchen NASAL SINUS SURGERY  2010   For sleep apnea  . NM MYOVIEW LTD  12/05/2019   EF> 65%.  Fixed anterior perfusion defect with normal motion suggesting artifact.  Read as LOW RISK -> but NOT NORMAL  . TM MYOVIEW  12/2013   (FALSE POSITIVE) + TM - lateral ST depressions (also noted with recovery - near syncope) -> Reversible Anterior Wall Perfusion defect - CRO Ischemia; Partialy reversible Inferior defect - ~ diaphragmatic attenuation. EF ~48%. --> Referred for CATH --> FALSE POSTIIVE  . TRANSTHORACIC ECHOCARDIOGRAM  07/08/2014    EF 60%. No RWMA. Mild LA dilation. Normal walves.  . TRANSTHORACIC ECHOCARDIOGRAM  02/05/2020   [facility] [place], [place] -> Dr. Elsie Amis): NSTEMI: EF> 55%. No R WMA. Normal valves.    TTE 02/05/20: EF> 55%. No R WMA. Normal valves.  CARDIAC CATH -PCI 02/06/2020: Severe stenosis of LCx OM 3-DES PCI (discharged on aspirin Brilinta) ? D1-mid in-stent 70% restenosis; LCx-OM 3 proximal 80%, RCA mid 60 -65% => PCI OM 3-Synergy DES 2.5 mm x 20 mm (3.6 mm   Immunization History  Administered Date(s) Administered  . Moderna Sars-Covid-2 Vaccination 05/23/2019, 06/20/2019    MEDICATIONS/ALLERGIES   Current Meds  Medication Sig  . amLODipine (NORVASC) 10 MG tablet Take 10 mg by mouth daily.  Marland Kitchen aspirin 81 MG chewable tablet   . Azelastine HCl 137 MCG/SPRAY SOLN Place 2 sprays into both nostrils in the morning and at bedtime.  . cholecalciferol (VITAMIN D3) 25 MCG (1000 UNIT) tablet Take 2,000 Units by mouth daily.  . famotidine (PEPCID) 20 MG tablet Take 20 mg by mouth at bedtime as needed.  . hydrALAZINE (APRESOLINE) 100 MG tablet Take 1 tablet (100 mg total) by mouth 2 (two) times daily. May take an additional tablet if  Systolic blood pressure is greater than 150.  . isosorbide mononitrate (IMDUR) 30 MG 24 hr tablet Take 1 tablet (30 mg total) by mouth daily.  . metoprolol succinate (TOPROL-XL) 25 MG 24 hr tablet Take 1 tablet by mouth daily. Take 1/2 Tablet Daily  . montelukast (SINGULAIR) 10 MG tablet Take 10 mg by mouth daily.  . nitroGLYCERIN (NITROSTAT) 0.4 MG SL tablet Place 1 tablet (0.4 mg total) under the tongue every 5 (five) minutes as needed for chest pain.  Marland Kitchen ondansetron (  ZOFRAN) 4 MG tablet Take 1 tablet (4 mg total) by mouth every 8 (eight) hours as needed for nausea or vomiting.  . pravastatin (PRAVACHOL) 40 MG tablet Take 1 tablet (40 mg total) by mouth every evening.  Marland Kitchen PROAIR RESPICLICK 341 (90 Base) MCG/ACT AEPB Inhale into the lungs.  . ticagrelor  (BRILINTA) 90 MG TABS tablet Take 1 tablet (90 mg total) by mouth 2 (two) times daily.    Allergies  Allergen Reactions  . Niacin And Related Itching  . Zocor [Simvastatin-High Dose]     Muscle aches  . Zocor [Simvastatin] Other (See Comments)    SOCIAL HISTORY/FAMILY HISTORY   Reviewed in Epic:  Pertinent findings:  Social History   Tobacco Use  . Smoking status: Never Smoker  . Smokeless tobacco: Never Used  Substance Use Topics  . Alcohol use: Yes    Alcohol/week: 1.0 standard drink    Types: 1 Standard drinks or equivalent per week    Comment: Rare, Occasional glass of wine  . Drug use: No   Social History   Social History Narrative   He is a married father of 2 girls.   Public relations account executive   Lifelong non-smoker.   Rare glass of wine   Minimal caffeine intake, no longer drinks coffee only drinks intermittent sweet tea.  Rarely would have a Sprite.      Walks daily at work, does not get routine exercise.    OBJCTIVE -PE, EKG, labs   Wt Readings from Last 3 Encounters:  05/17/20 243 lb 3.2 oz (110.3 kg)  02/24/20 251 lb (113.9 kg)  01/23/20 254 lb 3.2 oz (115.3 kg)    Physical Exam: BP 140/82   Pulse (!) 57   Ht 5\' 11"  (1.803 m)   Wt 243 lb 3.2 oz (110.3 kg)   SpO2 94%   BMI 33.92 kg/m  Physical Exam Vitals reviewed.  Constitutional:      General: He is not in acute distress.    Appearance: Normal appearance. He is obese. He is not ill-appearing.  HENT:     Head: Normocephalic and atraumatic.  Neck:     Vascular: No carotid bruit, hepatojugular reflux or JVD.  Cardiovascular:     Rate and Rhythm: Normal rate and regular rhythm.  No extrasystoles are present.    Pulses: Normal pulses.     Heart sounds: S1 normal and S2 normal. Heart sounds are distant. No murmur heard. No friction rub. No gallop.   Pulmonary:     Effort: Pulmonary effort is normal. No respiratory distress.     Breath sounds: Normal breath sounds.   Chest:     Chest wall: No tenderness.  Musculoskeletal:        General: Swelling (Trivial R>L) present. Normal range of motion.     Cervical back: Normal range of motion and neck supple.  Skin:    General: Skin is warm and dry.  Neurological:     General: No focal deficit present.     Mental Status: He is alert and oriented to person, place, and time. Mental status is at baseline.     Gait: Gait normal.     Adult ECG Report  Rate: 57 ;  Rhythm: sinus bradycardia; T wave inversions.  Cannot exclude inferior-lateral ischemia.  Narrative Interpretation: Rate is slower.  Inferior T wave inversions now present.   Recent Labs:    04/29/2020:  Na+ 146, K+ 4.4, Cl- 111, HCO3-20, BUN 20, Cr 2.3, Glu 99, Ca2+  9.0; AST    Ref Range & Units 02/06/20  Triglycerides <150 mg/dL 70   Cholesterol <200 mg/dL 127   HDL >40 mg/dL 51   LDL Direct <=100 mg/dL 68   Resulting Agency  [facility]  Specimen Collected: 02/06/20 04:51 Last Resulted: 02/06/20 05:40  Received From: [facility]  Result Received: 02/17/20 13:24    Lab Results  Component Value Date   CREATININE 2.27 (H) 03/27/2020   BUN 18 03/27/2020   NA 140 03/27/2020   K 3.8 03/27/2020   CL 108 03/27/2020   CO2 22 03/27/2020   CBC Latest Ref Rng & Units 03/27/2020 07/14/2014 07/13/2014  WBC 4.0 - 10.5 K/uL 16.2(H) 15.0(H) 16.1(H)  Hemoglobin 13.0 - 17.0 g/dL 13.2 9.1(L) 9.6(L)  Hematocrit 39.0 - 52.0 % 36.6(L) 25.2(L) 26.2(L)  Platelets 150 - 400 K/uL 304 410(H) 407(H)    Lab Results  Component Value Date   TSH 0.259 (L) 07/07/2014    ==================================================  COVID-19 Education: The signs and symptoms of COVID-19 were discussed with the patient and how to seek care for testing (follow up with PCP or arrange E-visit).   The importance of social distancing and COVID-19 vaccination was discussed today. The patient is practicing social distancing & Masking.   I spent a total of 45  minutes with the patient spent in direct patient consultation.  Additional time spent with chart review  / charting (studies, outside notes, etc): 17 min Total Time: 62 min   Current medicines are reviewed at length with the patient today.  (+/- concerns) n/a  This visit occurred during the SARS-CoV-2 public health emergency.  Safety protocols were in place, including screening questions prior to the visit, additional usage of staff PPE, and extensive cleaning of exam room while observing appropriate contact time as indicated for disinfecting solutions.  Notice: This dictation was prepared with Dragon dictation along with smaller phrase technology. Any transcriptional errors that result from this process are unintentional and may not be corrected upon review.  Patient Instructions / Medication Changes & Studies & Tests Ordered   Patient Instructions  Medication Instructions:  Change An take medication in the evening /bedtime-- Pravastatin , Metoprolol    Take your Aspirin 81 mg - 30 min  Before Isosorbide at night  *If you need a refill on your cardiac medications before your next appointment, please call your pharmacy*   Lab Work:  Not needed   Testing/Procedures:  Not needed  Follow-Up: At [facility], you and your health needs are our priority.  As part of our continuing mission to provide you with exceptional heart care, we have created designated Provider Care Teams.  These Care Teams include your primary Cardiologist (physician) and Advanced Practice Providers (APPs -  Physician Assistants and Nurse Practitioners) who all work together to provide you with the care you need, when you need it.     Your next appointment:   5 month(s)  The format for your next appointment:   In Person  Provider:   Glenetta Hew, MD    Studies Ordered:   Orders Placed This Encounter  Procedures  . EKG 12-Lead     Glenetta Hew, M.D., M.S. Interventional Cardiologist    Pager # [phone] Phone # [phone] [address]. [place], [place] [address]   Thank you for choosing Heartcare at [facility]!!

## 2020-05-17 NOTE — Patient Instructions (Signed)
Medication Instructions:  Change An take medication in the evening /bedtime-- Pravastatin , Metoprolol    Take your Aspirin 81 mg - 30 min  Before Isosorbide at night  *If you need a refill on your cardiac medications before your next appointment, please call your pharmacy*   Lab Work:  Not needed   Testing/Procedures:  Not needed  Follow-Up: At [facility], you and your health needs are our priority.  As part of our continuing mission to provide you with exceptional heart care, we have created designated Provider Care Teams.  These Care Teams include your primary Cardiologist (physician) and Advanced Practice Providers (APPs -  Physician Assistants and Nurse Practitioners) who all work together to provide you with the care you need, when you need it.     Your next appointment:   5 month(s)  The format for your next appointment:   In Person  Provider:   Glenetta Hew, MD

## 2020-06-10 ENCOUNTER — Encounter: Payer: Self-pay | Admitting: Cardiology

## 2020-06-10 NOTE — Progress Notes (Incomplete)
Primary Care Provider: Najae Small, MD Cardiologist: George Hew, MD  -> Former patient of Dr. Wynonia Erickson Electrophysiologist: None  Clinic Note: Chief Complaint  Patient presents with  . Follow-up    Doing fairly well.  Had [place] in January  . Coronary Artery Disease    No further angina.  . Dizziness    , Fatigue also cold/numb hands.    ===================================  ASSESSMENT/PLAN   Problem List Items Addressed This Visit    Non-ST elevation (NSTEMI) myocardial infarction ([place])   Relevant Orders   EKG 12-Lead (Completed)   CAD S/P percutaneous coronary angioplasty - Primary (Chronic)   Relevant Orders   EKG 12-Lead (Completed)   Hyperlipidemia with target LDL less than 70 (Chronic)   Essential hypertension (Chronic)      ===================================  HPI:    George Erickson is a 50 y.o. male with a PMH notable for history of MI (age 15 (PCI-diagonal) and recent non-STEMI (November 2021: PCI LCx-OM - DES x 3; while visiting family in [place]), CRFs of HTN and HLD along with CKD-3, mild Obesity, and OSA-CPAP who presents today for 29-month follow-up.  George Erickson was last seen on February 24, 2020 as hospital follow-up from his non-STEMI while in [place].  I had actually seen him to reestablish cardiology care at the request of George Small, MD back in September 2021.  We have done on Myoview stress test that was clearly abnormal but read as low risk.  We discussed options and had planned cardiac catheterization with possible PCI in early December 2021.  However he ended up Presenting to [facility] on November 24th with a non-STEMI. => At this first follow-up visit, he noted that his chest pain symptoms is all gone away.  His PCP was evaluating for possible DVT/PE with VQ scan and Dopplers that were negative.  He notes some mild off-and-on exertional dyspnea but no PDA, orthopnea with only unilateral edema.  He was having some  dyspnea issues associated Brilinta.  For some reason his ARB has been discontinued.  Still noting occasional palpitations with some dizziness.  Also has baseline anxiety with mild panic attacks..  Recommended taking Brilinta with caffeine.  I added Imdur which was supposed to take it at night.  I increase hydralazine 100 g 3 times daily-actually taking twice daily with the additional dose for SBP greater than 150 mmHg.  Recent Hospitalizations:  George Erickson, ER 03/27/2020: Presentation with palpitations getting excessively p.m. he also noted headaches nasal congestion] cough.  Several members of his family had tested positive for COVID-19.  He was fully vaccinated, but he himself also tested positive for Covid.  His heart rate was in the 115 bpm range.  He was actually given IV diltiazem to slow his heart rate down.  Telephone consult with cardiology suggested that the heart rate was fast because of his ongoing infection.  Recommended hydration and medical management.   Reviewed  CV studies:    The following studies were reviewed today: (if available, images/films reviewed: From Epic Chart or Care Everywhere) . Lower extremity venous Dopplers 02/19/2020: No DVT or superficial venous thrombosis.   Interval History:   George Erickson returns today overall doing pretty well.  He is recovering from his Covid infection.  He had a minor course.  No major issues.  He says that he still has occasional anxiety episodes were associated with headaches and palpitations, but pretty well controlled.  No further chest pain or pressure  with rest or exertion. He has headache and occasional lightheadedness and wooziness off and on.  Gets a little tired, started into the day.  He also describes feeling hands getting cold and numb. Actually denies any PND, orthopnea or edema.  No prolonged tachycardia.  CV Review of Symptoms (Summary): no chest pain or dyspnea on exertion positive for - Mild swelling.  Short bursts  of palpitations but nothing prolonged.  Some lightheadedness and dizziness very rarely feels near syncopal.   negative for - loss of consciousness, orthopnea, paroxysmal nocturnal dyspnea, shortness of breath or TIA/amaurosis fugax, claudication   CV Review of Symptoms (Summary): positive for - dyspnea on exertion, palpitations and No longer noting the prolonged last heart rates. negative for - chest pain, edema, orthopnea, paroxysmal nocturnal dyspnea, rapid heart rate, shortness of breath or Not really having any more lightheadedness or dizziness, no syncope or near syncope, no TIA or amaurosis fugax symptoms. No claudication.  The patient does not have symptoms concerning for COVID-19 infection (fever, chills, cough, or new shortness of breath).   REVIEWED OF SYSTEMS   Review of Systems  Constitutional: Positive for malaise/fatigue (Still feels little fatigued.) and weight loss (He has lost about 10 pounds or so.).  HENT: Negative for nosebleeds.   Respiratory: Positive for shortness of breath (Only exertional, now better somewhat).   Gastrointestinal: Negative for blood in stool and melena.  Genitourinary: Negative for dysuria, frequency and hematuria.  Musculoskeletal: Negative for back pain, joint pain and myalgias.       Just feels somewhat weak.  Neurological: Positive for dizziness (Lightheadedness and dizziness off and on also somewhat associate with fatigue.) and headaches. Negative for seizures and loss of consciousness.  Psychiatric/Behavioral: Negative for memory loss. The patient is nervous/anxious (Occasional anxiety episodes associate with headache and palpitations.). The patient does not have insomnia.    I have reviewed and (if needed) personally updated the patient's problem list, medications, allergies, past medical and surgical history, social and family history.   PAST MEDICAL HISTORY   Past Medical History:  Diagnosis Date  . Allergic rhinitis due to pollen   .  Arthritis    lower back  . CAD S/P DES PCI D1 11/15/2009   a) NSTEMI - 100% D1 (DES PCI - Promus DES 2.25 x 23 -- 2.3 mm); otherwise minimal CAD in a co-dominant system;; b) 02/06/20: OM3 80% (DES PCI), D1 70% ISR, mRCA 60-65%.   . Chronic left shoulder pain    Impingement syndrome  . Erectile dysfunction   . Gout   . Hyperlipidemia with target LDL less than 70    Associated CAD  . Hypertension   . Hypertensive kidney disease with CKD stage IV ([place])    Cr as of August 2021 1.92  . Non-ST elevation (NSTEMI) myocardial infarction ([place]) 11/2009; 01/2020   a) 9/'11: Cath - 100% D1 --> DES PCI Promus 2.25 x 23 (2.3 mm).  EF 55% with anterolateral HK. -> patent stent in Sept 2012 & 2015; b) 02/05/2020: NSTEMI - Echo EF >55% no RWMA - 80% OM3 (DES PCI), 70% D1 ISR, 60-65% mRCA.  . Obesity (BMI 30.0-34.9)   . OSA on CPAP    uses cpap  . Vertigo     PAST SURGICAL HISTORY   Past Surgical History:  Procedure Laterality Date  . CORONARY STENT INTERVENTION  02/06/2020   [facility] [place], [place] -> Dr. Gretta Cool): NSTEMI: OM3 80% -> DES PCI: Synergy DES 2.5 x 20 - >  2.6 mm  . CORONARY STENT PLACEMENT  11/15/2009   (Dr. Burt Knack) -- DES PCI of 100% D1 - Promus DES 2.25 x 23 --> 2.3 mm  . HERNIA REPAIR     hernia repair at birth  . LEFT HEART CATH AND CORONARY ANGIOGRAPHY  11/15/2009   (Dr. Burt Knack): NSTEMI -- LM-< LAD & LCx. ~mLAD ~30-40% just past D1 (D1 - 100% - CULPRIT - PCI), LCx normal - 2 OM & LPL. Dom RCA with Large PDA ([address]), ~30% mRCA.  EF ~55% with Ant-Lat HK   . LEFT HEART CATH AND CORONARY ANGIOGRAPHY  11/22/2010   (Dr. Wynonia Erickson): Patent Diag 1 stent with minimal CAD elsewhere.  EF ~55-60% with Anterolateral HK  . LEFT HEART CATH AND CORONARY ANGIOGRAPHY  12/31/2013   [facility] - for Abnormal Myoview) --> Non-obstructive CAD with widely patent D1 stent.  LVEDP ~16 mmHg  . LEFT HEART CATH AND CORONARY ANGIOGRAPHY  02/06/2020   [place],  [place] -> Dr. Elsie Amis): NSTEMI: D1 70% mid stent ISR; OM3 80% (-> DES PCI), mRCA 60-65%.    Marland Kitchen NASAL SINUS SURGERY  2010   For sleep apnea  . NM MYOVIEW LTD  12/05/2019   EF> 65%.  Fixed anterior perfusion defect with normal motion suggesting artifact.  Read as LOW RISK -> but NOT NORMAL  . TM MYOVIEW  12/2013   (FALSE POSITIVE) + TM - lateral ST depressions (also noted with recovery - near syncope) -> Reversible Anterior Wall Perfusion defect - CRO Ischemia; Partialy reversible Inferior defect - ~ diaphragmatic attenuation. EF ~48%. --> Referred for CATH --> FALSE POSTIIVE  . TRANSTHORACIC ECHOCARDIOGRAM  07/08/2014   EF 60%. No RWMA. Mild LA dilation. Normal walves.  . TRANSTHORACIC ECHOCARDIOGRAM  02/05/2020   [facility] [place], [place] -> Dr. Elsie Amis): NSTEMI: EF> 55%. No R WMA. Normal valves.    TTE 02/05/20: EF> 55%. No R WMA. Normal valves.  CARDIAC CATH -PCI 02/06/2020: Severe stenosis of LCx OM 3-DES PCI (discharged on aspirin Brilinta) ? D1-mid in-stent 70% restenosis; LCx-OM 3 proximal 80%, RCA mid 60 -65% => PCI OM 3-Synergy DES 2.5 mm x 20 mm (3.6 mm   Immunization History  Administered Date(s) Administered  . Moderna Sars-Covid-2 Vaccination 05/23/2019, 06/20/2019    MEDICATIONS/ALLERGIES   Current Meds  Medication Sig  . amLODipine (NORVASC) 10 MG tablet Take 10 mg by mouth daily.  Marland Kitchen aspirin 81 MG chewable tablet   . Azelastine HCl 137 MCG/SPRAY SOLN Place 2 sprays into both nostrils in the morning and at bedtime.  . cholecalciferol (VITAMIN D3) 25 MCG (1000 UNIT) tablet Take 2,000 Units by mouth daily.  . famotidine (PEPCID) 20 MG tablet Take 20 mg by mouth at bedtime as needed.  . hydrALAZINE (APRESOLINE) 100 MG tablet Take 1 tablet (100 mg total) by mouth 2 (two) times daily. May take an additional tablet if  Systolic blood pressure is greater than 150.  . isosorbide mononitrate (IMDUR) 30 MG 24 hr tablet Take 1 tablet (30 mg total) by mouth  daily.  . metoprolol succinate (TOPROL-XL) 25 MG 24 hr tablet Take 1 tablet by mouth daily. Take 1/2 Tablet Daily  . montelukast (SINGULAIR) 10 MG tablet Take 10 mg by mouth daily.  . nitroGLYCERIN (NITROSTAT) 0.4 MG SL tablet Place 1 tablet (0.4 mg total) under the tongue every 5 (five) minutes as needed for chest pain.  Marland Kitchen ondansetron (ZOFRAN) 4 MG tablet Take 1 tablet (4 mg total)  by mouth every 8 (eight) hours as needed for nausea or vomiting.  . pravastatin (PRAVACHOL) 40 MG tablet Take 1 tablet (40 mg total) by mouth every evening.  Marland Kitchen PROAIR RESPICLICK 665 (90 Base) MCG/ACT AEPB Inhale into the lungs.  . ticagrelor (BRILINTA) 90 MG TABS tablet Take 1 tablet (90 mg total) by mouth 2 (two) times daily.    Allergies  Allergen Reactions  . Niacin And Related Itching  . Zocor [Simvastatin-High Dose]     Muscle aches  . Zocor [Simvastatin] Other (See Comments)    SOCIAL HISTORY/FAMILY HISTORY   Reviewed in Epic:  Pertinent findings:  Social History   Tobacco Use  . Smoking status: Never Smoker  . Smokeless tobacco: Never Used  Substance Use Topics  . Alcohol use: Yes    Alcohol/week: 1.0 standard drink    Types: 1 Standard drinks or equivalent per week    Comment: Rare, Occasional glass of wine  . Drug use: No   Social History   Social History Narrative   He is a married father of 2 girls.   Public relations account executive   Lifelong non-smoker.   Rare glass of wine   Minimal caffeine intake, no longer drinks coffee only drinks intermittent sweet tea.  Rarely would have a Sprite.      Walks daily at work, does not get routine exercise.    OBJCTIVE -PE, EKG, labs   Wt Readings from Last 3 Encounters:  05/17/20 243 lb 3.2 oz (110.3 kg)  02/24/20 251 lb (113.9 kg)  01/23/20 254 lb 3.2 oz (115.3 kg)    Physical Exam: BP 140/82   Pulse (!) 57   Ht 5\' 11"  (1.803 m)   Wt 243 lb 3.2 oz (110.3 kg)   SpO2 94%   BMI 33.92 kg/m  Physical Exam Vitals  reviewed.  Constitutional:      General: He is not in acute distress.    Appearance: Normal appearance. He is obese. He is not ill-appearing.  HENT:     Head: Normocephalic and atraumatic.  Neck:     Vascular: No carotid bruit, hepatojugular reflux or JVD.  Cardiovascular:     Rate and Rhythm: Normal rate and regular rhythm.  No extrasystoles are present.    Pulses: Normal pulses.     Heart sounds: S1 normal and S2 normal. Heart sounds are distant. No murmur heard. No friction rub. No gallop.   Pulmonary:     Effort: Pulmonary effort is normal. No respiratory distress.     Breath sounds: Normal breath sounds.  Chest:     Chest wall: No tenderness.  Musculoskeletal:        General: Swelling (Trivial R>L) present. Normal range of motion.     Cervical back: Normal range of motion and neck supple.  Skin:    General: Skin is warm and dry.  Neurological:     General: No focal deficit present.     Mental Status: He is alert and oriented to person, place, and time. Mental status is at baseline.     Gait: Gait normal.     Adult ECG Report  Rate: 57 ;  Rhythm: sinus bradycardia; T wave inversions.  Cannot exclude inferior-lateral ischemia.  Narrative Interpretation: Rate is slower.  Inferior T wave inversions now present.   Recent Labs:    04/29/2020:  Na+ 146, K+ 4.4, Cl- 111, HCO3-20, BUN 20, Cr 2.3, Glu 99, Ca2+ 9.0; AST    Ref Range & Units 02/06/20  Triglycerides <150 mg/dL 70   Cholesterol <200 mg/dL 127   HDL >40 mg/dL 51   LDL Direct <=100 mg/dL 68   Resulting Agency  [facility]  Specimen Collected: 02/06/20 04:51 Last Resulted: 02/06/20 05:40  Received From: [facility]  Result Received: 02/17/20 13:24    Lab Results  Component Value Date   CREATININE 2.27 (H) 03/27/2020   BUN 18 03/27/2020   NA 140 03/27/2020   K 3.8 03/27/2020   CL 108 03/27/2020   CO2 22 03/27/2020   CBC Latest Ref Rng & Units 03/27/2020 07/14/2014 07/13/2014  WBC 4.0 - 10.5  K/uL 16.2(H) 15.0(H) 16.1(H)  Hemoglobin 13.0 - 17.0 g/dL 13.2 9.1(L) 9.6(L)  Hematocrit 39.0 - 52.0 % 36.6(L) 25.2(L) 26.2(L)  Platelets 150 - 400 K/uL 304 410(H) 407(H)    Lab Results  Component Value Date   TSH 0.259 (L) 07/07/2014    ==================================================  COVID-19 Education: The signs and symptoms of COVID-19 were discussed with the patient and how to seek care for testing (follow up with PCP or arrange E-visit).   The importance of social distancing and COVID-19 vaccination was discussed today. The patient is practicing social distancing & Masking.   I spent a total of 45 minutes with the patient spent in direct patient consultation.  Additional time spent with chart review  / charting (studies, outside notes, etc): 17 min Total Time: 62 min   Current medicines are reviewed at length with the patient today.  (+/- concerns) n/a  This visit occurred during the SARS-CoV-2 public health emergency.  Safety protocols were in place, including screening questions prior to the visit, additional usage of staff PPE, and extensive cleaning of exam room while observing appropriate contact time as indicated for disinfecting solutions.  Notice: This dictation was prepared with Dragon dictation along with smaller phrase technology. Any transcriptional errors that result from this process are unintentional and may not be corrected upon review.  Patient Instructions / Medication Changes & Studies & Tests Ordered   Patient Instructions  Medication Instructions:  Change An take medication in the evening /bedtime-- Pravastatin , Metoprolol    Take your Aspirin 81 mg - 30 min  Before Isosorbide at night  *If you need a refill on your cardiac medications before your next appointment, please call your pharmacy*   Lab Work:  Not needed   Testing/Procedures:  Not needed  Follow-Up: At [facility], you and your health needs are our priority.  As part of  our continuing mission to provide you with exceptional heart care, we have created designated Provider Care Teams.  These Care Teams include your primary Cardiologist (physician) and Advanced Practice Providers (APPs -  Physician Assistants and Nurse Practitioners) who all work together to provide you with the care you need, when you need it.     Your next appointment:   5 month(s)  The format for your next appointment:   In Person  Provider:   Glenetta Hew, MD       Studies Ordered:   Orders Placed This Encounter  Procedures  . EKG 12-Lead     George Erickson, M.D., M.S. Interventional Cardiologist   Pager # [phone] Phone # [phone] [address] [place], [place] [address]   Thank you for choosing Heartcare at [facility]!!

## 2020-06-10 NOTE — Assessment & Plan Note (Signed)
Has had stents to the diagonal with in-stent restenosis noted, also moderate disease in the RCA with now 3 new stents to the circumflex-Owen.  Is on a relatively stable regimen: Antianginal:  Toprol 12.5 mg-switch to p.m.  Amlodipine 10 mg q. A.m.  Imdur 30 mg switched to p.m. (take 30 minutes after aspirin)   Currently on pravastatin 40 mg-recommend take p.m.  On DAPT

## 2020-06-10 NOTE — Assessment & Plan Note (Addendum)
Status post PCI to the circumflex and OM with 3 stents overlapping.  He also has existing disease in the RCA estimated 60 to 65% and 70% in-stent restenosis of diagonal.  No further anginal pain, but would like to continue DAPT since his presentation was non-STEMI.  Plan:   Continue aspirin plus Brilinta.  Less dyspnea with Brilinta.  Plan is DAPT through November of this year. ->  Okay to hold aspirin for significant bruising.  As of December 2022, will either reduce Brilinta to 60 mg twice daily or convert to Plavix.  At that time can be interrupted 5 to 7 days preop for surgeries.  Continue Imdur but switch to nighttime, 30 minutes after aspirin.  Continue Toprol also switch to nighttime.  Continue amlodipine in the morning.  Continue statin, but change dose to p.m.

## 2020-06-11 NOTE — Assessment & Plan Note (Signed)
Blood pressure still little high.  Being followed by nephrology, but no major changes.  Plan:   Continue with plan of using hydralazine 1 mg twice daily with a third dose as needed for SBP greater than 150.  Continue amlodipine 10 mg daily along with Toprol 25 mg.-Amlodipine is in the a.m. and Toprol will switch to p.m.  Is also on Imdur which will also be taken p.m.  Not a lot of room to titrate doses any further, his resting heart rate is 57 so we cannot really increase the Toprol further--> we could potentially convert to carvedilol which is more for blood pressure than heart rate.

## 2020-06-11 NOTE — Assessment & Plan Note (Signed)
Very difficult to treat in the setting of antihypertensives.

## 2020-06-11 NOTE — Assessment & Plan Note (Signed)
Most recent creatinine was 2.3.  This seems to be his new baseline.  Defer management to nephrologist. Unfortunately, not currently on ACE inhibitor or ARB because of renal insufficiency.

## 2020-06-11 NOTE — Assessment & Plan Note (Signed)
He still has palpitations every so often, but not worrisome.

## 2020-06-11 NOTE — Assessment & Plan Note (Signed)
Very distant history of lateral MI with PCI of the diagonal branch.  60% ISR of that stent. Evidence of infarct noted on Myoview with peri-infarct ischemia. => If he has recurrent symptoms, would probably need treatment of the in-stent restenosis.

## 2020-06-11 NOTE — Assessment & Plan Note (Signed)
Most recent LDL as of November 2021 was 868 on pravastatin.  He should be having labs checked soon.  He thinks he may have had them checked, we will ask him to check with his PCP to see if any labs are available.  He is tolerating pravastatin well, but if not at goal, would potentially anticipate converting to rosuvastatin.

## 2020-06-11 NOTE — Assessment & Plan Note (Signed)
Non-STEMI in November 2021: Culprit lesion was found to be LCx-OM.  3 DES stents placed. Overall, he seems doing pretty well.  Echo was relatively normal.

## 2020-08-01 ENCOUNTER — Other Ambulatory Visit: Payer: Self-pay | Admitting: Cardiology

## 2020-08-02 NOTE — Telephone Encounter (Signed)
Rx(s) sent to pharmacy electronically.  

## 2020-08-04 DIAGNOSIS — G4733 Obstructive sleep apnea (adult) (pediatric): Secondary | ICD-10-CM | POA: Diagnosis not present

## 2020-08-06 ENCOUNTER — Other Ambulatory Visit: Payer: Self-pay

## 2020-08-06 ENCOUNTER — Other Ambulatory Visit: Payer: Self-pay | Admitting: Family Medicine

## 2020-08-06 ENCOUNTER — Ambulatory Visit (HOSPITAL_COMMUNITY)
Admission: RE | Admit: 2020-08-06 | Discharge: 2020-08-06 | Disposition: A | Discharge status: Home / Self Care | Payer: BC Managed Care – PPO | Source: Ambulatory Visit | Attending: Family Medicine | Admitting: Family Medicine

## 2020-08-06 ENCOUNTER — Other Ambulatory Visit (HOSPITAL_COMMUNITY): Payer: Self-pay | Admitting: Family Medicine

## 2020-08-06 DIAGNOSIS — M5127 Other intervertebral disc displacement, lumbosacral region: Secondary | ICD-10-CM | POA: Diagnosis not present

## 2020-08-06 DIAGNOSIS — M5126 Other intervertebral disc displacement, lumbar region: Secondary | ICD-10-CM

## 2020-08-06 DIAGNOSIS — M47817 Spondylosis without myelopathy or radiculopathy, lumbosacral region: Secondary | ICD-10-CM | POA: Diagnosis not present

## 2020-08-06 DIAGNOSIS — R292 Abnormal reflex: Secondary | ICD-10-CM | POA: Diagnosis not present

## 2020-08-06 DIAGNOSIS — M4807 Spinal stenosis, lumbosacral region: Secondary | ICD-10-CM | POA: Diagnosis not present

## 2020-08-16 DIAGNOSIS — M5442 Lumbago with sciatica, left side: Secondary | ICD-10-CM | POA: Diagnosis not present

## 2020-08-16 DIAGNOSIS — I25110 Atherosclerotic heart disease of native coronary artery with unstable angina pectoris: Secondary | ICD-10-CM | POA: Diagnosis not present

## 2020-08-16 DIAGNOSIS — I129 Hypertensive chronic kidney disease with stage 1 through stage 4 chronic kidney disease, or unspecified chronic kidney disease: Secondary | ICD-10-CM | POA: Insufficient documentation

## 2020-08-16 DIAGNOSIS — N183 Chronic kidney disease, stage 3 unspecified: Secondary | ICD-10-CM | POA: Insufficient documentation

## 2020-08-16 DIAGNOSIS — G8929 Other chronic pain: Secondary | ICD-10-CM | POA: Insufficient documentation

## 2020-08-16 DIAGNOSIS — M5126 Other intervertebral disc displacement, lumbar region: Secondary | ICD-10-CM | POA: Insufficient documentation

## 2020-08-16 DIAGNOSIS — Z79899 Other long term (current) drug therapy: Secondary | ICD-10-CM | POA: Insufficient documentation

## 2020-08-16 DIAGNOSIS — M545 Low back pain, unspecified: Secondary | ICD-10-CM | POA: Diagnosis not present

## 2020-08-16 DIAGNOSIS — Z7982 Long term (current) use of aspirin: Secondary | ICD-10-CM | POA: Insufficient documentation

## 2020-08-17 ENCOUNTER — Emergency Department (HOSPITAL_COMMUNITY)
Admission: EM | Admit: 2020-08-17 | Discharge: 2020-08-17 | Disposition: A | Discharge status: Home / Self Care | Payer: BC Managed Care – PPO | Attending: Emergency Medicine | Admitting: Emergency Medicine

## 2020-08-17 ENCOUNTER — Encounter (HOSPITAL_COMMUNITY): Payer: Self-pay | Admitting: Emergency Medicine

## 2020-08-17 ENCOUNTER — Other Ambulatory Visit: Payer: Self-pay

## 2020-08-17 DIAGNOSIS — G8929 Other chronic pain: Secondary | ICD-10-CM

## 2020-08-17 DIAGNOSIS — M5126 Other intervertebral disc displacement, lumbar region: Secondary | ICD-10-CM | POA: Diagnosis not present

## 2020-08-17 DIAGNOSIS — M5136 Other intervertebral disc degeneration, lumbar region: Secondary | ICD-10-CM

## 2020-08-17 LAB — BASIC METABOLIC PANEL
Anion gap: 7 (ref 5–15)
BUN: 25 mg/dL — ABNORMAL HIGH (ref 6–20)
CO2: 25 mmol/L (ref 22–32)
Calcium: 8.8 mg/dL — ABNORMAL LOW (ref 8.9–10.3)
Chloride: 107 mmol/L (ref 98–111)
Creatinine, Ser: 2.2 mg/dL — ABNORMAL HIGH (ref 0.61–1.24)
GFR, Estimated: 36 mL/min — ABNORMAL LOW (ref >60)
Glucose, Bld: 109 mg/dL — ABNORMAL HIGH (ref 70–99)
Potassium: 4 mmol/L (ref 3.5–5.1)
Sodium: 139 mmol/L (ref 135–145)

## 2020-08-17 LAB — CBC
HCT: 29.7 % — ABNORMAL LOW (ref 39.0–52.0)
Hemoglobin: 10.7 g/dL — ABNORMAL LOW (ref 13.0–17.0)
MCH: 30.5 pg (ref 26.0–34.0)
MCHC: 36 g/dL (ref 30.0–36.0)
MCV: 84.6 fL (ref 80.0–100.0)
Platelets: 283 10*3/uL (ref 150–400)
RBC: 3.51 MIL/uL — ABNORMAL LOW (ref 4.22–5.81)
RDW: 14.5 % (ref 11.5–15.5)
WBC: 18.7 10*3/uL — ABNORMAL HIGH (ref 4.0–10.5)
nRBC: 0.4 % — ABNORMAL HIGH (ref 0.0–0.2)

## 2020-08-17 MED ORDER — OXYCODONE-ACETAMINOPHEN 5-325 MG PO TABS
1.0000 | ORAL_TABLET | Freq: Once | ORAL | Status: AC
  Administered 2020-08-17: 1 via ORAL
  Filled 2020-08-17: qty 1

## 2020-08-17 MED ORDER — HYDROCODONE-ACETAMINOPHEN 5-325 MG PO TABS: 1.0000 | ORAL_TABLET | ORAL | 0 refills | Status: AC | PRN

## 2020-08-17 NOTE — ED Provider Notes (Signed)
Emergency Medicine Provider Triage Evaluation Note  George Erickson , a 50 y.o. male  was evaluated in triage.  Pt complains of back pain.  Patient has a history of chronic back pain due to bulging disc.  He had an MRI performed on Friday, but has not received the results.  He reports that over the last few days that he has had worsening pain in his right leg that is actually improved today, but now he is having worsening left-sided low back pain with increased pain in his left leg.  Tonight, the pain in his low back intensified until he was unable to walk due to the intensity of the pain.  No worsening numbness or weakness.  No fevers, chills, urinary or fecal incontinence, abdominal pain, or urinary complaints.  He has been taking his home medications without improvements.  Review of Systems  Positive: Back pain, gait difficulty Negative: Urinary or fecal incontinence, numbness, weakness, fever, chills, abdominal pain, hematuria, dysuria  Physical Exam  There were no vitals taken for this visit. Gen:   Awake, no distress   Resp:  Normal effort  MSK:   Moves extremities without difficulty  Other:  To notify patient over the left SI joint.  No crepitus or step-offs noted to the spine.  No focal tenderness noted to the spinous processes.  Sensation is intact to the bilateral lower extremities.  Good strength against resistance with dorsiflexion plantarflexion.  Medical Decision Making  Medically screening exam initiated at 1:25 AM.  Appropriate orders placed.  Cristela Blue was informed that the remainder of the evaluation will be completed by another provider, this initial triage assessment does not replace that evaluation, and the importance of remaining in the ED until their evaluation is complete.  Patient's work-up has been initiated in the emergency department.  He will require further evaluation.   Joline Maxcy A, PA-C 08/17/20 0134    Merryl Hacker, MD 08/17/20 [phone]

## 2020-08-17 NOTE — ED Provider Notes (Signed)
[place] EMERGENCY DEPARTMENT Provider Note   CSN: [id number] Arrival date & time: 08/16/20  2350     History Chief Complaint  Patient presents with  . Back Pain    George Erickson is a 50 y.o. male.  50 y.o male with a PMH of chronic back pain presents to the ED with a chief complaint of ongoing back pain.  Patient was seen by his PCP Dr. Justin Mend, had MRI lumbar spine order but has not been given the results.  He reports after his evaluation in office, he was prescribed prednisone, hydrocodone, Flexeril with some improvement in symptoms.  He described most of his pain is being placed on the lumbar spine with radiation down his right leg with some numbness and tingling.  Reports after taking the medication, his symptoms have now progressed to the left leg, he feels that he was likely overcompensating with his left.  He does report receiving steroid injections in the past by Dr. Ronny Flurry, at [facility].  He denies any urinary retention, trauma, bowel incontinence or fevers.  No prior history of IV drug use.  The history is provided by the patient.       Past Medical History:  Diagnosis Date  . Allergic rhinitis due to pollen   . Arthritis    lower back  . CAD S/P DES PCI D1 11/15/2009   a) NSTEMI - 100% D1 (DES PCI - Promus DES 2.25 x 23 -- 2.3 mm); otherwise minimal CAD in a co-dominant system;; b) 02/06/20: OM3 80% (DES PCI), D1 70% ISR, mRCA 60-65%.   . Chronic left shoulder pain    Impingement syndrome  . Erectile dysfunction   . Gout   . Hyperlipidemia with target LDL less than 70    Associated CAD  . Hypertension   . Hypertensive kidney disease with CKD stage IV ([place])    Cr as of August 2021 1.92  . Non-ST elevation (NSTEMI) myocardial infarction ([place]) 11/2009; 01/2020   a) 9/'11: Cath - 100% D1 --> DES PCI Promus 2.25 x 23 (2.3 mm).  EF 55% with anterolateral HK. -> patent stent in Sept 2012 & 2015; b) 02/05/2020: NSTEMI - Echo EF >55% no RWMA -  80% OM3 (DES PCI), 70% D1 ISR, 60-65% mRCA.  . Obesity (BMI 30.0-34.9)   . OSA on CPAP    uses cpap  . Vertigo     Patient Active Problem List   Diagnosis Date Noted  . Non-ST elevation (NSTEMI) myocardial infarction ([place]) 02/24/2020  . Coronary artery disease involving native coronary artery of native heart with angina pectoris ([place]) 02/24/2020  . CKD (chronic kidney disease), stage III ([place]) 01/25/2020  . Abnormal nuclear stress test 01/23/2020  . Hyperlipidemia with target LDL less than 70 12/01/2019  . Palpitations - with exertion 12/01/2019  . Nonspecific abnormal electrocardiogram (ECG) (EKG) 12/01/2019  . Primary gout   . Pain in the chest   . Cough   . Dysphagia   . Right knee pain   . Leukocytosis   . CAP (community acquired pneumonia) 07/07/2014  . Essential hypertension 07/07/2014  . Hypokalemia 07/07/2014  . Hyperglycemia 07/07/2014  . ST elevation myocardial infarction (STEMI) of lateral wall, subsequent episode of care ([place]) 11/30/2009  . CAD S/P percutaneous coronary angioplasty 11/15/2009    Past Surgical History:  Procedure Laterality Date  . CORONARY STENT INTERVENTION  02/06/2020   [facility] [place], [place] -> Dr. Gretta Cool): NSTEMI: OM3 80% -> DES  PCI: Synergy DES 2.5 x 20 - > 2.6 mm  . CORONARY STENT PLACEMENT  11/15/2009   (Dr. Burt Knack) -- DES PCI of 100% D1 - Promus DES 2.25 x 23 --> 2.3 mm  . HERNIA REPAIR     hernia repair at birth  . LEFT HEART CATH AND CORONARY ANGIOGRAPHY  11/15/2009   (Dr. Burt Knack): NSTEMI -- LM-< LAD & LCx. ~mLAD ~30-40% just past D1 (D1 - 100% - CULPRIT - PCI), LCx normal - 2 OM & LPL. Dom RCA with Large PDA (small PL), ~30% mRCA.  EF ~55% with Ant-Lat HK   . LEFT HEART CATH AND CORONARY ANGIOGRAPHY  11/22/2010   (Dr. Wynonia Lawman): Patent Diag 1 stent with minimal CAD elsewhere.  EF ~55-60% with Anterolateral HK  . LEFT HEART CATH AND CORONARY ANGIOGRAPHY  12/31/2013   [facility] - for Abnormal Myoview) -->  Non-obstructive CAD with widely patent D1 stent.  LVEDP ~16 mmHg  . LEFT HEART CATH AND CORONARY ANGIOGRAPHY  02/06/2020   [facility] [place], [place] -> Dr. Elsie Amis): NSTEMI: D1 70% mid stent ISR; OM3 80% (-> DES PCI), mRCA 60-65%.    [facility] NASAL SINUS SURGERY  2010   For sleep apnea  . NM MYOVIEW LTD  12/05/2019   EF> 65%.  Fixed anterior perfusion defect with normal motion suggesting artifact.  Read as LOW RISK -> but NOT NORMAL  . TM MYOVIEW  12/2013   (FALSE POSITIVE) + TM - lateral ST depressions (also noted with recovery - near syncope) -> Reversible Anterior Wall Perfusion defect - CRO Ischemia; Partialy reversible Inferior defect - ~ diaphragmatic attenuation. EF ~48%. --> Referred for CATH --> FALSE POSTIIVE  . TRANSTHORACIC ECHOCARDIOGRAM  07/08/2014   EF 60%. No RWMA. Mild LA dilation. Normal walves.  . TRANSTHORACIC ECHOCARDIOGRAM  02/05/2020   [facility] [place], [place] -> Dr. Elsie Amis): NSTEMI: EF> 55%. No R WMA. Normal valves.       Family History  Problem Relation Age of Onset  . Hypertension Mother   . Coronary artery disease Mother 34       s/p CABG  . Diabetes Mellitus II Mother   . Hyperlipidemia Mother   . Hypertension Father   . Heart attack Father 14       Had several after  . Diabetes Mellitus II Father   . Heart failure Father   . CVA Father 71  . Hyperlipidemia Father   . CVA Sister   . Hypothyroidism Sister   . Hypertension Sister   . Healthy Brother   . Healthy Brother   . Hypertension Sister   . Heart attack Sister     Social History   Tobacco Use  . Smoking status: Never Smoker  . Smokeless tobacco: Never Used  Substance Use Topics  . Alcohol use: Yes    Alcohol/week: 1.0 standard drink    Types: 1 Standard drinks or equivalent per week    Comment: Rare, Occasional glass of wine  . Drug use: No    Home Medications Prior to Admission medications   Medication Sig Start Date End Date Taking? Authorizing  Provider  HYDROcodone-acetaminophen (NORCO/VICODIN) 5-325 MG tablet Take 1 tablet by mouth every 4 (four) hours as needed for up to 3 days. 08/17/20 08/20/20 Yes Clotee Schlicker, PA-C  amLODipine (NORVASC) 10 MG tablet Take 10 mg by mouth daily. 11/24/19   [provider]  aspirin 81 MG chewable tablet     [provider]  Azelastine HCl 137 MCG/SPRAY SOLN Place 2 sprays into both nostrils in the morning and at bedtime. 11/23/19   [provider]  cholecalciferol (VITAMIN D3) 25 MCG (1000 UNIT) tablet Take 2,000 Units by mouth daily.    [provider]  famotidine (PEPCID) 20 MG tablet Take 20 mg by mouth at bedtime as needed. 06/05/19   [provider]  hydrALAZINE (APRESOLINE) 100 MG tablet Take 1 tablet (100 mg total) by mouth 2 (two) times daily. May take an additional tablet if  Systolic blood pressure is greater than 150. 02/24/20   Leonie Man, MD  isosorbide mononitrate (IMDUR) 30 MG 24 hr tablet Take 1 tablet by mouth once daily 08/02/20   Leonie Man, MD  metoprolol succinate (TOPROL-XL) 25 MG 24 hr tablet Take 1 tablet by mouth daily. Take 1/2 Tablet Daily 02/07/20   [provider]  montelukast (SINGULAIR) 10 MG tablet Take 10 mg by mouth daily.    [provider]  nitroGLYCERIN (NITROSTAT) 0.4 MG SL tablet Place 1 tablet (0.4 mg total) under the tongue every 5 (five) minutes as needed for chest pain. 01/23/20   Leonie Man, MD  ondansetron (ZOFRAN) 4 MG tablet Take 1 tablet (4 mg total) by mouth every 8 (eight) hours as needed for nausea or vomiting. 03/27/20   Marcello Fennel, PA-C  pravastatin (PRAVACHOL) 40 MG tablet Take 1 tablet (40 mg total) by mouth every evening. 12/01/19 02/29/20  Leonie Man, MD  [address]AIR [address] [phone] Base) MCG/ACT AEPB Inhale into the lungs. 01/08/20   [provider]  ticagrelor (BRILINTA) 90 MG TABS tablet Take 1 tablet (90 mg total) by mouth 2 (two) times daily. 02/24/20    Leonie Man, MD    Allergies    Niacin and related, Zocor [simvastatin-high dose], and Zocor [simvastatin]  Review of Systems   Review of Systems  Constitutional: Negative for fever.  HENT: Negative for sore throat.   Respiratory: Negative for shortness of breath.   Cardiovascular: Negative for chest pain.  Gastrointestinal: Negative for abdominal pain and nausea.  Genitourinary: Negative for flank pain.  Musculoskeletal: Positive for back pain. Negative for myalgias and neck pain.  Skin: Negative for pallor and wound.  Neurological: Negative for light-headedness and headaches.  All other systems reviewed and are negative.   Physical Exam Updated Vital Signs BP (!) 149/95 (BP Location: Left Arm)   Pulse 75   Temp 98.9 F (37.2 C) (Oral)   Resp 20   Ht 5\' 11"  (1.803 m)   SpO2 98%   BMI 33.92 kg/m   Physical Exam Vitals and nursing note reviewed.  Constitutional:      Appearance: Normal appearance.  HENT:     Head: Normocephalic and atraumatic.     Mouth/Throat:     Mouth: Mucous membranes are moist.  Cardiovascular:     Rate and Rhythm: Normal rate.  Pulmonary:     Effort: Pulmonary effort is normal.     Breath sounds: No wheezing or rales.  Abdominal:     General: Abdomen is flat.  Musculoskeletal:     Cervical back: Normal range of motion and neck supple.     Lumbar back: Spasms, tenderness and bony tenderness present. Normal range of motion. Positive left straight leg raise test.     Comments: Pain with palpation of the lumbar spine, more significant at the left lumbar area.  Bony tenderness noted.  No CVA tenderness. RLE- KF,KE 5/5 strength LLE-  HF, HE 5/5 strength Antalgic gait. No pronator drift. No leg drop.  CN I, II and VIII not tested. CN II-XII grossly intact bilaterally.     Skin:    General: Skin is warm and dry.  Neurological:     Mental Status: He is alert and oriented to person, place, and time.     ED Results / Procedures /  Treatments   Labs (all labs ordered are listed, but only abnormal results are displayed) Labs Reviewed  CBC - Abnormal; Notable for the following components:      Result Value   WBC 18.7 (*)    RBC 3.51 (*)    Hemoglobin 10.7 (*)    HCT 29.7 (*)    nRBC 0.4 (*)    All other components within normal limits  BASIC METABOLIC PANEL - Abnormal; Notable for the following components:   Glucose, Bld 109 (*)    BUN 25 (*)    Creatinine, Ser 2.20 (*)    Calcium 8.8 (*)    GFR, Estimated 36 (*)    All other components within normal limits    EKG None  Radiology No results found.  Procedures Procedures   Medications Ordered in ED Medications  oxyCODONE-acetaminophen (PERCOCET/ROXICET) 5-325 MG per tablet 1 tablet (has no administration in time range)  oxyCODONE-acetaminophen (PERCOCET/ROXICET) 5-325 MG per tablet 1 tablet (1 tablet Oral Given 08/17/20 0136)    ED Course  I have reviewed the triage vital signs and the nursing notes.  Pertinent labs & imaging results that were available during my care of the patient were reviewed by me and considered in my medical decision making (see chart for details).  Clinical Course as of 08/17/20 0841  Tue Aug 17, 2020  0833 Creatinine(!): 2.20 At his baseline [JS]  0833 WBC(!): 18.7 Chronic leukocytosis, on steroids currently [JS]    Clinical Course User Index [JS] Janeece Fitting, PA-C   MDM Rules/Calculators/A&P  Patient presents to the ED with acute on chronic back pain that has been going on since 2016.  Recently evaluated by his PCP Dr. Justin Mend who ordered an MRI lumbar spine in order to Further evaluate his back pain and she was having some decrease in reflexes on the right leg.  Patient is currently on prednisone, Norco, Flexeril with mild improvement in his symptoms.  Reports the pain has not radiated from the right lumbar spine to the left side.  Exacerbated with movement, unable to obtain any relief.  He does not have any red flags  such as fever, urinary retention, bowel incontinence, history of CVA.  He is an CKD IIIpatient, creatinine level on today's visit is 2.2, is consistent with his prior baseline.  BMP without any electrolyte derangement.  CBC with a chronic leukocytosis of 18, he is currently on steroids.  Hemoglobin is low at 10, he has prior history of low hemoglobin, is within his baseline.  Vitals are within normal limits.    Extensive chart reviewed showed a CT lumbar spine, this report was printed and given to patient.  Does report follow-up primarily by Dr. Ronny Flurry who has given him steroid injections to his back, we did discuss further referral to neurosurgery.  He is agreeable of this at this time.  We did discuss refilling his pain medication, as he reports the pain has been on tolerable, and he only has 1 tablet of Norco left.   CT lumbar spine showed: Comparison is made to the prior lumbar spine MRI of 07/23/2014.  Lumbar spondylosis, as outlined and with findings most notably as follows.  At L4-L5, there is progressive mild/moderate disc degeneration. Progressive disc bulge with endplate spurring. New superimposed 1.4 cm right center/subarticular disc extrusion with cranial migration to the mid L4 vertebral body level. Moderate/advanced facet arthrosis with ligamentum flavum hypertrophy. Progressive moderate right greater than left subarticular stenosis with potential to affect either descending L5 nerve root. Progressive moderate central canal stenosis. Additionally, the cranially migrated disc extrusion encroaches upon the descending right L4 nerve root prior to entering the right neural foramen. Progressive bilateral neural foraminal narrowing (mild/moderate right, mild left).  At L5-S1, there is advanced disc degeneration. Broad-based left center/subarticular disc extrusion. As before, this contributes to severe left subarticular stenosis, encroaching upon descending left-sided nerve roots  (most notably the descending left S1 nerve root). Mild right subarticular and central canal narrowing, and moderate left neural foraminal narrowing, also present at this level. These findings are unchanged.  No more than mild spinal canal or neural foraminal narrowing at the remaining levels. Spondylosis has progressed at L2-L3.  Patient is agreeable of following up with Dr. Justin Mend, will also provide neurosurgery follow-up.  He was also refilled his Norco medication that we discussed we do not usually refill narcotics, I do understand his pain is acute on chronic.  Patient is in the recent management, return precautions discussed at length.  Patient stable for discharge.   Portions of this note were generated with [employer]. Dictation errors may occur despite best attempts at proofreading.  Final Clinical Impression(s) / ED Diagnoses Final diagnoses:  Bulging lumbar disc  Chronic left-sided low back pain with left-sided sciatica    Rx / DC Orders ED Discharge Orders         Ordered    HYDROcodone-acetaminophen (NORCO/VICODIN) 5-325 MG tablet  Every 4 hours PRN        08/17/20 0840           Janeece Fitting, PA-C 08/17/20 0841    Lacretia Leigh, MD 08/23/20 1447

## 2020-08-17 NOTE — ED Triage Notes (Signed)
Patient reports worsening left lower back pain radiating down to lower leg this week , denies injury or fall , no urinary incontinence or weakness.

## 2020-08-17 NOTE — ED Notes (Signed)
Discharge paperwork given to pt along with prescriptions. Pt agreeable to discharge and understands instructions. Esignature pad not working.

## 2020-08-17 NOTE — Discharge Instructions (Addendum)
You were provided with a copy of your CT results from today's visit.  You will need to follow-up with Dr. Justin Mend at your scheduled appointment today.  Neurosurgery's phone number is attached to your discharge records, please schedule an appointment for further evaluation.

## 2020-08-19 DIAGNOSIS — M4316 Spondylolisthesis, lumbar region: Secondary | ICD-10-CM | POA: Diagnosis not present

## 2020-08-19 DIAGNOSIS — I10 Essential (primary) hypertension: Secondary | ICD-10-CM | POA: Diagnosis not present

## 2020-08-19 DIAGNOSIS — M47816 Spondylosis without myelopathy or radiculopathy, lumbar region: Secondary | ICD-10-CM | POA: Diagnosis not present

## 2020-08-19 DIAGNOSIS — M5136 Other intervertebral disc degeneration, lumbar region: Secondary | ICD-10-CM | POA: Diagnosis not present

## 2020-08-26 ENCOUNTER — Emergency Department (HOSPITAL_COMMUNITY)
Admission: EM | Admit: 2020-08-26 | Discharge: 2020-08-27 | Disposition: A | Discharge status: Home / Self Care | Payer: BC Managed Care – PPO | Attending: Emergency Medicine | Admitting: Emergency Medicine

## 2020-08-26 ENCOUNTER — Emergency Department (HOSPITAL_COMMUNITY): Payer: BC Managed Care – PPO

## 2020-08-26 DIAGNOSIS — M25511 Pain in right shoulder: Secondary | ICD-10-CM | POA: Diagnosis not present

## 2020-08-26 DIAGNOSIS — N184 Chronic kidney disease, stage 4 (severe): Secondary | ICD-10-CM | POA: Diagnosis not present

## 2020-08-26 DIAGNOSIS — M6281 Muscle weakness (generalized): Secondary | ICD-10-CM | POA: Diagnosis not present

## 2020-08-26 DIAGNOSIS — Z79899 Other long term (current) drug therapy: Secondary | ICD-10-CM | POA: Insufficient documentation

## 2020-08-26 DIAGNOSIS — I25119 Atherosclerotic heart disease of native coronary artery with unspecified angina pectoris: Secondary | ICD-10-CM | POA: Insufficient documentation

## 2020-08-26 DIAGNOSIS — M19011 Primary osteoarthritis, right shoulder: Secondary | ICD-10-CM | POA: Diagnosis not present

## 2020-08-26 DIAGNOSIS — Z7982 Long term (current) use of aspirin: Secondary | ICD-10-CM | POA: Diagnosis not present

## 2020-08-26 DIAGNOSIS — I129 Hypertensive chronic kidney disease with stage 1 through stage 4 chronic kidney disease, or unspecified chronic kidney disease: Secondary | ICD-10-CM | POA: Diagnosis not present

## 2020-08-26 IMAGING — DX DG SHOULDER 2+V*R*
4 series · 4 of 4 positions shown · non-contrast
Comparison: Chest x-ray 122 frontal 16 20

CLINICAL DATA: Shoulder pain

EXAM:
RIGHT SHOULDER - 2+ VIEW

[w shoulder internal right]
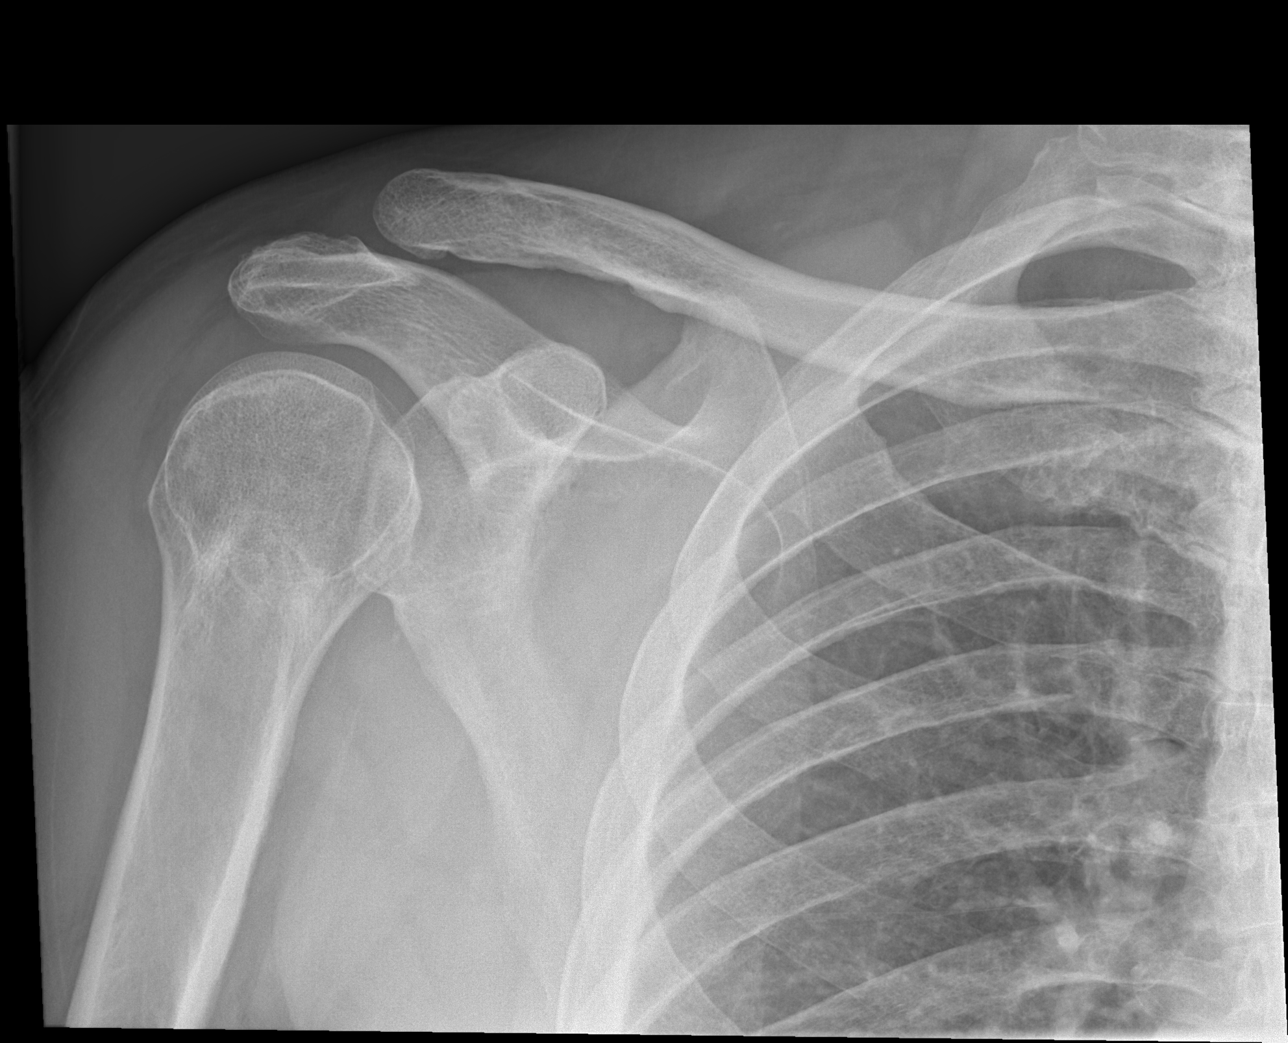

[w shoulder axillary right (1 of 2)]
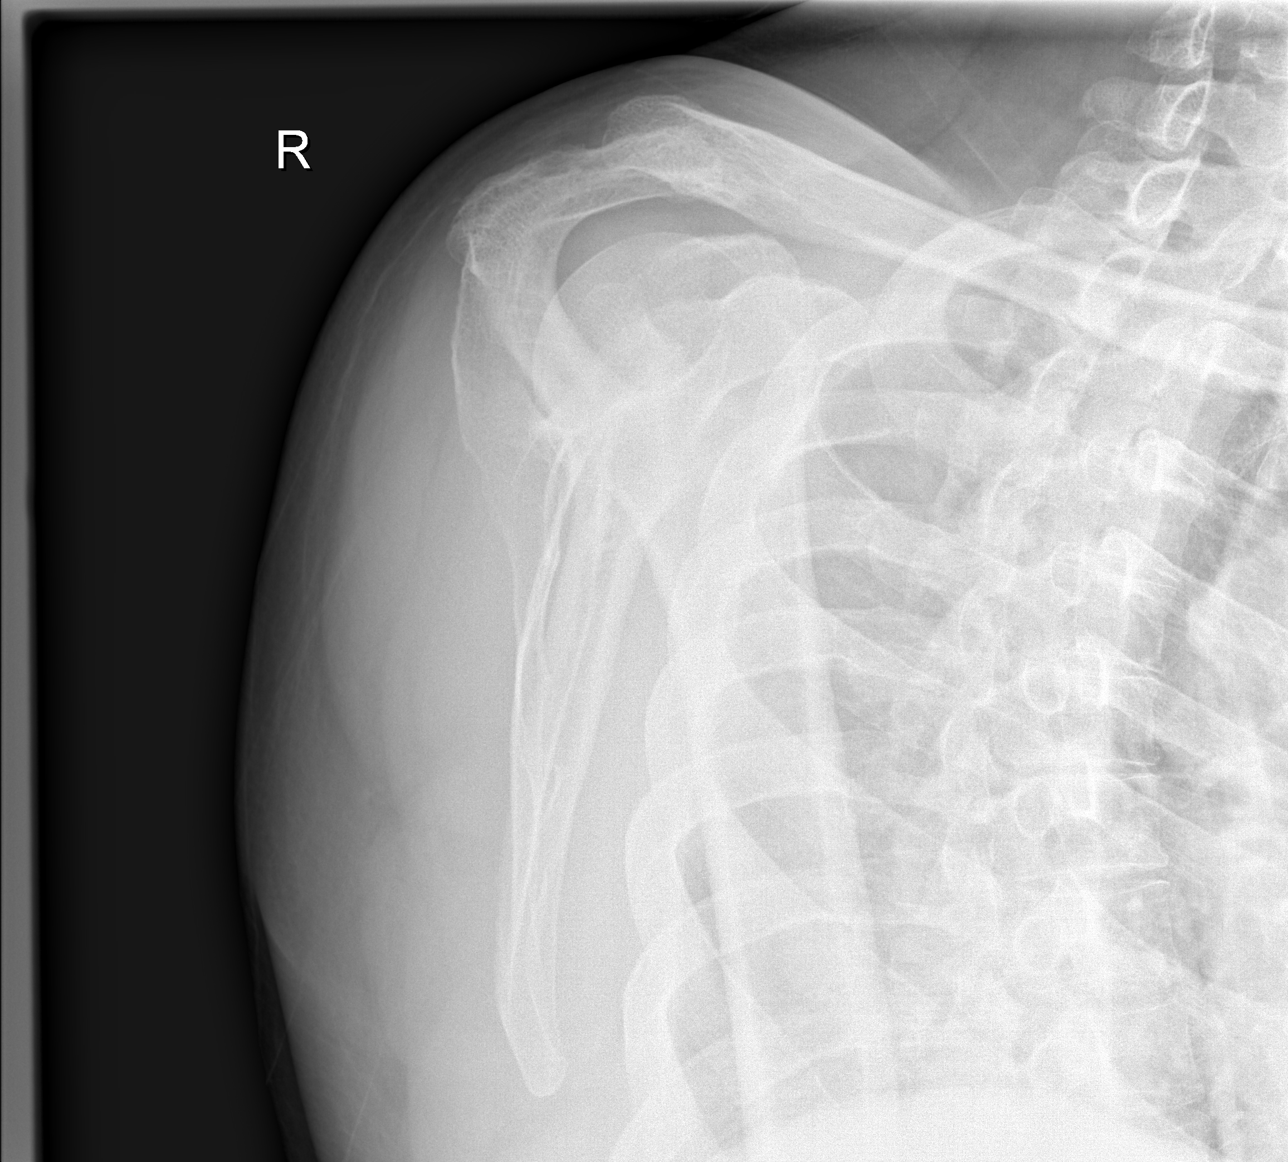

[w shoulder external right]
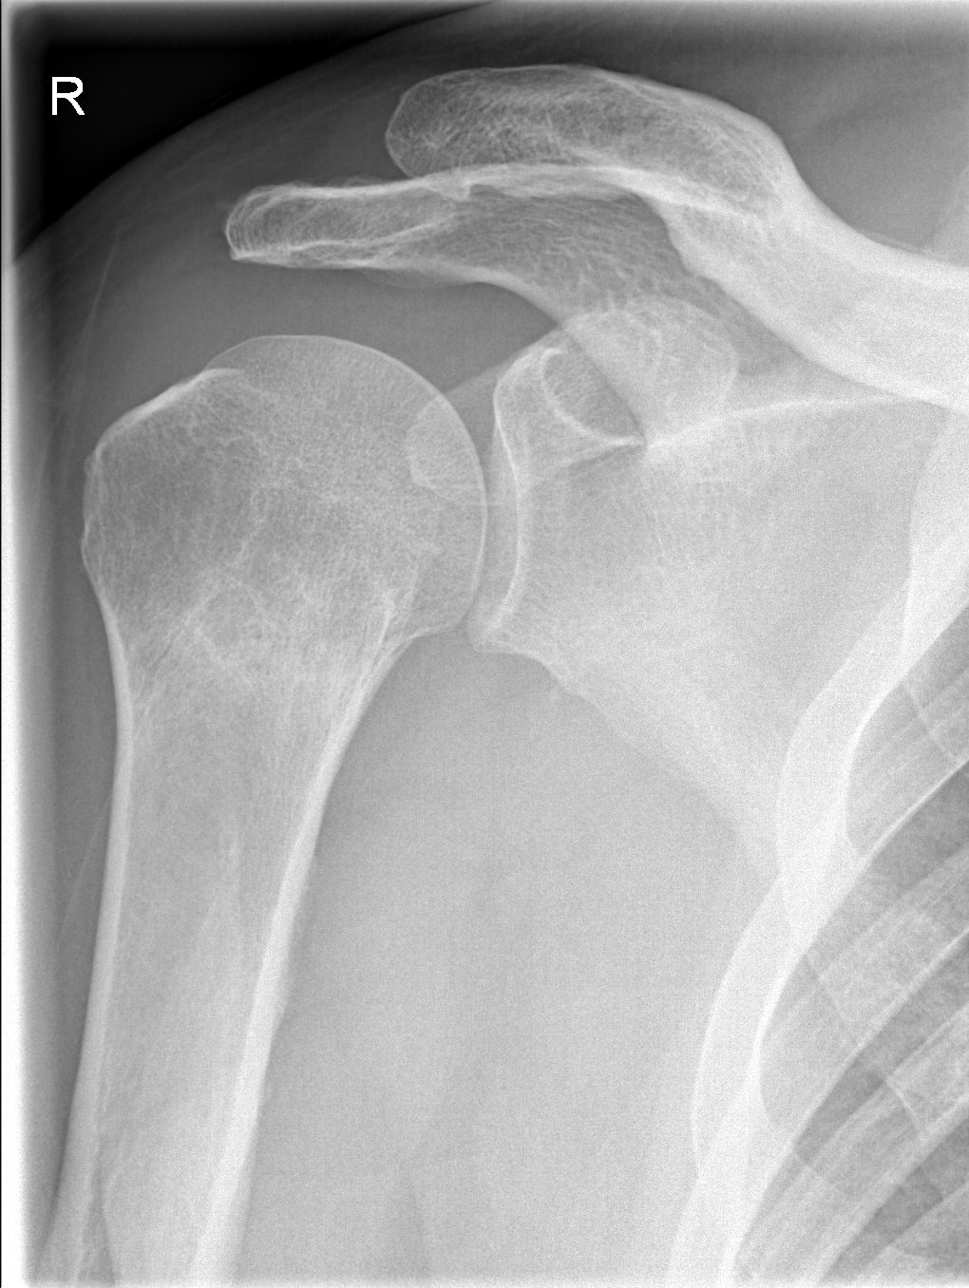

[w shoulder axillary right (2 of 2)]
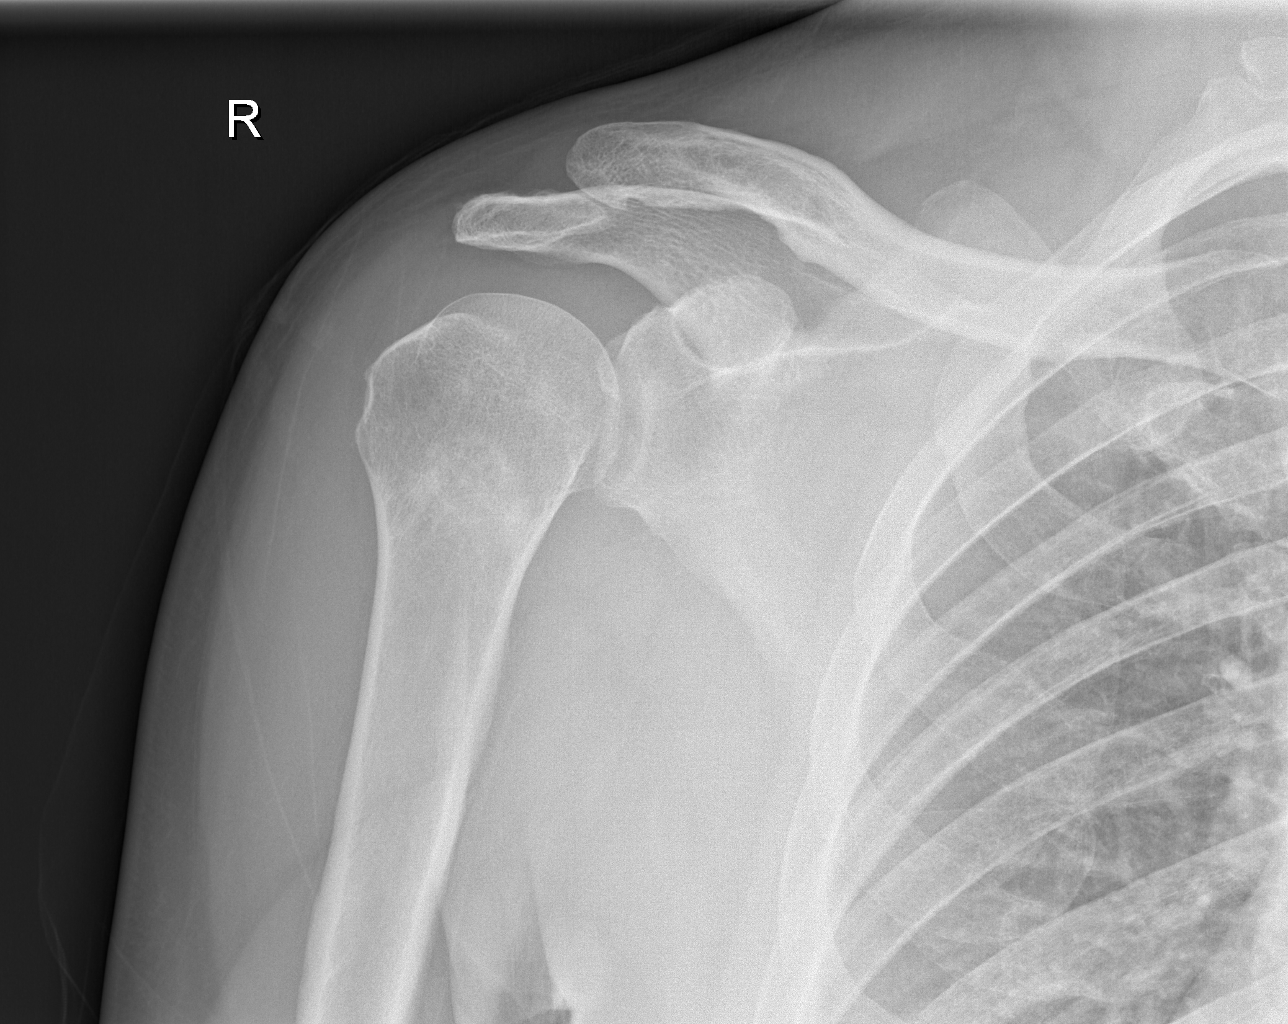

[4 of 4 positions shown; findings below may reference images not displayed]

FINDINGS: Slight elevation of the distal clavicular head relative to the
acromion is unchanged from prior studies and symmetric to the
contralateral side likely reflecting the physiologic alignment for
this patient. No acute bony abnormality. Specifically, no fracture,
subluxation, or dislocation. Mild arthrosis across the glenohumeral
and acromioclavicular joints. Included portions of the chest wall
are free of acute abnormality.
IMPRESSION: No acute osseous abnormality.

Mild degenerative changes of the glenohumeral, acromioclavicular
joints.

## 2020-08-26 NOTE — ED Triage Notes (Signed)
Pt c/o R arm pain/weakness since last night. Used prescribed muscle relaxer (for back) & states that did not help. Unable to move arm much without pain. 10/10 pain when moving

## 2020-08-26 NOTE — ED Provider Notes (Signed)
Emergency Medicine Provider Triage Evaluation Note  George Erickson , a 50 y.o. male  was evaluated in triage.  Pt complains of right shoulder pain for the past week or so, worsening.  States it feels like muscle spasms.  Tried home muscle relaxer he has for his back but no relief.  He is left handed.  Review of Systems  Positive: Right shoulder/arm pain Negative: Numbness/weakness  Physical Exam  BP (!) 172/117   Pulse (!) 102   Temp 98.5 F (36.9 C)   Resp 16   SpO2 97%   Gen:   Awake, no distress   Resp:  Normal effort  MSK:   Limited movement of right shoulder due to pain, radial pulse intact, moving fingers as normal  Medical Decision Making  Medically screening exam initiated at 10:27 PM.  Appropriate orders placed.  Cristela Blue was informed that the remainder of the evaluation will be completed by another provider, this initial triage assessment does not replace that evaluation, and the importance of remaining in the ED until their evaluation is complete.    Larene Pickett, PA-C 08/26/20 2229    Lajean Saver, MD 08/26/20 [phone]

## 2020-08-27 MED ORDER — HYDRALAZINE HCL 25 MG PO TABS
100.0000 mg | ORAL_TABLET | Freq: Once | ORAL | Status: AC
  Administered 2020-08-27: 100 mg via ORAL
  Filled 2020-08-27: qty 4

## 2020-08-27 MED ORDER — ISOSORBIDE MONONITRATE ER 30 MG PO TB24
30.0000 mg | ORAL_TABLET | Freq: Every day | ORAL | Status: DC
  Filled 2020-08-27: qty 1

## 2020-08-27 MED ORDER — AMLODIPINE BESYLATE 5 MG PO TABS
10.0000 mg | ORAL_TABLET | Freq: Once | ORAL | Status: AC
  Administered 2020-08-27: 10 mg via ORAL
  Filled 2020-08-27: qty 2

## 2020-08-27 MED ORDER — METOPROLOL SUCCINATE ER 25 MG PO TB24
12.5000 mg | ORAL_TABLET | Freq: Every day | ORAL | Status: DC
  Administered 2020-08-27: 12.5 mg via ORAL
  Filled 2020-08-27: qty 1

## 2020-08-27 MED ORDER — DICLOFENAC SODIUM 1 % EX GEL: 2.0000 g | Freq: Four times a day (QID) | CUTANEOUS | 0 refills | Status: DC

## 2020-08-27 NOTE — ED Provider Notes (Signed)
[facility] EMERGENCY DEPARTMENT Provider Note   CSN: [id number] Arrival date & time: 08/26/20  1956     History Chief Complaint  Patient presents with   Arm Pain    George Erickson is a 50 y.o. male presenting for evaluation of right shoulder pain.  Patient states the past 2 days he has had pain in his right shoulder.  Is mostly in his armpit and goes to his mid upper arm.  He reports associated weakness in this arm, partially due to pain.  He states earlier in the day 2 days ago he was climbing up and down a ladder using mostly his right arm and right leg, but did not have pain until later that night.  He described it initially as a dull ache, is now a sharp spasming pain.  He took a muscle relaxer and pain medication that he was given a few days ago for his back without improvement symptoms, has not tried anything else.  He denies fevers or chills.  No fall, trauma, or injury.  Pain does not radiate.  No numbness or tingling.  He has an orthopedic doctor with [facility], but has not been for several years.  HPI     Past Medical History:  Diagnosis Date   Allergic rhinitis due to pollen    Arthritis    lower back   CAD S/P DES PCI D1 11/15/2009   a) NSTEMI - 100% D1 (DES PCI - Promus DES 2.25 x 23 -- 2.3 mm); otherwise minimal CAD in a co-dominant system;; b) 02/06/20: OM3 80% (DES PCI), D1 70% ISR, mRCA 60-65%.    Chronic left shoulder pain    Impingement syndrome   Erectile dysfunction    Gout    Hyperlipidemia with target LDL less than 70    Associated CAD   Hypertension    Hypertensive kidney disease with CKD stage IV (HCC)    Cr as of August 2021 1.92   Non-ST elevation (NSTEMI) myocardial infarction ([place]) 11/2009; 01/2020   a) 9/'11: Cath - 100% D1 --> DES PCI Promus 2.25 x 23 (2.3 mm).  EF 55% with anterolateral HK. -> patent stent in Sept 2012 & 2015; b) 02/05/2020: NSTEMI - Echo EF >55% no RWMA - 80% OM3 (DES PCI), 70% D1 ISR, 60-65% mRCA.    Obesity (BMI 30.0-34.9)    OSA on CPAP    uses cpap   Vertigo     Patient Active Problem List   Diagnosis Date Noted   Non-ST elevation (NSTEMI) myocardial infarction ([place]) 02/24/2020   Coronary artery disease involving native coronary artery of native heart with angina pectoris ([place]) 02/24/2020   CKD (chronic kidney disease), stage III ([place]) 01/25/2020   Abnormal nuclear stress test 01/23/2020   Hyperlipidemia with target LDL less than 70 12/01/2019   Palpitations - with exertion 12/01/2019   Nonspecific abnormal electrocardiogram (ECG) (EKG) 12/01/2019   Primary gout    Pain in the chest    Cough    Dysphagia    Right knee pain    Leukocytosis    CAP (community acquired pneumonia) 07/07/2014   Essential hypertension 07/07/2014   Hypokalemia 07/07/2014   Hyperglycemia 07/07/2014   ST elevation myocardial infarction (STEMI) of lateral wall, subsequent episode of care [facility]) 11/30/2009   CAD S/P percutaneous coronary angioplasty 11/15/2009    Past Surgical History:  Procedure Laterality Date   CORONARY STENT INTERVENTION  02/06/2020   [facility] [place], [place] -> Dr. Yehuda Savannah  Ejeh): NSTEMI: OM3 80% -> DES PCI: Synergy DES 2.5 x 20 - > 2.6 mm   CORONARY STENT PLACEMENT  11/15/2009   (Dr. Burt Knack) -- DES PCI of 100% D1 - Promus DES 2.25 x 23 --> 2.3 mm   HERNIA REPAIR     hernia repair at birth   LEFT HEART CATH AND CORONARY ANGIOGRAPHY  11/15/2009   (Dr. Burt Knack): NSTEMI -- LM-< LAD & LCx. ~mLAD ~30-40% just past D1 (D1 - 100% - CULPRIT - PCI), LCx normal - 2 OM & LPL. Dom RCA with Large PDA (small PL), ~30% mRCA.  EF ~55% with Ant-Lat HK    LEFT HEART CATH AND CORONARY ANGIOGRAPHY  11/22/2010   (Dr. Wynonia Lawman): Patent Diag 1 stent with minimal CAD elsewhere.  EF ~55-60% with Anterolateral HK   LEFT HEART CATH AND CORONARY ANGIOGRAPHY  12/31/2013   [facility] - for Abnormal Myoview) --> Non-obstructive CAD with widely patent D1 stent.  LVEDP ~16 mmHg   LEFT HEART CATH  AND CORONARY ANGIOGRAPHY  02/06/2020   [place], [place] -> Dr. Elsie Amis): NSTEMI: D1 70% mid stent ISR; OM3 80% (-> DES PCI), mRCA 60-65%.     NASAL SINUS SURGERY  2010   For sleep apnea   NM MYOVIEW LTD  12/05/2019   EF> 65%.  Fixed anterior perfusion defect with normal motion suggesting artifact.  Read as LOW RISK -> but NOT NORMAL   TM MYOVIEW  12/2013   (FALSE POSITIVE) + TM - lateral ST depressions (also noted with recovery - near syncope) -> Reversible Anterior Wall Perfusion defect - CRO Ischemia; Partialy reversible Inferior defect - ~ diaphragmatic attenuation. EF ~48%. --> Referred for CATH --> FALSE POSTIIVE   TRANSTHORACIC ECHOCARDIOGRAM  07/08/2014   EF 60%. No RWMA. Mild LA dilation. Normal walves.   TRANSTHORACIC ECHOCARDIOGRAM  02/05/2020   [facility] [place], [place] -> Dr. Elsie Amis): NSTEMI: EF> 55%. No R WMA. Normal valves.       Family History  Problem Relation Age of Onset   Hypertension Mother    Coronary artery disease Mother 71       s/p CABG   Diabetes Mellitus II Mother    Hyperlipidemia Mother    Hypertension Father    Heart attack Father 33       Had several after   Diabetes Mellitus II Father    Heart failure Father    CVA Father 42   Hyperlipidemia Father    CVA Sister    Hypothyroidism Sister    Hypertension Sister    Healthy Brother    Healthy Brother    Hypertension Sister    Heart attack Sister     Social History   Tobacco Use   Smoking status: Never   Smokeless tobacco: Never  Substance Use Topics   Alcohol use: Yes    Alcohol/week: 1.0 standard drink    Types: 1 Standard drinks or equivalent per week    Comment: Rare, Occasional glass of wine   Drug use: No    Home Medications Prior to Admission medications   Medication Sig Start Date End Date Taking? Authorizing Provider  diclofenac Sodium (VOLTAREN) 1 % GEL Apply 2 g topically 4 (four) times daily. 08/27/20  Yes Luby Seamans,  PA-C  amLODipine (NORVASC) 10 MG tablet Take 10 mg by mouth daily. 11/24/19   [provider]  aspirin 81 MG chewable tablet     [provider]  Azelastine HCl 137  MCG/SPRAY SOLN Place 2 sprays into both nostrils in the morning and at bedtime. 11/23/19   [provider]  cholecalciferol (VITAMIN D3) 25 MCG (1000 UNIT) tablet Take 2,000 Units by mouth daily.    [provider]  famotidine (PEPCID) 20 MG tablet Take 20 mg by mouth at bedtime as needed. 06/05/19   [provider]  hydrALAZINE (APRESOLINE) 100 MG tablet Take 1 tablet (100 mg total) by mouth 2 (two) times daily. May take an additional tablet if  Systolic blood pressure is greater than 150. 02/24/20   Leonie Man, MD  isosorbide mononitrate (IMDUR) 30 MG 24 hr tablet Take 1 tablet by mouth once daily 08/02/20   Leonie Man, MD  metoprolol succinate (TOPROL-XL) 25 MG 24 hr tablet Take 1 tablet by mouth daily. Take 1/2 Tablet Daily 02/07/20   [provider]  montelukast (SINGULAIR) 10 MG tablet Take 10 mg by mouth daily.    [provider]  nitroGLYCERIN (NITROSTAT) 0.4 MG SL tablet Place 1 tablet (0.4 mg total) under the tongue every 5 (five) minutes as needed for chest pain. 01/23/20   Leonie Man, MD  ondansetron (ZOFRAN) 4 MG tablet Take 1 tablet (4 mg total) by mouth every 8 (eight) hours as needed for nausea or vomiting. 03/27/20   Marcello Fennel, PA-C  pravastatin (PRAVACHOL) 40 MG tablet Take 1 tablet (40 mg total) by mouth every evening. 12/01/19 02/29/20  Leonie Man, MD  PROAIR RESPICLICK 161 [phone] Base) MCG/ACT AEPB Inhale into the lungs. 01/08/20   [provider]  ticagrelor (BRILINTA) 90 MG TABS tablet Take 1 tablet (90 mg total) by mouth 2 (two) times daily. 02/24/20   Leonie Man, MD    Allergies    Niacin and related, Zocor [simvastatin-high dose], and Zocor [simvastatin]  Review of Systems   Review of Systems   Musculoskeletal:  Positive for arthralgias.  Neurological:  Positive for weakness (due to pain). Negative for numbness.   Physical Exam Updated Vital Signs BP (!) 211/120 (BP Location: Left Arm)   Pulse 83   Temp 100 F (37.8 C)   Resp 18   SpO2 96%   Physical Exam Vitals and nursing note reviewed.  Constitutional:      General: He is not in acute distress.    Appearance: He is well-developed.     Comments: Resting comfortably in the bed in no acute distress  HENT:     Head: Normocephalic and atraumatic.  Eyes:     Extraocular Movements: Extraocular movements intact.  Neck:     Comments: No ttp of midline c-spine Cardiovascular:     Rate and Rhythm: Normal rate.  Pulmonary:     Effort: Pulmonary effort is normal.  Abdominal:     General: There is no distension.  Musculoskeletal:        General: Normal range of motion.     Cervical back: Normal range of motion.     Comments: No tenderness palpation of the bony protuberance of the right shoulder.  No erythema, warmth, or swelling.  Mild discomfort with palpation of the axilla/proximal arm on the anterior aspect.  Radial pulses 2+ bilaterally.  Distal sensation and cap refill equal bilaterally.  Grip strength equal bilaterally.  No ttp of back  Skin:    General: Skin is warm.     Capillary Refill: Capillary refill takes less than 2 seconds.     Findings: No rash.  Neurological:  Mental Status: He is alert and oriented to person, place, and time.    ED Results / Procedures / Treatments   Labs (all labs ordered are listed, but only abnormal results are displayed) Labs Reviewed - No data to display  EKG None  Radiology DG Shoulder Right  Result Date: 08/26/2020 CLINICAL DATA:  Shoulder pain EXAM: RIGHT SHOULDER - 2+ VIEW COMPARISON:  Chest x-ray 122 frontal 16 20 FINDINGS: Slight elevation of the distal clavicular head relative to the acromion is unchanged from prior studies and symmetric to the contralateral side  likely reflecting the physiologic alignment for this patient. No acute bony abnormality. Specifically, no fracture, subluxation, or dislocation. Mild arthrosis across the glenohumeral and acromioclavicular joints. Included portions of the chest wall are free of acute abnormality. IMPRESSION: No acute osseous abnormality. Mild degenerative changes of the glenohumeral, acromioclavicular joints. Electronically Signed   By: Lovena Le M.D.   On: 08/26/2020 23:22    Procedures Procedures   Medications Ordered in ED Medications  isosorbide mononitrate (IMDUR) 24 hr tablet 30 mg (0 mg Oral Hold 08/27/20 0950)  metoprolol succinate (TOPROL-XL) 24 hr tablet 12.5 mg (12.5 mg Oral Given 08/27/20 0950)  hydrALAZINE (APRESOLINE) tablet 100 mg (100 mg Oral Given 08/27/20 0950)  amLODipine (NORVASC) tablet 10 mg (10 mg Oral Given 08/27/20 0950)    ED Course  I have reviewed the triage vital signs and the nursing notes.  Pertinent labs & imaging results that were available during my care of the patient were reviewed by me and considered in my medical decision making (see chart for details).    MDM Rules/Calculators/A&P                          Patient presenting for evaluation of right shoulder pain.  On exam, patient appears nontoxic.  He is neurovascularly intact.  X-ray obtained from triage viewed and independently interpreted by me, no fracture or dislocation.  Patient reports increased physical activity earlier in the day when his pain began, this is likely the cause for his discomfort.  Discussed with patient.  Discussed symptomatic management.  As he is on Brilinta, will give Voltaren gel.  He already has muscle relaxers and pain medicine at home.  We will give a sling as needed for support. Of note, patient's blood pressure is elevated.  He did not take his blood pressure medicine last night or this morning, and this is likely why it is elevated.  No headache, chest pain, or clear signs of endorgan  damage.  Home medications given.  Repeat blood pressure evaluation after medication shows improvement.  At this time, patient appears safe for discharge.  Return precautions given.  Patient states he understands and agrees to plan.  Final Clinical Impression(s) / ED Diagnoses Final diagnoses:  Acute pain of right shoulder    Rx / DC Orders ED Discharge Orders          Ordered    diclofenac Sodium (VOLTAREN) 1 % GEL  4 times daily        08/27/20 1032             Raedyn Klinck, PA-C 08/27/20 1044    Blanchie Dessert, MD 08/27/20 1408

## 2020-08-27 NOTE — Discharge Instructions (Addendum)
Continue taking her medications as prescribed. Use Voltaren gel to help with pain and inflammation. Use the sling as needed for comfort or pain control. Use heat or ice, whichever makes it feel better. You may continue to take the muscle relaxer and pain medicine as needed for severe pain. Follow-up with a orthopedic doctor for further evaluation. Return to the emergency room if you develop high fevers, numbness in your hand, color change of your hand, or any new, worsening, concerning symptoms

## 2020-08-27 NOTE — ED Notes (Signed)
PA aware of elevated bp.  OK to d/c.

## 2020-09-01 DIAGNOSIS — M5412 Radiculopathy, cervical region: Secondary | ICD-10-CM | POA: Diagnosis not present

## 2020-09-22 DIAGNOSIS — M5416 Radiculopathy, lumbar region: Secondary | ICD-10-CM | POA: Diagnosis not present

## 2020-09-22 DIAGNOSIS — M5126 Other intervertebral disc displacement, lumbar region: Secondary | ICD-10-CM | POA: Diagnosis not present

## 2020-09-23 ENCOUNTER — Telehealth: Payer: Self-pay | Admitting: *Deleted

## 2020-09-23 ENCOUNTER — Encounter: Payer: Self-pay | Admitting: Nurse Practitioner

## 2020-09-23 NOTE — Telephone Encounter (Signed)
   [place] Pre-operative Risk Assessment    Patient Name: George Erickson  DOB: 1970-12-05 MRN: [id number]  HEARTCARE STAFF:  - IMPORTANT!!!!!! Under Visit Info/Reason for Call, type in Other and utilize the format Clearance MM/DD/YY or Clearance TBD. Do not use dashes or single digits. - Please review there is not already an duplicate clearance open for this procedure. - If request is for dental extraction, please clarify the # of teeth to be extracted. - If the patient is currently at the dentist's office, call Pre-Op Callback Staff (MA/nurse) to input urgent request.  - If the patient is not currently in the dentist office, please route to the Pre-Op pool.  Request for surgical clearance:  What type of surgery is being performed? MICRODISCECTOMY, LUMBAR  When is this surgery scheduled? TBD  What type of clearance is required (medical clearance vs. Pharmacy clearance to hold med vs. Both)? BOTH  Are there any medications that need to be held prior to surgery and how long? [facility] AND [place] name and name of physician performing surgery? [facility]; DR. Pieter Partridge DAWLEY  What is the office phone number? [phone]   7.   What is the office fax number? [place]: [email or web address]  8.   Anesthesia type (None, local, MAC, general) ? GENERAL   Julaine Hua 09/23/2020, 1:08 PM  _________________________________________________________________   (provider comments below)

## 2020-09-23 NOTE — Telephone Encounter (Signed)
    Patient Name: George Erickson  DOB: 03-15-1970 MRN: [id number]  Primary Cardiologist: Glenetta Hew, MD  Chart reviewed as part of pre-operative protocol coverage.   Patient has a history of CAD s/p remote PCI of diagonal branch and more recent PCI/DES to Lcx/OM with 3 overlapping stents 01/2020. At his last office visit with Dr. Ellyn Hack 05/17/20 he was recommended to continue uninterrupted DAPT through 01/2021, then consider de-escalating his DAPT. More recently, patient has had worsening low back pain and is pursuing a lumbar microdiscectomy.   Dr. Ellyn Hack, would you consider interrupting DAPT prior to 01/2020 or prefer he continue to avoid non-emergent procedures until 01/2021? Please route your response back to P CV DIV PREOP. Thank you!  Abigail Butts, PA-C 09/23/2020, 2:51 PM

## 2020-09-27 NOTE — Telephone Encounter (Signed)
Please contact patient.  Dr. Ellyn Hack has reviewed his preoperative surgical request and would like to postpone his back surgery if possible.  Dr. Ellyn Hack feels that if the surgery is very urgent/emergent stopping his antiplatelet therapy would be acceptable but not ideal.  He has increased risk for stent occlusion.  Thank you.

## 2020-09-27 NOTE — Telephone Encounter (Signed)
I s/w Nikki, surgery scheduler for Dr. Pieter Partridge Dawley. I called to clarify per Dr. Ellyn Hack if surgery was of urgent nature. Please see notes from Dr. Ellyn Hack who would prefer if surgery is not urgent would prefer to hold off on surgery until pt is 1 full yr from stent placed. Per Dr. Ellyn Hack pt is 8 months out from stent placed. Per Lexine Baton, surgery is not of urgent nature. Lexine Baton states she will let Dr. Reatha Armour know of the recommendations/preference  from Dr. Ellyn Hack. Lexine Baton thanked me for the call today and stated she feels that Dr. Reatha Armour will be accepting of the recommendations from Dr. Ellyn Hack. I will fax notes to [facility].

## 2020-10-20 ENCOUNTER — Other Ambulatory Visit: Payer: Self-pay

## 2020-10-20 ENCOUNTER — Encounter: Payer: Self-pay | Admitting: Cardiology

## 2020-10-20 ENCOUNTER — Ambulatory Visit (INDEPENDENT_AMBULATORY_CARE_PROVIDER_SITE_OTHER): Payer: BC Managed Care – PPO | Admitting: Cardiology

## 2020-10-20 VITALS — BP 148/80 | HR 82 | Ht 71.0 in | Wt 243.0 lb

## 2020-10-20 DIAGNOSIS — I214 Non-ST elevation (NSTEMI) myocardial infarction: Secondary | ICD-10-CM

## 2020-10-20 DIAGNOSIS — I10 Essential (primary) hypertension: Secondary | ICD-10-CM

## 2020-10-20 DIAGNOSIS — Z9861 Coronary angioplasty status: Secondary | ICD-10-CM

## 2020-10-20 DIAGNOSIS — Z01810 Encounter for preprocedural cardiovascular examination: Secondary | ICD-10-CM | POA: Diagnosis not present

## 2020-10-20 DIAGNOSIS — R002 Palpitations: Secondary | ICD-10-CM

## 2020-10-20 DIAGNOSIS — I2510 Atherosclerotic heart disease of native coronary artery without angina pectoris: Secondary | ICD-10-CM

## 2020-10-20 DIAGNOSIS — E785 Hyperlipidemia, unspecified: Secondary | ICD-10-CM | POA: Diagnosis not present

## 2020-10-20 DIAGNOSIS — I25119 Atherosclerotic heart disease of native coronary artery with unspecified angina pectoris: Secondary | ICD-10-CM

## 2020-10-20 DIAGNOSIS — R9439 Abnormal result of other cardiovascular function study: Secondary | ICD-10-CM

## 2020-10-20 MED ORDER — CARVEDILOL 6.25 MG PO TABS: 6.2500 mg | ORAL_TABLET | Freq: Two times a day (BID) | ORAL | 3 refills | Status: DC

## 2020-10-20 NOTE — Patient Instructions (Signed)
Medication Instructions:  STOP TAKING METOPROLOL SUCCINATE THEN   START TAKING CARVEDILOL 6.25 MG ONE TABLET TWICE A DAY    *If you need a refill on your cardiac medications before your next appointment, please call your pharmacy*   Lab Work:  NOT NEEDED   Testing/Procedures:  NOT NEEDED  Follow-Up: At [facility], you and your health needs are our priority.  As part of our continuing mission to provide you with exceptional heart care, we have created designated Provider Care Teams.  These Care Teams include your primary Cardiologist (physician) and Advanced Practice Providers (APPs -  Physician Assistants and Nurse Practitioners) who all work together to provide you with the care you need, when you need it.     Your next appointment:   5 month(s)  The format for your next appointment:   In Person  Provider:   Glenetta Hew, MD   Other Instructions   OKAY FOR BACK SURGERY FROM A CARDIAC STANDPOINT- OKAY TO HOLD BRILINTA , ASPIRIN 10 DAYS PRIOR AND 5 DAY AFTER  SURGERY .

## 2020-10-20 NOTE — Progress Notes (Signed)
Primary Care Provider: Kinte Small, MD Cardiologist: Glenetta Hew, MD Nephrologist: Dr. Carolynne Edouard  Electrophysiologist: None  Clinic Note: Chief Complaint  Patient presents with   Follow-up    5 months.   Pre-op Exam    Needs back surgery-we will have to hold Brilinta for 10 days preop and 5 days postop.   Coronary Artery Disease    No active anginal symptoms.    ===================================  ASSESSMENT/PLAN   Problem List Items Addressed This Visit       Cardiology Problems   CAD S/P percutaneous coronary angioplasty (Chronic)    Remains on DAPT.  The goal will be for him to be uninterrupted until end of November 2022, however he is relatively urgent need for surgery likely in October.  By then he was almost a year out and is probably okay to hold Brilinta.  Preferably, this would be for 7 days, but with it being a spinal surgery, will defer to the surgeons discretion.  He was requested a 10-day hold preop and 5 days postop. --> I did mention to George Erickson that this is somewhat concerning, but the father out we get from his PCI, the more safe it would be. I think he needs to have a surgery and therefore understand the risk, he will hold aspirin and wanted to 10 days preop and 5 days postop back surgery. He would not restart to continue 90 mg twice daily Brilinta through the end of November. ->  Going forward we would then DC aspirin and switch to either maintenance dose 60 mg twice daily Brilinta versus clopidogrel 75 mg daily partially due to the cost and partially because of his dyspnea issues.)      Relevant Medications   carvedilol (COREG) 6.25 MG tablet   Non-ST elevation (NSTEMI) myocardial infarction (HCC) - Primary (Chronic)   Relevant Medications   carvedilol (COREG) 6.25 MG tablet   Hyperlipidemia with target LDL less than 70 (Chronic)    He is other recently had labs checked or is due to have labs checked soon by his PCP to follow-up his lipids. Remains on  pravastatin.  If not maintaining goal, would have low threshold to convert to rosuvastatin.  Will be on look out for lab results from PCP.Marland Kitchen      Relevant Medications   carvedilol (COREG) 6.25 MG tablet   Essential hypertension (Chronic)    Blood pressure still running high.  He is on 10 mg amlodipine along with hydralazine 100 mg twice daily, low-dose Toprol and Imdur.  Plan: Convert from low-dose Toprol to carvedilol 6.25 mg twice daily Continue other medications. Still use PRN additional dose of hydralazine for elevated systolic pressures over 323 mmHg.      Relevant Medications   carvedilol (COREG) 6.25 MG tablet   Coronary artery disease involving native coronary artery of native heart with angina pectoris (HCC) (Chronic)    He had a second non-STEMI in November 2021-almost 10 years out from his initial presentation with DES PCI to the diagonal.  Most recent cath showed moderate ISR in the diagonal as well as moderate disease in the RCA in addition to severe disease in the circumflex OM-3 that was treated as a culprit lesion with DES PCI. Thankfully, he is not having any more chest pain or pressure with rest or exertion.  No real exertional dyspnea.  Preserved EF on echo.  Plan: With worsening blood pressure and heart rate in the 80s with some palpitation spells, convert from Toprol to carvedilol ->  DC Toprol, start carvedilol 6.25 mg twice daily Continue 10 mg amlodipine for blood pressure/antianginal effect. For now continue Imdur for additional antianginal effect, especially in light of upcoming surgery. For now on DAPT.  I have given clearance to holding both ASA and Brilinta for upcoming back surgery.      Relevant Medications   carvedilol (COREG) 6.25 MG tablet     Other   Preop cardiovascular exam    He has a very significant disc disease causing pain that limits his activity.  I need him back active.  At this point, he needs to proceed with surgery.  If he is going be  seen by the neurosurgery team in October, for them to schedule surgery would be late October and would be close enough to 1 year out from his PCI for him to hold Brilinta.  I am a little leery about the request for 10-day preop and 5-day postop hold, but as per discussion in PCI section, we need to be very careful with back surgeries.  He is not having any active angina symptoms, he is somewhat deconditioned now and probably cannot reach 4 metabolic once because of back pain, but prior to this he had been doing well with no angina symptoms.  He had recent revascularization and has not had recurrent chest pain despite having moderate disease elsewhere.  I would not recommend ischemic evaluation at this point in time as it would only serve to delay his surgery.  An abnormal stress test but that will need another cath and potentially another stent which starts the clock again on when we can stop Brilinta.  PREOPERATIVE CARDIAC RISK ASSESSMENT   Revised Cardiac Risk Index: High Risk Surgery: no; Back Sgx (unless Anterior Approach is not High Risk) Defined as Intraperitoneal, intrathoracic or suprainguinal vascular Active CAD: no; recent revascularization with no active angina -> but does have existing disease CHF: no; no CHF symptoms.  Preserved EF. Cerebrovascular Disease: no;  Diabetes: no; On Insulin: no CKD (Cr >~ 2): yes; Cr 2.2  Total: 1-2 Estimated Risk of Adverse Outcome: Class II-III Risk Estimated Risk of MI, PE, VF/VT (Cardiac Arrest), Complete Heart Block: 6-10% -> With no active CAD, 6% is more likely.  This is reduced by 50% due to standing dose of beta-blocker.   ACC/AHA Guidelines for "Clearance": Step 1 - Need for Emergency Surgery: No:  If Yes - go straight to OR with perioperative surveillance Step 2 - Active Cardiac Conditions (Unstable Angina, Decompensated HF, Significant  Arrhytmias - Complete HB, Mobitz II, Symptomatic VT or SVT, Severe Aortic Stenosis - mean gradient >  40 mmHg, Valve area < 1.0 cm2):  No:  If Yes - Evaluate & Treat per ACC/AHA Guidelines Step 3 -  Low Risk Surgery: No: Intermediate risk If Yes --> proceed to OR If No --> Step 4 Step 4 - Functional Capacity >= 4 METS without symptoms: Yes -> except now that he is limited by back pain. If Yes --> proceed to OR If No --> Step 5 Step 5 --  Clinical Risk Factors (CRF) see above (1-2) 1-2 or more CRFs: Yes If Yes -- assess Surgical Risk, --  (High Risk Non-cardiac), Intraabdominal or thoracic vascular surgery --> Proceed to OR, or consider testing if it will change management. Intermediate Risk: Proceed to OR with HR control, or consider testing if it will change management Based on the fact that he is asymptomatic and has had relatively recent revascularization, would not recommend further evaluation as  it would not change clinical management.  It would only serve to delay needed surgery.  It would be better to handle ischemic CAD issues postoperatively once he has had his back surgery as opposed to preop. He will need to hold Brilinta/aspirin per recommendation 10 days preop and 5 days postop which does provide increased risk, but at this time he will be almost 11 months post PCI which with new generation stents is less concerning than old generation stents. I am increasing his beta-blocker to help blood pressure and heart rate.  Stable beta-blocker dose along with Imdur both provide cardiovascular protection perioperatively.  Recommendation be to proceed to the OR when felt appropriate by the surgeons.  He will hold Brilinta and aspirin according to the recommendations. Continue current dose of carvedilol (started today) along with Imdur and amlodipine perioperatively.  Please not hesitate to contact cardiology perioperatively if there are any issues or concerns.      Palpitations - with exertion    He is a still there, but he is more noting palpitations related to pain.  Heart rate seems  better and blood pressure is higher.  Plan: Convert from Toprol 12.5 mg to carvedilol 6.25 mg twice daily      Abnormal nuclear stress test (Chronic)    We are planning on performing her Based on that abnormal stress test last year, but he ended up having non-STEMI over Thanksgiving while visiting family in [place]. There is found to have significant OM 3 lesion treated with DES PCI.  There was also ISR in the diagonal but that was not treated.  Since PCI, he has not had any further angina.  I would not recommend doing another stress test at this point in time since he is asymptomatic.  For now would not want to change medications which could potentially delay his necessary surgery.       ===================================  HPI:    George Erickson is a 50 y.o. male with a PMH notable for NSTEMI x2 (age 89-DES PCI D1, November 2021- DES PCI to LCx-OM3), with CRFs of HTN, HLD, CKD-3, mild obesity with OSA-CPAP who presents today for 19-month/preop follow-up..   11/2009: NSTEMI - DES PCI D1.  [facility]) Patent stent on relook cath 11/2010 02/06/2020 - NSTEMI: CARDIAC CATH -PCI ([facility]): Severe stenosis of LCx OM 3-DES PCI (discharged on aspirin Brilinta) D1-mid in-stent 70% restenosis; LCx-OM 3 proximal 80%, RCA mid 60 -65% => PCI OM 3-Synergy DES 2.5 mm x 20 mm (2.6 mm  Cristela Blue was last seen on May 17, 2020 -> he was doing relatively well.  Recovering from [place] infection-had a minor course.  No major issues.  Occasional anxiety with headaches and palpitations but well controlled.  No chest pain or pressure.  Occasional lightheadedness and may be easy fatigue at the end of the day.  Hands get cold and go numb.  No PND orthopnea edema.  No prolonged arrhythmias.  Just short burst of palpitations.    Recent Hospitalizations:  08/17/2020: ER visit for for back pain-bulging lumbar disc with left sided radiculopathy. 08/26/2020 acute right shoulder pain ER  visit  Reviewed  CV studies:    The following studies were reviewed today: (if available, images/films reviewed: From Epic Chart or Care Everywhere) None:  Interval History:   KALIQ LEGE returns here today really almost for preop evaluation.  He has been seen by neurosurgery because of his back pain that caused him to go to  the ER back in June.  He has significant disc disease and really needs surgical repair.  They are saying that he needs to hold his Brilinta for 10 days preop and maybe 5 days postop.  We initially said that that was not a good option at the time of his injury, but he now is starting to become more more limited.  He is not very active at all because the back pain just gets worse.  There is also question about potentially having a Lyme disease in the midst of all this. Accident with the back pain, his blood pressure and heart rate have definitely gone up.    He has short little spells where his heart will start racing and he feels a little twinging sensation in his chest but it goes away pretty quickly.  He is not having any exertional chest pain or pressure.  He says he still has a little short of breath episodes with difficulty catching his breath lasting a few seconds off and on.  This is similar to when he for started Brilinta.  Also since starting Brilinta he notes that he just feels cold all over.  CV Review of Symptoms (Summary) Cardiovascular ROS: positive for - palpitations, rapid heart rate, and off & on short bursts of rapid heart rates that are associated with a strange tingling sensation in the left upper chest.  Cold intolerance since starting Brilinta. ->  Still has occasional short of breath episodes associated with taking Brilinta. negative for - chest pain, dyspnea on exertion, edema, orthopnea, paroxysmal nocturnal dyspnea, or lightheadedness, dizziness or wooziness, syncope/near syncope or TIA/amaurosis fugax.  Claudication.  REVIEWED OF SYSTEMS    Review of Systems  Constitutional:  Negative for malaise/fatigue (Not really malaise or fatigue, just limited by back pain but not doing hardly anything.  He is quite deconditioned now.) and weight loss.  HENT:  Negative for congestion and nosebleeds.   Respiratory:  Positive for shortness of breath (Noted in HPI). Negative for cough.   Cardiovascular:        Per HPI  Gastrointestinal:  Negative for blood in stool, constipation and melena.  Genitourinary:  Negative for frequency and hematuria.  Musculoskeletal:  Positive for back pain (Initially had pain going down the left side, but now seems to be going on the right.) and joint pain (Right hip is starting to bother him some but it is may be radicular pain.). Negative for falls.  Neurological:  Positive for dizziness (Associated with pain), tingling (Right leg radicular pain) and focal weakness (Intermittent focal weakness down the right leg when he has radicular pain.).  Psychiatric/Behavioral:  Negative for depression and memory loss. The patient is nervous/anxious (Very anxious about his back pain and upcoming surgery..  Less frequent panic attacks.  Although I think this short-lived palpitation spells may be panic attacks.). The patient does not have insomnia.    I have reviewed and (if needed) personally updated the patient's problem list, medications, allergies, past medical and surgical history, social and family history.   PAST MEDICAL HISTORY   Past Medical History:  Diagnosis Date   Allergic rhinitis due to pollen    Arthritis    lower back   CAD S/P DES PCI D1 & OM3 11/15/2009   a) NSTEMI - 100% D1 (DES PCI - Promus DES 2.25 x 23 -- 2.3 mm); otherwise minimal CAD in a co-dominant system;; b) 02/06/20: OM3 80% (DES PCI), D1 70% ISR, mRCA 60-65%.    Chronic left  shoulder pain    Impingement syndrome   Erectile dysfunction    Gout    Hyperlipidemia with target LDL less than 70    Associated CAD   Hypertension     Hypertensive kidney disease with CKD stage IV (HCC)    Cr as of August 2021 1.92   Non-ST elevation (NSTEMI) myocardial infarction ([place]) 11/2009; 01/2020   a) 9/'11: Cath - 100% D1 --> DES PCI Promus 2.25 x 23 (2.3 mm).  EF 55% with anterolateral HK. -> patent stent in Sept 2012 & 2015; b) 02/05/2020: NSTEMI - Echo EF >55% no RWMA - 80% OM3 (DES PCI), 70% D1 ISR, 60-65% mRCA.   Obesity (BMI 30.0-34.9)    OSA on CPAP    uses cpap   Vertigo     PAST SURGICAL HISTORY   Past Surgical History:  Procedure Laterality Date   CORONARY STENT INTERVENTION  02/06/2020   [facility] [place], [place] -> Dr. Gretta Cool): NSTEMI: OM3 80% -> DES PCI: Synergy DES 2.5 x 20 - > 2.6 mm   CORONARY STENT PLACEMENT  11/15/2009   (Dr. Burt Knack) -- DES PCI of 100% D1 - Promus DES 2.25 x 23 --> 2.3 mm   HERNIA REPAIR     hernia repair at birth   LEFT HEART CATH AND CORONARY ANGIOGRAPHY  11/15/2009   (Dr. Burt Knack): NSTEMI -- LM-< LAD & LCx. ~mLAD ~30-40% just past D1 (D1 - 100% - CULPRIT - PCI), LCx normal - 2 OM & LPL. Dom RCA with Large PDA (small PL), ~30% mRCA.  EF ~55% with Ant-Lat HK    LEFT HEART CATH AND CORONARY ANGIOGRAPHY  11/22/2010   (Dr. Wynonia Lawman): Patent Diag 1 stent with minimal CAD elsewhere.  EF ~55-60% with Anterolateral HK   LEFT HEART CATH AND CORONARY ANGIOGRAPHY  12/31/2013   [facility] - for Abnormal Myoview) --> Non-obstructive CAD with widely patent D1 stent.  LVEDP ~16 mmHg   LEFT HEART CATH AND CORONARY ANGIOGRAPHY  02/06/2020   [facility], [place] -> Dr. Elsie Amis): NSTEMI: D1 70% mid stent ISR; OM3 80% (-> DES PCI), mRCA 60-65%.     NASAL SINUS SURGERY  2010   For sleep apnea   NM MYOVIEW LTD  12/05/2019   EF> 65%.  Fixed anterior perfusion defect with normal motion suggesting artifact.  Read as LOW RISK -> but NOT NORMAL   TM MYOVIEW  12/2013   (FALSE POSITIVE) + TM - lateral ST depressions (also noted with recovery - near syncope) -> Reversible  Anterior Wall Perfusion defect - CRO Ischemia; Partialy reversible Inferior defect - ~ diaphragmatic attenuation. EF ~48%. --> Referred for CATH --> FALSE POSTIIVE   TRANSTHORACIC ECHOCARDIOGRAM  07/08/2014   EF 60%. No RWMA. Mild LA dilation. Normal walves.   TRANSTHORACIC ECHOCARDIOGRAM  02/05/2020   [facility] [place], [place] -> Dr. Elsie Amis): NSTEMI: EF> 55%. No R WMA. Normal valves.    Immunization History  Administered Date(s) Administered   [employer] Vaccination 05/23/2019, 06/20/2019    MEDICATIONS/ALLERGIES   Current Meds  Medication Sig   amLODipine (NORVASC) 10 MG tablet Take 10 mg by mouth daily.   aspirin 81 MG chewable tablet    carvedilol (COREG) 6.25 MG tablet Take 1 tablet (6.25 mg total) by mouth 2 (two) times daily.   cholecalciferol (VITAMIN D3) 25 MCG (1000 UNIT) tablet Take 2,000 Units by mouth daily.   famotidine (PEPCID) 20 MG tablet Take 20 mg by mouth  at bedtime as needed.   gabapentin (NEURONTIN) 100 MG capsule Take 100 mg by mouth 3 (three) times daily.   hydrALAZINE (APRESOLINE) 100 MG tablet Take 1 tablet (100 mg total) by mouth 2 (two) times daily. May take an additional tablet if  Systolic blood pressure is greater than 150. (Patient taking differently: Take 100 mg by mouth 3 (three) times daily. May take an additional tablet if  Systolic blood pressure is greater than 150.)   isosorbide mononitrate (IMDUR) 30 MG 24 hr tablet Take 1 tablet by mouth once daily   montelukast (SINGULAIR) 10 MG tablet Take 10 mg by mouth daily.   nitroGLYCERIN (NITROSTAT) 0.4 MG SL tablet Place 1 tablet (0.4 mg total) under the tongue every 5 (five) minutes as needed for chest pain.   PROAIR RESPICLICK 109 (90 Base) MCG/ACT AEPB Inhale into the lungs.   SENNA CO Take 1 tablet by mouth daily.   ticagrelor (BRILINTA) 90 MG TABS tablet Take 1 tablet (90 mg total) by mouth 2 (two) times daily.   [DISCONTINUED] metoprolol succinate (TOPROL-XL) 25 MG 24  hr tablet Take 1 tablet by mouth daily. Take 1/2 Tablet Daily    Allergies  Allergen Reactions   Niacin And Related Itching   Zocor [Simvastatin-High Dose]     Muscle aches   Zocor [Simvastatin] Other (See Comments)    SOCIAL HISTORY/FAMILY HISTORY   Reviewed in Epic:  Pertinent findings:  Social History   Tobacco Use   Smoking status: Never   Smokeless tobacco: Never  Substance Use Topics   Alcohol use: Yes    Alcohol/week: 1.0 standard drink    Types: 1 Standard drinks or equivalent per week    Comment: Rare, Occasional glass of wine   Drug use: No   Social History   Social History Narrative   He is a married father of 2 girls.   Public relations account executive   Lifelong non-smoker.   Rare glass of wine   Minimal caffeine intake, no longer drinks coffee only drinks intermittent sweet tea.  Rarely would have a Sprite.      Walks daily at work, does not get routine exercise.    OBJCTIVE -PE, EKG, labs   Wt Readings from Last 3 Encounters:  10/21/20 244 lb (110.7 kg)  10/20/20 243 lb (110.2 kg)  05/17/20 243 lb 3.2 oz (110.3 kg)    Physical Exam: BP (!) 148/80 (BP Location: Left Arm, Patient Position: Sitting, Cuff Size: Large)   Pulse 82   Ht 5\' 11"  (1.803 m)   Wt 243 lb (110.2 kg)   BMI 33.89 kg/m  Physical Exam Vitals reviewed.  Constitutional:      General: He is not in acute distress (Just seems uncomfortable).    Appearance: Normal appearance. He is obese. He is ill-appearing (Seems uncomfortable.  He is in chronic back pain-> difficult getting comfortable seated). He is not toxic-appearing.  HENT:     Head: Normocephalic and atraumatic.  Neck:     Vascular: No carotid bruit or JVD.  Cardiovascular:     Rate and Rhythm: Normal rate and regular rhythm. No extrasystoles are present.    Chest Wall: PMI is not displaced.     Pulses: Normal pulses.     Heart sounds: S1 normal and S2 normal. Heart sounds are distant. No murmur heard.    No friction rub. No gallop.  Pulmonary:     Effort: Pulmonary effort is normal. No respiratory distress.     Breath  sounds: Normal breath sounds. No wheezing, rhonchi or rales.  Musculoskeletal:        General: No swelling. Normal range of motion.     Cervical back: Normal range of motion and neck supple.  Skin:    General: Skin is warm and dry.  Neurological:     General: No focal deficit present.     Mental Status: He is alert and oriented to person, place, and time.     Gait: Gait abnormal (Antalgic).  Psychiatric:        Mood and Affect: Mood normal.        Behavior: Behavior normal.        Thought Content: Thought content normal.        Judgment: Judgment normal.     Adult ECG Report Not checked  Recent Labs: Reviewed 10/13/2019: TC 167, HDL 41, LDL 105, TG 115.  Lab Results  Component Value Date   CREATININE 2.20 (H) 08/17/2020   BUN 25 (H) 08/17/2020   NA 139 08/17/2020   K 4.0 08/17/2020   CL 107 08/17/2020   CO2 25 08/17/2020   CBC Latest Ref Rng & Units 08/17/2020 03/27/2020 07/14/2014  WBC 4.0 - 10.5 K/uL 18.7(H) 16.2(H) 15.0(H)  Hemoglobin 13.0 - 17.0 g/dL 10.7(L) 13.2 9.1(L)  Hematocrit 39.0 - 52.0 % 29.7(L) 36.6(L) 25.2(L)  Platelets 150 - 400 K/uL 283 304 410(H)    Lab Results  Component Value Date   TSH 0.259 (L) 07/07/2014    ==================================================  COVID-19 Education: The signs and symptoms of COVID-19 were discussed with the patient and how to seek care for testing (follow up with PCP or arrange E-visit).    I spent a total of 23minutes with the patient spent in direct patient consultation.  Additional time spent with chart review  / charting (studies, outside notes, etc): 25 min Total Time: 5min  Current medicines are reviewed at length with the patient today.  (+/- concerns)  --?  Questions about holding Brilinta for surgery.  This visit occurred during the SARS-CoV-2 public health emergency.  Safety protocols were  in place, including screening questions prior to the visit, additional usage of staff PPE, and extensive cleaning of exam room while observing appropriate contact time as indicated for disinfecting solutions.  Notice: This dictation was prepared with Dragon dictation along with smaller phrase technology. Any transcriptional errors that result from this process are unintentional and may not be corrected upon review.  Patient Instructions / Medication Changes & Studies & Tests Ordered   Patient Instructions  Medication Instructions:  STOP TAKING METOPROLOL SUCCINATE THEN   START TAKING CARVEDILOL 6.25 MG ONE TABLET TWICE A DAY    *If you need a refill on your cardiac medications before your next appointment, please call your pharmacy*   Lab Work:  NOT NEEDED   Testing/Procedures:  NOT NEEDED  Follow-Up: At [facility], you and your health needs are our priority.  As part of our continuing mission to provide you with exceptional heart care, we have created designated Provider Care Teams.  These Care Teams include your primary Cardiologist (physician) and Advanced Practice Providers (APPs -  Physician Assistants and Nurse Practitioners) who all work together to provide you with the care you need, when you need it.     Your next appointment:   5 month(s)  The format for your next appointment:   In Person  Provider:   Glenetta Hew, MD   Other Instructions   OKAY FOR BACK SURGERY FROM  A CARDIAC STANDPOINT- OKAY TO HOLD BRILINTA , ASPIRIN 10 DAYS PRIOR AND 5 DAY AFTER  SURGERY .   Studies Ordered:   No orders of the defined types were placed in this encounter.    Glenetta Hew, M.D., M.S. Interventional Cardiologist   Pager # [phone] Phone # [phone] [address] [place], [place] [address]   Thank you for choosing [facility] at [facility]!!

## 2020-10-21 ENCOUNTER — Telehealth: Payer: Self-pay

## 2020-10-21 ENCOUNTER — Encounter: Payer: Self-pay | Admitting: Cardiology

## 2020-10-21 ENCOUNTER — Encounter: Payer: Self-pay | Admitting: Nurse Practitioner

## 2020-10-21 ENCOUNTER — Ambulatory Visit (INDEPENDENT_AMBULATORY_CARE_PROVIDER_SITE_OTHER): Payer: BC Managed Care – PPO | Admitting: Nurse Practitioner

## 2020-10-21 VITALS — Ht 71.0 in | Wt 244.0 lb

## 2020-10-21 DIAGNOSIS — Z1211 Encounter for screening for malignant neoplasm of colon: Secondary | ICD-10-CM | POA: Diagnosis not present

## 2020-10-21 DIAGNOSIS — Z7901 Long term (current) use of anticoagulants: Secondary | ICD-10-CM | POA: Diagnosis not present

## 2020-10-21 DIAGNOSIS — K5900 Constipation, unspecified: Secondary | ICD-10-CM | POA: Diagnosis not present

## 2020-10-21 MED ORDER — NA SULFATE-K SULFATE-MG SULF 17.5-3.13-1.6 GM/177ML PO SOLN: 1.0000 | Freq: Once | ORAL | 0 refills | Status: AC

## 2020-10-21 NOTE — Patient Instructions (Signed)
If you are age 50 or younger, your body mass index should be between 19-25. Your Body mass index is 34.03 kg/m. If this is out of the aformentioned range listed, please consider follow up with your Primary Care Provider.  __________________________________________________________  The [facility] providers would like to encourage you to use [facility] to communicate with providers for non-urgent requests or questions.  Due to long hold times on the telephone, sending your provider a message by [facility] may be a faster and more efficient way to get a response.  Please allow 48 business hours for a response.  Please remember that this is for non-urgent requests.   You have been scheduled for a colonoscopy. Please follow written instructions given to you at your visit today.  Please pick up your prep supplies at the pharmacy within the next 1-3 days. If you use inhalers (even only as needed), please bring them with you on the day of your procedure.  You will be contacted by our office prior to your procedure for directions on holding your Brilinta.  If you do not hear from our office 1 week prior to your scheduled procedure, please call [phone] to discuss.   Follow up pending the results of your Colonoscopy or as needed.  Thank you for entrusting me with your care and choosing [facility].  Tye Savoy, NP-C

## 2020-10-21 NOTE — Progress Notes (Signed)
ASSESSMENT AND PLAN    # Colon cancer screening. This will be his first screening.  --The risks and benefits of colonoscopy with possible polypectomy / biopsies were discussed and the patient agrees to proceed.   # CAD / NSTEMI / DES.  Patient is nearly 9 months out from his NSTEMI / DES. He is on Brillinta + ASA. No chest pains. Had Cardiology follow up yesterday.  EF greater than 55% on echo in November 2021 --[place] for 5 days before procedure - will instruct when and how to resume after procedure. Patient understands that there is a low but real risk of cardiovascular event such as heart attack, stroke, or embolism /  thrombosis, or ischemia while off Brillinta. The patient consents to proceed. Will communicate by phone or EMR with patient's prescribing provider to confirm that holding Brillinta is reasonable in this case. --Of note, patient saw Cardiology yesterday . He was given permission to hold Brilinta and aspirin 10 days prior and 5 days after upcoming back surgery.   #Constipation.  Symptoms started approximately 6 months ago but patient feels like it was related to medications that he was taking for back pain including Percocet and hydrocodone.  Constipation has resolved since being off narcotics and starting Senokot with a stool softener  #GERD.  Patient gives a 1.5-year history of heartburn.  He has been taking famotidine at bedtime with good control of symptoms.  No dysphagia  # Chronic leukocytosis, ?  Etiology.  It does not seem like he has ever been evaluated  #Chronic ( years) [place] anemia.  Baseline hemoglobin 9-10 range except in January of this year it was 13.2.  Anemia probably related to CKD  # CKD, based on recent labs he would be CKD 3   HISTORY OF PRESENT ILLNESS     Chief Complaint : Colon cancer screening and constipation  George Erickson is a 50 y.o. male with a past medical history significant for CAD / NSTEMI November 2021 s/p DES,  hyperlipidemia,  hypertension, CKD3, OSA-CPAP, Lyme's disease. See PMH below for any additional history.   Patient referred by PCP for colon cancer screening and constipation   Patient gives a 2-month history of constipation which he believes is secondary to pain medications that he was taking for his back.  He was taking Percocet and hydrocodone which were discontinued a couple months ago.  His constipation has since been improving off narcotics and with the addition of senna plus a stool softener.  Patient has never had a screening colonoscopy.  He has no family history of colon cancer.  He has not had any blood in his stool.  No abdominal pain or unusual weight loss.  He has chronic anemia.   Patient has CAD and underwent DES for an NSTEMI in July 2021.  He is on Brilinta and aspirin. Followed by Dr. Ellyn Hack.  No recent chest pain/shortness of breath  Data Reviewed:  November 2021 2D echo -EF greater than 55%  08/17/20 WBC 18.7, hemoglobin 10.7, MCV 84.6, platelets 283 Creatinine 2.2, GFR 36   PREVIOUS GI EVALUATIONS:   none    Past Medical History:  Diagnosis Date   Allergic rhinitis due to pollen    Arthritis    lower back   CAD S/P DES PCI D1 11/15/2009   a) NSTEMI - 100% D1 (DES PCI - Promus DES 2.25 x 23 -- 2.3 mm); otherwise minimal CAD in a co-dominant system;; b) 02/06/20: OM3 80% (DES  PCI), D1 70% ISR, mRCA 60-65%.    Chronic left shoulder pain    Impingement syndrome   Erectile dysfunction    Gout    Hyperlipidemia with target LDL less than 70    Associated CAD   Hypertension    Hypertensive kidney disease with CKD stage IV (HCC)    Cr as of August 2021 1.92   Non-ST elevation (NSTEMI) myocardial infarction ([place]) 11/2009; 01/2020   a) 9/'11: Cath - 100% D1 --> DES PCI Promus 2.25 x 23 (2.3 mm).  EF 55% with anterolateral HK. -> patent stent in Sept 2012 & 2015; b) 02/05/2020: NSTEMI - Echo EF >55% no RWMA - 80% OM3 (DES PCI), 70% D1 ISR, 60-65% mRCA.   Obesity  (BMI 30.0-34.9)    OSA on CPAP    uses cpap   Vertigo      Past Surgical History:  Procedure Laterality Date   CORONARY STENT INTERVENTION  02/06/2020   [facility], [place] -> Dr. Gretta Cool): NSTEMI: OM3 80% -> DES PCI: Synergy DES 2.5 x 20 - > 2.6 mm   CORONARY STENT PLACEMENT  11/15/2009   (Dr. Burt Knack) -- DES PCI of 100% D1 - Promus DES 2.25 x 23 --> 2.3 mm   HERNIA REPAIR     hernia repair at birth   LEFT HEART CATH AND CORONARY ANGIOGRAPHY  11/15/2009   (Dr. Burt Knack): NSTEMI -- LM-< LAD & LCx. ~mLAD ~30-40% just past D1 (D1 - 100% - CULPRIT - PCI), LCx normal - 2 OM & LPL. Dom RCA with Large PDA (small PL), ~30% mRCA.  EF ~55% with Ant-Lat HK    LEFT HEART CATH AND CORONARY ANGIOGRAPHY  11/22/2010   (Dr. Wynonia Lawman): Patent Diag 1 stent with minimal CAD elsewhere.  EF ~55-60% with Anterolateral HK   LEFT HEART CATH AND CORONARY ANGIOGRAPHY  12/31/2013   [facility] - for Abnormal Myoview) --> Non-obstructive CAD with widely patent D1 stent.  LVEDP ~16 mmHg   LEFT HEART CATH AND CORONARY ANGIOGRAPHY  02/06/2020   [place], [place] -> Dr. Elsie Amis): NSTEMI: D1 70% mid stent ISR; OM3 80% (-> DES PCI), mRCA 60-65%.     NASAL SINUS SURGERY  2010   For sleep apnea   NM MYOVIEW LTD  12/05/2019   EF> 65%.  Fixed anterior perfusion defect with normal motion suggesting artifact.  Read as LOW RISK -> but NOT NORMAL   TM MYOVIEW  12/2013   (FALSE POSITIVE) + TM - lateral ST depressions (also noted with recovery - near syncope) -> Reversible Anterior Wall Perfusion defect - CRO Ischemia; Partialy reversible Inferior defect - ~ diaphragmatic attenuation. EF ~48%. --> Referred for CATH --> FALSE POSTIIVE   TRANSTHORACIC ECHOCARDIOGRAM  07/08/2014   EF 60%. No RWMA. Mild LA dilation. Normal walves.   TRANSTHORACIC ECHOCARDIOGRAM  02/05/2020   [facility] [place], [place] -> Dr. Elsie Amis): NSTEMI: EF> 55%. No R WMA. Normal valves.    Family History  Problem Relation Age of Onset   Hypertension Mother    Coronary artery disease Mother 89       s/p CABG   Diabetes Mellitus II Mother    Hyperlipidemia Mother    Hypertension Father    Heart attack Father 70       Had several after   Diabetes Mellitus II Father    Heart failure Father    CVA Father 56   Hyperlipidemia Father    CVA  Sister    Hypothyroidism Sister    Hypertension Sister    Healthy Brother    Healthy Brother    Hypertension Sister    Heart attack Sister    Social History   Tobacco Use   Smoking status: Never   Smokeless tobacco: Never  Substance Use Topics   Alcohol use: Yes    Alcohol/week: 1.0 standard drink    Types: 1 Standard drinks or equivalent per week    Comment: Rare, Occasional glass of wine   Drug use: No   Current Outpatient Medications  Medication Sig Dispense Refill   amLODipine (NORVASC) 10 MG tablet Take 10 mg by mouth daily.     aspirin 81 MG chewable tablet      carvedilol (COREG) 6.25 MG tablet Take 1 tablet (6.25 mg total) by mouth 2 (two) times daily. 180 tablet 3   cholecalciferol (VITAMIN D3) 25 MCG (1000 UNIT) tablet Take 2,000 Units by mouth daily.     famotidine (PEPCID) 20 MG tablet Take 20 mg by mouth at bedtime as needed.     gabapentin (NEURONTIN) 100 MG capsule Take 100 mg by mouth 3 (three) times daily.     hydrALAZINE (APRESOLINE) 100 MG tablet Take 1 tablet (100 mg total) by mouth 2 (two) times daily. May take an additional tablet if  Systolic blood pressure is greater than 150. (Patient taking differently: Take 100 mg by mouth 3 (three) times daily. May take an additional tablet if  Systolic blood pressure is greater than 150.) 270 tablet 3   isosorbide mononitrate (IMDUR) 30 MG 24 hr tablet Take 1 tablet by mouth once daily 90 tablet 2   montelukast (SINGULAIR) 10 MG tablet Take 10 mg by mouth daily.     nitroGLYCERIN (NITROSTAT) 0.4 MG SL tablet Place 1 tablet (0.4 mg total) under the tongue every 5  (five) minutes as needed for chest pain. 25 tablet 6   PROAIR RESPICLICK 962 (90 Base) MCG/ACT AEPB Inhale into the lungs.     SENNA CO Take 1 tablet by mouth daily.     ticagrelor (BRILINTA) 90 MG TABS tablet Take 1 tablet (90 mg total) by mouth 2 (two) times daily. 180 tablet 3   pravastatin (PRAVACHOL) 40 MG tablet Take 1 tablet (40 mg total) by mouth every evening. 90 tablet 3   No current facility-administered medications for this visit.   Allergies  Allergen Reactions   Niacin And Related Itching   Zocor [Simvastatin-High Dose]     Muscle aches   Zocor [Simvastatin] Other (See Comments)     Review of Systems: Positive for back pain.  All other systems reviewed and negative except where noted in HPI.    PHYSICAL EXAM :    Wt Readings from Last 3 Encounters:  10/21/20 244 lb (110.7 kg)  10/20/20 243 lb (110.2 kg)  05/17/20 243 lb 3.2 oz (110.3 kg)    Ht 5\' 11"  (1.803 m)   Wt 244 lb (110.7 kg)   BMI 34.03 kg/m  Constitutional:  Pleasant male in no acute distress. Psychiatric: Normal mood and affect. Behavior is normal. EENT: Pupils normal.  Conjunctivae are normal. No scleral icterus. Neck supple.  Cardiovascular: Normal rate, regular rhythm. No edema Pulmonary/chest: Effort normal and breath sounds normal. No wheezing, rales or rhonchi. Abdominal: Soft, nondistended, nontender. Bowel sounds active throughout. There are no masses palpable. No hepatomegaly. Neurological: Alert and oriented to person place and time. Skin: Skin is warm and dry. No rashes noted.  Tye Savoy, NP  10/21/2020, 10:10 AM  Cc:  Referring Provider Johnathen Small, MD

## 2020-10-21 NOTE — Telephone Encounter (Signed)
   Patient Name: George Erickson  DOB: 1970/11/23 MRN: [id number]  Primary Cardiologist: Glenetta Hew, MD  Chart reviewed as part of pre-operative protocol coverage. Patient was just seen yesterday by Dr. Ellyn Hack for unrelated surgical clearance, o finalized ffice note pending. Will route to Dr. Ellyn Hack to clarify if patient may proceed with colonoscopy as requested as well as to hold Brilinta starting 5 days prior. Dr. Ellyn Hack - Please route response to P CV DIV PREOP (the pre-op pool). Thank you.   Charlie Pitter, PA-C 10/21/2020, 4:53 PM

## 2020-10-21 NOTE — Telephone Encounter (Signed)
[facility] Pre-operative Risk Assessment     Request for surgical clearance:     Endoscopy Procedure  What type of surgery is being performed?     Colonoscopy  When is this surgery scheduled?     12/01/2020  What type of clearance is required ?   Pharmacy  Are there any medications that need to be held prior to surgery and how long? Brillinta starting 5 days prior  Practice name and name of physician performing surgery?      [facility]  What is your office phone and fax number?      Phone- [phone]  [phone]  Anesthesia type (None, local, MAC, general) ?       MAC

## 2020-10-22 ENCOUNTER — Telehealth: Payer: Self-pay

## 2020-10-22 ENCOUNTER — Encounter: Payer: Self-pay | Admitting: Cardiology

## 2020-10-22 DIAGNOSIS — Z01810 Encounter for preprocedural cardiovascular examination: Secondary | ICD-10-CM | POA: Insufficient documentation

## 2020-10-22 NOTE — Progress Notes (Signed)
____________________________________________________________  Attending physician addendum:  Thank you for sending this case to me. I have reviewed the entire note and agree with the plan.  Please be sure to send your note to his PCP and recommend a Hematology evaluation.  Wilfrid Lund, MD  ____________________________________________________________

## 2020-10-22 NOTE — Assessment & Plan Note (Addendum)
He had a second non-STEMI in November 2021-almost 10 years out from his initial presentation with DES PCI to the diagonal.  Most recent cath showed moderate ISR in the diagonal as well as moderate disease in the RCA in addition to severe disease in the circumflex OM-3 that was treated as a culprit lesion with DES PCI. Thankfully, he is not having any more chest pain or pressure with rest or exertion.  No real exertional dyspnea.  Preserved EF on echo.  Plan:  With worsening blood pressure and heart rate in the 80s with some palpitation spells, convert from Toprol to carvedilol -> DC Toprol, start carvedilol 6.25 mg twice daily  Continue 10 mg amlodipine for blood pressure/antianginal effect.  For now continue Imdur for additional antianginal effect, especially in light of upcoming surgery.  For now on DAPT.  I have given clearance to holding both ASA and Brilinta for upcoming back surgery.

## 2020-10-22 NOTE — Telephone Encounter (Signed)
Faxed to Dr Justin Mend.

## 2020-10-22 NOTE — Assessment & Plan Note (Signed)
He is other recently had labs checked or is due to have labs checked soon by his PCP to follow-up his lipids. Remains on pravastatin.  If not maintaining goal, would have low threshold to convert to rosuvastatin.  Will be on look out for lab results from PCP.Marland Kitchen

## 2020-10-22 NOTE — Assessment & Plan Note (Signed)
Blood pressure still running high.  He is on 10 mg amlodipine along with hydralazine 100 mg twice daily, low-dose Toprol and Imdur.  Plan: Convert from low-dose Toprol to carvedilol 6.25 mg twice daily Continue other medications. Still use PRN additional dose of hydralazine for elevated systolic pressures over 982 mmHg.

## 2020-10-22 NOTE — Telephone Encounter (Signed)
   Patient Name: George Erickson  DOB: 28-Mar-1970 MRN: [id number]  Primary Cardiologist: Glenetta Hew, MD  Chart reviewed as part of pre-operative protocol coverage. Pre-op clearance already addressed by colleagues in earlier phone notes. To summarize recommendations:  - Dr. Ellyn Hack just saw patient yesterday and cleared him for an unrelated procedure -- upcoming back surgery -- therefore he is medically cleared for this colonoscopy as well.  - Regarding holding Brilinta, Dr. Ellyn Hack would prefer that his colonoscopy be scheduled during the 10 days that he will be holding his Brilinta for back surgery.  Will route this bundled recommendation to requesting provider via Epic fax function and remove from pre-op pool. Please call with questions.  Charlie Pitter, PA-C 10/22/2020, 3:24 PM

## 2020-10-22 NOTE — Assessment & Plan Note (Signed)
   Remains on DAPT.  The goal will be for him to be uninterrupted until end of November 2022, however he is relatively urgent need for surgery likely in October.  By then he was almost a year out and is probably okay to hold Brilinta.  Preferably, this would be for 7 days, but with it being a spinal surgery, will defer to the surgeons discretion.  He was requested a 10-day hold preop and 5 days postop. --> I did mention to George Erickson that this is somewhat concerning, but the father out we get from his PCI, the more safe it would be.  I think he needs to have a surgery and therefore understand the risk, he will hold aspirin and wanted to 10 days preop and 5 days postop back surgery.  He would not restart to continue 90 mg twice daily Brilinta through the end of November. ->  Going forward we would then DC aspirin and switch to either maintenance dose 60 mg twice daily Brilinta versus clopidogrel 75 mg daily partially due to the cost and partially because of his dyspnea issues.)

## 2020-10-22 NOTE — Telephone Encounter (Signed)
I just completed  this note for George Erickson related to potential Back sgx in Oct.    Now I see that GI wants to do C-Scope in Sept -- would prefer that this be scheduled during the 10 days that he will be holding his Brilinta Pre-Op for Back Sgx.    Glenetta Hew, MD

## 2020-10-22 NOTE — Assessment & Plan Note (Signed)
He has a very significant disc disease causing pain that limits his activity.  I need him back active.  At this point, he needs to proceed with surgery.  If he is going be seen by the neurosurgery team in October, for them to schedule surgery would be late October and would be close enough to 1 year out from his PCI for him to hold Brilinta.  I am a little leery about the request for 10-day preop and 5-day postop hold, but as per discussion in PCI section, we need to be very careful with back surgeries.  He is not having any active angina symptoms, he is somewhat deconditioned now and probably cannot reach 4 metabolic once because of back pain, but prior to this he had been doing well with no angina symptoms.  He had recent revascularization and has not had recurrent chest pain despite having moderate disease elsewhere.  I would not recommend ischemic evaluation at this point in time as it would only serve to delay his surgery.  An abnormal stress test but that will need another cath and potentially another stent which starts the clock again on when we can stop Brilinta.  PREOPERATIVE CARDIAC RISK ASSESSMENT   Revised Cardiac Risk Index:  High Risk Surgery: no; Back Sgx (unless Anterior Approach is not High Risk)  Defined as Intraperitoneal, intrathoracic or suprainguinal vascular  Active CAD: no; recent revascularization with no active angina -> but does have existing disease  CHF: no; no CHF symptoms.  Preserved EF.  Cerebrovascular Disease: no;   Diabetes: no; On Insulin: no  CKD (Cr >~ 2): yes; Cr 2.2  Total: 1-2 Estimated Risk of Adverse Outcome: Class II-III Risk Estimated Risk of MI, PE, VF/VT (Cardiac Arrest), Complete Heart Block: 6-10% -> With no active CAD, 6% is more likely.  This is reduced by 50% due to standing dose of beta-blocker.   ACC/AHA Guidelines for "Clearance":  Step 1 - Need for Emergency Surgery: No:   If Yes - go straight to OR with perioperative  surveillance  Step 2 - Active Cardiac Conditions (Unstable Angina, Decompensated HF, Significant  Arrhytmias - Complete HB, Mobitz II, Symptomatic VT or SVT, Severe Aortic Stenosis - mean gradient > 40 mmHg, Valve area < 1.0 cm2):   No:   If Yes - Evaluate & Treat per ACC/AHA Guidelines  Step 3 -  Low Risk Surgery: No: Intermediate risk  If Yes --> proceed to OR  If No --> Step 4  Step 4 - Functional Capacity >= 4 METS without symptoms: Yes -> except now that he is limited by back pain.  If Yes --> proceed to OR  If No --> Step 5  Step 5 --  Clinical Risk Factors (CRF) see above (1-2)  1-2 or more CRFs: Yes  If Yes -- assess Surgical Risk, --   (High Risk Non-cardiac), Intraabdominal or thoracic vascular surgery --> Proceed to OR, or consider testing if it will change management.  Intermediate Risk: Proceed to OR with HR control, or consider testing if it will change management  Based on the fact that he is asymptomatic and has had relatively recent revascularization, would not recommend further evaluation as it would not change clinical management.  It would only serve to delay needed surgery.  It would be better to handle ischemic CAD issues postoperatively once he has had his back surgery as opposed to preop.  He will need to hold Brilinta/aspirin per recommendation 10 days preop and 5 days postop  which does provide increased risk, but at this time he will be almost 11 months post PCI which with new generation stents is less concerning than old generation stents.  I am increasing his beta-blocker to help blood pressure and heart rate.  Stable beta-blocker dose along with Imdur both provide cardiovascular protection perioperatively.  Recommendation be to proceed to the OR when felt appropriate by the surgeons.  He will hold Brilinta and aspirin according to the recommendations. Continue current dose of carvedilol (started today) along with Imdur and amlodipine  perioperatively.  Please not hesitate to contact cardiology perioperatively if there are any issues or concerns.

## 2020-10-22 NOTE — Assessment & Plan Note (Signed)
We are planning on performing her Based on that abnormal stress test last year, but he ended up having non-STEMI over Thanksgiving while visiting family in [place]. There is found to have significant OM 3 lesion treated with DES PCI.  There was also ISR in the diagonal but that was not treated.  Since PCI, he has not had any further angina.  I would not recommend doing another stress test at this point in time since he is asymptomatic.  For now would not want to change medications which could potentially delay his necessary surgery.

## 2020-10-22 NOTE — Assessment & Plan Note (Signed)
He is a still there, but he is more noting palpitations related to pain.  Heart rate seems better and blood pressure is higher.  Plan: Convert from Toprol 12.5 mg to carvedilol 6.25 mg twice daily

## 2020-10-22 NOTE — Telephone Encounter (Signed)
-----   Message from Willia Craze, NP sent at 10/22/2020  4:42 PM EDT ----- Eustaquio Maize,  When you get a chance will you send a copy of this note to patient's PCP.  I tried to forward it but I do not think she is in epic.  Please make sure the note includes Dr. Corena Pilgrim recommendations about the hematology consult.  Thanks

## 2020-10-24 ENCOUNTER — Other Ambulatory Visit: Payer: Self-pay | Admitting: Cardiology

## 2020-10-26 DIAGNOSIS — Z5181 Encounter for therapeutic drug level monitoring: Secondary | ICD-10-CM | POA: Diagnosis not present

## 2020-10-26 DIAGNOSIS — I10 Essential (primary) hypertension: Secondary | ICD-10-CM | POA: Diagnosis not present

## 2020-10-26 DIAGNOSIS — Z125 Encounter for screening for malignant neoplasm of prostate: Secondary | ICD-10-CM | POA: Diagnosis not present

## 2020-10-26 DIAGNOSIS — E7800 Pure hypercholesterolemia, unspecified: Secondary | ICD-10-CM | POA: Diagnosis not present

## 2020-10-26 DIAGNOSIS — Z0000 Encounter for general adult medical examination without abnormal findings: Secondary | ICD-10-CM | POA: Diagnosis not present

## 2020-10-26 DIAGNOSIS — N184 Chronic kidney disease, stage 4 (severe): Secondary | ICD-10-CM | POA: Diagnosis not present

## 2020-10-26 NOTE — Telephone Encounter (Signed)
Called and spoke wit patient about recommendations made by Dr. Ellyn Hack. He will hold his Brilinta starting 10 days prior to his back surgery. I have pushed his Colonoscopy date to 12/21/2020 to accommodate the window of being off the Brilinta 5 days prior to his colonoscopy, and allowing the back surgery to be done before his back surgery. He will call the office if we need to change the date once he sees Ortho on 10/3.

## 2020-11-01 ENCOUNTER — Telehealth: Payer: Self-pay

## 2020-11-01 NOTE — Telephone Encounter (Signed)
-----   Message from Willia Craze, NP sent at 11/01/2020  2:37 PM EDT ----- Jamas Lav, please call patient and cancel his colonoscopy. Cardiology wants [place] to do the colonoscopy while patient is off brillinta for back surgery instead of holding brillinta a separate time for colonoscopy. Having back surgery and a colonoscopy so close together is a lot to take on. Colonoscopy is not urgent. Please arrange for a follow up office appointment with me or Dr. Loletha Carrow in about 4-6 months to talk about getting rescheduled. Hopefully by then he will have recovered from back surgery. Also, he will be a year out from stent placement so hopefully Cardiology will let us hold Brillinta. Thanks

## 2020-11-01 NOTE — Telephone Encounter (Signed)
Patient advised. Recall placed. °

## 2020-11-04 DIAGNOSIS — I129 Hypertensive chronic kidney disease with stage 1 through stage 4 chronic kidney disease, or unspecified chronic kidney disease: Secondary | ICD-10-CM | POA: Diagnosis not present

## 2020-11-04 DIAGNOSIS — N2581 Secondary hyperparathyroidism of renal origin: Secondary | ICD-10-CM | POA: Diagnosis not present

## 2020-11-04 DIAGNOSIS — D631 Anemia in chronic kidney disease: Secondary | ICD-10-CM | POA: Diagnosis not present

## 2020-11-04 DIAGNOSIS — N1832 Chronic kidney disease, stage 3b: Secondary | ICD-10-CM | POA: Diagnosis not present

## 2020-11-04 DIAGNOSIS — N189 Chronic kidney disease, unspecified: Secondary | ICD-10-CM | POA: Diagnosis not present

## 2020-12-01 ENCOUNTER — Encounter: Payer: BC Managed Care – PPO | Admitting: Gastroenterology

## 2020-12-13 NOTE — Telephone Encounter (Signed)
Sounds like a good plan

## 2020-12-16 DIAGNOSIS — M5126 Other intervertebral disc displacement, lumbar region: Secondary | ICD-10-CM | POA: Diagnosis not present

## 2020-12-16 DIAGNOSIS — M5416 Radiculopathy, lumbar region: Secondary | ICD-10-CM | POA: Diagnosis not present

## 2020-12-17 ENCOUNTER — Telehealth: Payer: Self-pay

## 2020-12-17 DIAGNOSIS — G4733 Obstructive sleep apnea (adult) (pediatric): Secondary | ICD-10-CM | POA: Diagnosis not present

## 2020-12-17 NOTE — Telephone Encounter (Signed)
   [facility] Pre-operative Risk Assessment    Patient Name: George Erickson  DOB: Dec 17, 1970 MRN: [id number]   Request for surgical clearance:  What type of surgery is being performed  Redo Lumbar Microdiscectomy   When is this surgery scheduled TBD  What type of clearance is required Both  Are there any medications that need to be held prior to surgery and how long   Brilinta and Aspirin  Practice name and name of physician performing surgery [facility]  Dr.Troy Dawley  What is the office phone number               [phone]   7.   What is the office fax number        [phone]  8.   Anesthesia type General   Kathyrn Lass 12/17/2020, 10:12 AM  _________________________________________________________________   (provider comments below)

## 2020-12-21 ENCOUNTER — Other Ambulatory Visit: Payer: Self-pay | Admitting: Neurological Surgery

## 2020-12-21 ENCOUNTER — Encounter: Payer: BC Managed Care – PPO | Admitting: Gastroenterology

## 2020-12-21 NOTE — Telephone Encounter (Signed)
   Name: George Erickson  DOB: 1971/02/02  MRN: [id number]   Primary Cardiologist: Glenetta Hew, MD  Chart reviewed as part of pre-operative protocol coverage. Patient was contacted 12/21/2020 in reference to pre-operative risk assessment for pending surgery as outlined below.  George Erickson was last seen on 10/20/2020 by Dr. Ellyn Hack.  Since that day, George Erickson has done well.  Patient was previously seen and cleared for the surgery yet October by Dr. Ellyn Hack on 10/20/2020.  According to the office note, "OKAY FOR BACK SURGERY FROM A CARDIAC STANDPOINT- OKAY TO HOLD BRILINTA , ASPIRIN 10 DAYS PRIOR AND 5 DAY AFTER  SURGERY ."  Therefore, based on ACC/AHA guidelines, the patient would be at acceptable risk for the planned procedure without further cardiovascular testing.   The patient was advised that if he develops new symptoms prior to surgery to contact our office to arrange for a follow-up visit, and he verbalized understanding.  I will route this recommendation to the requesting party via Epic fax function and remove from pre-op pool. Please call with questions.  [place], [place] 12/21/2020, 12:35 PM

## 2020-12-28 NOTE — Progress Notes (Addendum)
Surgical Instructions    Your procedure is scheduled on Friday October 28.  Report to [facility] Main Entrance "A" at 5:30 A.M., then check in with the Admitting office.  Call this number if you have problems the morning of surgery:  [phone]   If you have any questions prior to your surgery date call [phone]: Open Monday-Friday 8am-4pm    Remember:  Do not eat or drink anything after midnight the night before your surgery     Take these medicines the morning of surgery with A SIP OF WATER:  amLODipine (NORVASC)  carvedilol (COREG) cyclobenzaprine (FLEXERIL)if needed gabapentin (NEURONTIN)  hydrALAZINE (APRESOLINE) isosorbide mononitrate  nitroGLYCERIN (NITROSTAT) if needed PROAIR RESPICLICK 387 if needed   As of today, STOP taking any ASPIRIN and BRILINTA (unless otherwise instructed by your surgeon) Aleve, Naproxen, Ibuprofen, Motrin, Advil, Goody's, BC's, all herbal medications, fish oil, and all vitamins.    After your COVID test   You are not required to quarantine however you are required to wear a well-fitting mask when you are out and around people not in your household.  If your mask becomes wet or soiled, replace with a new one.  Wash your hands often with soap and water for 20 seconds or clean your hands with an alcohol-based hand sanitizer that contains at least 60% alcohol.  Do not share personal items.  Notify your provider: if you are in close contact with someone who has COVID  or if you develop a fever of 100.4 or greater, sneezing, cough, sore throat, shortness of breath or body aches.             Do not wear jewelry or makeup Do not wear lotions, powders, perfumes/colognes, or deodorant. Do not shave 48 hours prior to surgery.  Men may shave face and neck. Do not bring valuables to the hospital. DO Not wear nail polish, gel polish, artificial nails, or any other type of covering on natural nails including finger and toenails. If patients  have artificial nails, gel coating, etc. that need to be removed by a nail salon, please have this removed prior to surgery or surgery may need to be canceled/delayed if the surgeon/ anesthesia feels like the patient is unable to be adequately monitored.             [facility] is not responsible for any belongings or valuables.  Do NOT Smoke (Tobacco/Vaping)  24 hours prior to your procedure  If you use a CPAP at night, you may bring your mask for your overnight stay.   Contacts, glasses, hearing aids, dentures or partials may not be worn into surgery, please bring cases for these belongings   For patients admitted to the hospital, discharge time will be determined by your treatment team.   Patients discharged the day of surgery will not be allowed to drive home, and someone needs to stay with them for 24 hours.  NO VISITORS WILL BE ALLOWED IN PRE-OP WHERE PATIENTS ARE PREPPED FOR SURGERY.  ONLY 1 SUPPORT PERSON MAY BE PRESENT IN THE WAITING ROOM WHILE YOU ARE IN SURGERY.  IF YOU ARE TO BE ADMITTED, ONCE YOU ARE IN YOUR ROOM YOU WILL BE ALLOWED TWO (2) VISITORS. 1 (ONE) VISITOR MAY STAY OVERNIGHT BUT MUST ARRIVE TO THE ROOM BY 8pm.  Minor children may have two parents present. Special consideration for safety and communication needs will be reviewed on a case by case basis.  Special instructions:    Oral Hygiene is also  important to reduce your risk of infection.  Remember - BRUSH YOUR TEETH THE MORNING OF SURGERY WITH YOUR REGULAR TOOTHPASTE   [facility]- Preparing For Surgery  Before surgery, you can play an important role. Because skin is not sterile, your skin needs to be as free of germs as possible. You can reduce the number of germs on your skin by washing with CHG (chlorahexidine gluconate) Soap before surgery.  CHG is an antiseptic cleaner which kills germs and bonds with the skin to continue killing germs even after washing.     Please do not use if you have an allergy to CHG or  antibacterial soaps. If your skin becomes reddened/irritated stop using the CHG.  Do not shave (including legs and underarms) for at least 48 hours prior to first CHG shower. It is OK to shave your face.  Please follow these instructions carefully.     Shower the NIGHT BEFORE SURGERY and the MORNING OF SURGERY with CHG Soap.   If you chose to wash your hair, wash your hair first as usual with your normal shampoo. After you shampoo, rinse your hair and body thoroughly to remove the shampoo.  Then [employer] and genitals (private parts) with your normal soap and rinse thoroughly to remove soap.  After that Use CHG Soap as you would any other liquid soap. You can apply CHG directly to the skin and wash gently with a scrungie or a clean washcloth.   Apply the CHG Soap to your body ONLY FROM THE NECK DOWN.  Do not use on open wounds or open sores. Avoid contact with your eyes, ears, mouth and genitals (private parts). Wash Face and genitals (private parts)  with your normal soap.   Wash thoroughly, paying special attention to the area where your surgery will be performed.  Thoroughly rinse your body with warm water from the neck down.  DO NOT shower/wash with your normal soap after using and rinsing off the CHG Soap.  Pat yourself dry with a CLEAN TOWEL.  Wear CLEAN PAJAMAS to bed the night before surgery  Place CLEAN SHEETS on your bed the night before your surgery  DO NOT SLEEP WITH PETS.   Day of Surgery:  Take a shower with CHG soap. Wear Clean/Comfortable clothing the morning of surgery Do not apply any deodorants/lotions.   Remember to brush your teeth WITH YOUR REGULAR TOOTHPASTE.   Please read over the following fact sheets that you were given.

## 2020-12-29 ENCOUNTER — Encounter (HOSPITAL_COMMUNITY)
Admission: RE | Admit: 2020-12-29 | Discharge: 2020-12-29 | Disposition: A | Discharge status: Home / Self Care | Payer: BC Managed Care – PPO | Source: Ambulatory Visit | Attending: Neurological Surgery | Admitting: Neurological Surgery

## 2020-12-29 ENCOUNTER — Encounter (HOSPITAL_COMMUNITY): Payer: Self-pay

## 2020-12-29 ENCOUNTER — Other Ambulatory Visit: Payer: Self-pay

## 2020-12-29 DIAGNOSIS — Z01818 Encounter for other preprocedural examination: Secondary | ICD-10-CM | POA: Insufficient documentation

## 2020-12-29 LAB — CBC
HCT: 27.2 % — ABNORMAL LOW (ref 39.0–52.0)
Hemoglobin: 10.2 g/dL — ABNORMAL LOW (ref 13.0–17.0)
MCH: 31.3 pg (ref 26.0–34.0)
MCHC: 37.5 g/dL — ABNORMAL HIGH (ref 30.0–36.0)
MCV: 83.4 fL (ref 80.0–100.0)
Platelets: 283 10*3/uL (ref 150–400)
RBC: 3.26 MIL/uL — ABNORMAL LOW (ref 4.22–5.81)
RDW: 14.4 % (ref 11.5–15.5)
WBC: 12.5 10*3/uL — ABNORMAL HIGH (ref 4.0–10.5)
nRBC: 0.4 % — ABNORMAL HIGH (ref 0.0–0.2)

## 2020-12-29 LAB — BASIC METABOLIC PANEL
Anion gap: 8 (ref 5–15)
BUN: 29 mg/dL — ABNORMAL HIGH (ref 6–20)
CO2: 22 mmol/L (ref 22–32)
Calcium: 8.9 mg/dL (ref 8.9–10.3)
Chloride: 110 mmol/L (ref 98–111)
Creatinine, Ser: 2.74 mg/dL — ABNORMAL HIGH (ref 0.61–1.24)
GFR, Estimated: 27 mL/min — ABNORMAL LOW (ref >60)
Glucose, Bld: 117 mg/dL — ABNORMAL HIGH (ref 70–99)
Potassium: 3.7 mmol/L (ref 3.5–5.1)
Sodium: 140 mmol/L (ref 135–145)

## 2020-12-29 LAB — SURGICAL PCR SCREEN
MRSA, PCR: NEGATIVE
Staphylococcus aureus: POSITIVE — AB

## 2020-12-29 NOTE — Progress Notes (Signed)
PCP -  Dr. Quentez Small Cardiologist - Dr. Glenetta Hew- pt has obtained clearance  PPM/ICD - denies   Chest x-ray - 03/27/20 EKG - 05/17/20 Stress Test - 12/05/19 ECHO - 02/05/20 Cardiac Cath - 02/06/20  Sleep Study - 09/19/13 CPAP - yes, pressure setting 9  DM- denies  ASA/Blood Thinner Instructions: Pt instructed to hold Brilinta and ASA starting 10/21   ERAS Protcol - No, NPO   COVID TEST- Pt scheduled for testing on 01/05/21   Anesthesia review: yes, cardiac hx  Patient denies shortness of breath, fever, cough and chest pain at PAT appointment   All instructions explained to the patient, with a verbal understanding of the material. Patient agrees to go over the instructions while at home for a better understanding. Patient also instructed to wear a mask in public after being tested for COVID-19. The opportunity to ask questions was provided.

## 2020-12-30 NOTE — Anesthesia Preprocedure Evaluation (Addendum)
Anesthesia Evaluation  Patient identified by MRN, date of birth, ID band Patient awake    Reviewed: Allergy & Precautions, NPO status , Patient's Chart, lab work & pertinent test results  Airway Mallampati: III  TM Distance: >3 FB Neck ROM: Full    Dental no notable dental hx. (+) Teeth Intact, Dental Advisory Given   Pulmonary sleep apnea and Continuous Positive Airway Pressure Ventilation ,    Pulmonary exam normal breath sounds clear to auscultation       Cardiovascular hypertension, + angina + CAD, + Past MI and + Cardiac Stents  Normal cardiovascular exam Rhythm:Regular Rate:Normal  01/2020 Echo Left ventricle: The left ventricle internal cavity size is normal.  LV systolic function is normal.  No wall motion abnormalities are noted.  The ejection fraction is >55%.  The LV fractional area change is increased, measured from the apical four-chamber view.  Spectral Doppler shows normal pattern of LV diastolic filling. Left atrium: The left atrium was normal. Right atrium: The right atrium is normal in size. Right ventricle: The right ventricular size is normal.  Global RV systolic function is normal. Aortic valve: The aortic valve is composed of 3 leaflets, which appear normally formed and thickened. Mitral valve: The mitral valve is normal. Tricuspid valve: The tricuspid valve is not well seen. Pulmonic valve: The pulmonic valve is not well visualized. Pericardium: There is no pericardial effusion. Aorta: The aorta is normal in size.    Neuro/Psych negative neurological ROS  negative psych ROS   GI/Hepatic negative GI ROS, Neg liver ROS,   Endo/Other    Renal/GU CRFRenal diseaseLab Results      Component                Value               Date                      CREATININE               2.74 (H)            12/29/2020                BUN                      29 (H)              12/29/2020                NA                        140                 12/29/2020                K                        3.7                 12/29/2020                CL                       110                 12/29/2020  CO2                      22                  12/29/2020                 Musculoskeletal  (+) Arthritis ,   Abdominal (+) + obese (BMI 34.03),   Peds  Hematology  (+) anemia , Lab Results      Component                Value               Date                      WBC                      12.5 (H)            12/29/2020                HGB                      10.2 (L)            12/29/2020                HCT                      27.2 (L)            12/29/2020                MCV                      83.4                12/29/2020                PLT                      283                 12/29/2020              Anesthesia Other Findings All: Zocor  Reproductive/Obstetrics                           Anesthesia Physical Anesthesia Plan  ASA: 4  Anesthesia Plan: General   Post-op Pain Management:    Induction: Intravenous  PONV Risk Score and Plan: 3 and Treatment may vary due to age or medical condition and Ondansetron  Airway Management Planned: Oral ETT and Video Laryngoscope Planned  Additional Equipment: None  Intra-op Plan:   Post-operative Plan: Extubation in OR  Informed Consent: I have reviewed the patients History and Physical, chart, labs and discussed the procedure including the risks, benefits and alternatives for the proposed anesthesia with the patient or authorized representative who has indicated his/her understanding and acceptance.     Dental advisory given  Plan Discussed with: CRNA and Anesthesiologist  Anesthesia Plan Comments: (PAT note by Karoline Caldwell, PA-C: [facility] cardiology for history of NSTEMI x2 (age 77-DESPCI D1, November 2021-DES PCI to LCx-OM3),HTN, HLD, OSA on CPAP.  Last seen by Dr. Ellyn Hack 10/20/2020 and discussed  upcoming surgery.  Per note, "Based on the fact that he  is asymptomatic and has had relatively recent revascularization, would not recommend further evaluation as it would not change clinical management.  It would only serve to delay needed surgery.  It would be better to handle ischemic CAD issues postoperatively once he has had his back surgery as opposed to preop. He will need to hold Brilinta/aspirin per recommendation 10 days preop and 5 days postop which does provide increased risk, but at this time he will be almost 11 months post PCI which with new generation stents is less concerning than old generation stents. I am increasing his beta-blocker to help blood pressure and heart rate.  Stable beta-blocker dose along with Imdur both provide cardiovascular protection perioperatively. Recommendation be to proceed to the OR when felt appropriate by the surgeons.  He will hold Brilinta and aspirin according to the recommendations. Continue current dose of carvedilol (started today) along with Imdur and amlodipine perioperatively. Please not hesitate to contact cardiology perioperatively if there are any issues or concerns."  History of CKD 3/4.  Baseline creatinine per review of labs in Epic over the past year appears to be around 2.2.  Preop labs show a mild bump to 2.74, however this is similar to most recent labs done at PCP office 10/26/20 showing creatinine 2.63.  Labs also notable for anemia with hemoglobin 10.2, which also appears to be chronic.  Labs otherwise unremarkable.  EKG 05/17/20: Sinus bradycardia. Rate 57. T wave abnormality, consider inferolateral ischemia.   PCI 02/06/2020: General diagnostic impressions:  Left main: Large-caliber vessel, normal.  LAD has mild irregularities.  D1 has a mid in-stent 70% restenosis.  LCx has mild irregularities.  OM 3 has a proximal 80% stenosis.  RCA has a mid 60 to 65% stenosis.  There is no left main disease.  Interventional summary: Third obtuse  marginal: The initial stenosis was 80%.  Balloon angioplasty was performed using a Emerge balloon 2.0 mm x 12 mm, Synergy XD stent 2.5 mm x 20 mm, and a Quantum apex balloon 2.5 mm x 8 mm.  A drug-eluting stent was deployed using a Synergy XD stent 2.5 mm x 20 mm.  Following intervention there was a 0% residual stenosis.  This was an ACC/AHA non-high/non C lesion for intervention.  There was TIMI flow 3 before the procedure and TIMI flow 3 following the procedure.  Following intervention there was an excellent angiographic appearance and no evidence of thrombus.  TTE 02/05/2020: Left ventricle: The left ventricle internal cavity size is normal.  LV systolic function is normal.  No wall motion abnormalities are noted.  The ejection fraction is >55%.  The LV fractional area change is increased, measured from the apical four-chamber view.  Spectral Doppler shows normal pattern of LV diastolic filling. Left atrium: The left atrium was normal. Right atrium: The right atrium is normal in size. Right ventricle: The right ventricular size is normal.  Global RV systolic function is normal. Aortic valve: The aortic valve is composed of 3 leaflets, which appear normally formed and thickened. Mitral valve: The mitral valve is normal. Tricuspid valve: The tricuspid valve is not well seen. Pulmonic valve: The pulmonic valve is not well visualized. Pericardium: There is no pericardial effusion. Aorta: The aorta is normal in size.  Summary: 1.  LV systolic function is normal. 2.  The ejection fraction is >55%.  )     Anesthesia Quick Evaluation

## 2020-12-30 NOTE — Progress Notes (Signed)
Anesthesia Chart Review:  [facility] Erickson cardiology for history of NSTEMI x2 (age 50-DES PCI D1, November 2021- DES PCI to LCx-OM3), HTN, HLD, OSA on CPAP.  Last seen by Dr. Ellyn Hack 10/20/2020 and discussed upcoming surgery.  Per note, "Based on the fact that he is asymptomatic and has had relatively recent revascularization, would not recommend further evaluation as it would not change clinical management.  It would only serve to delay needed surgery.  It would be better to handle ischemic CAD issues postoperatively once he has had his back surgery as opposed to preop. He will need to hold Brilinta/aspirin per recommendation 10 days preop and 5 days postop which does provide increased risk, but at this time he will be almost 11 months post PCI which with new generation stents is less concerning than old generation stents. I am increasing his beta-blocker to help blood pressure and heart rate.  Stable beta-blocker dose along with Imdur both provide cardiovascular protection perioperatively. Recommendation be to proceed to the OR when felt appropriate by the surgeons.  He will hold Brilinta and aspirin according to the recommendations. Continue current dose of carvedilol (started today) along with Imdur and amlodipine perioperatively. Please not hesitate to contact cardiology perioperatively if there are any issues or concerns."  History of CKD 3/4.  Baseline creatinine per review of labs in Epic over the past year appears to be around 2.2.  Preop labs show a mild bump to 2.74, however this is similar to most recent labs done at PCP office 10/26/20 showing creatinine 2.63.  Labs also notable for anemia with hemoglobin 10.2, which also appears to be chronic.  Labs otherwise unremarkable.  EKG 05/17/20: Sinus bradycardia. Rate 57. T wave abnormality, consider inferolateral ischemia.   PCI 02/06/2020: General diagnostic impressions:  Left main: Large-caliber vessel, normal.  LAD has mild irregularities.  D1 has a mid  in-stent 70% restenosis.  LCx has mild irregularities.  OM 3 has a proximal 80% stenosis.  RCA has a mid 60 to 65% stenosis.  There is no left main disease.  Interventional summary: Third obtuse marginal: The initial stenosis was 80%.  Balloon angioplasty was performed using a Emerge balloon 2.0 mm x 12 mm, Synergy XD stent 2.5 mm x 20 mm, and a Quantum apex balloon 2.5 mm x 8 mm.  A drug-eluting stent was deployed using a Synergy XD stent 2.5 mm x 20 mm.  Following intervention there was a 0% residual stenosis.  This was an ACC/AHA non-high/non C lesion for intervention.  There was TIMI flow 3 before the procedure and TIMI flow 3 following the procedure.  Following intervention there was an excellent angiographic appearance and no evidence of thrombus.  TTE 02/05/2020: Left ventricle: The left ventricle internal cavity size is normal.  LV systolic function is normal.  No wall motion abnormalities are noted.  The ejection fraction is >55%.  The LV fractional area change is increased, measured from the apical four-chamber view.  Spectral Doppler shows normal pattern of LV diastolic filling. Left atrium: The left atrium was normal. Right atrium: The right atrium is normal in size. Right ventricle: The right ventricular size is normal.  Global RV systolic function is normal. Aortic valve: The aortic valve is composed of 3 leaflets, which appear normally formed and thickened. Mitral valve: The mitral valve is normal. Tricuspid valve: The tricuspid valve is not well seen. Pulmonic valve: The pulmonic valve is not well visualized. Pericardium: There is no pericardial effusion. Aorta: The aorta is normal in  size.  Summary: 1.  LV systolic function is normal. 2.  The ejection fraction is >55%.   Wynonia Musty [facility]/Anesthesiology Phone [phone] 12/30/2020 3:28 PM

## 2021-01-05 ENCOUNTER — Other Ambulatory Visit (HOSPITAL_COMMUNITY)
Admission: RE | Admit: 2021-01-05 | Discharge: 2021-01-05 | Disposition: A | Discharge status: Home / Self Care | Payer: BC Managed Care – PPO | Source: Ambulatory Visit | Attending: Neurological Surgery | Admitting: Neurological Surgery

## 2021-01-05 DIAGNOSIS — Z20822 Contact with and (suspected) exposure to covid-19: Secondary | ICD-10-CM | POA: Diagnosis not present

## 2021-01-05 DIAGNOSIS — Z01818 Encounter for other preprocedural examination: Secondary | ICD-10-CM

## 2021-01-05 DIAGNOSIS — Z01812 Encounter for preprocedural laboratory examination: Secondary | ICD-10-CM | POA: Diagnosis not present

## 2021-01-06 LAB — SARS CORONAVIRUS 2 (TAT 6-24 HRS): SARS Coronavirus 2: NEGATIVE

## 2021-01-07 ENCOUNTER — Ambulatory Visit (HOSPITAL_COMMUNITY): Payer: BC Managed Care – PPO | Admitting: Certified Registered"

## 2021-01-07 ENCOUNTER — Encounter (HOSPITAL_COMMUNITY): Payer: Self-pay | Admitting: Neurological Surgery

## 2021-01-07 ENCOUNTER — Ambulatory Visit (HOSPITAL_COMMUNITY)
Admission: RE | Admit: 2021-01-07 | Discharge: 2021-01-07 | Disposition: A | Discharge status: Home / Self Care | Payer: BC Managed Care – PPO | Source: Ambulatory Visit | Attending: Neurological Surgery | Admitting: Neurological Surgery

## 2021-01-07 ENCOUNTER — Ambulatory Visit (HOSPITAL_COMMUNITY): Payer: BC Managed Care – PPO

## 2021-01-07 ENCOUNTER — Ambulatory Visit (HOSPITAL_COMMUNITY): Payer: BC Managed Care – PPO | Admitting: Physician Assistant

## 2021-01-07 ENCOUNTER — Encounter (HOSPITAL_COMMUNITY)
Admission: RE | Disposition: A | Discharge status: Home / Self Care | Payer: Self-pay | Source: Ambulatory Visit | Attending: Neurological Surgery

## 2021-01-07 ENCOUNTER — Other Ambulatory Visit (HOSPITAL_COMMUNITY): Payer: Self-pay | Admitting: Neurological Surgery

## 2021-01-07 ENCOUNTER — Other Ambulatory Visit: Payer: Self-pay

## 2021-01-07 DIAGNOSIS — D72829 Elevated white blood cell count, unspecified: Secondary | ICD-10-CM | POA: Diagnosis not present

## 2021-01-07 DIAGNOSIS — M5116 Intervertebral disc disorders with radiculopathy, lumbar region: Secondary | ICD-10-CM | POA: Insufficient documentation

## 2021-01-07 DIAGNOSIS — Z7982 Long term (current) use of aspirin: Secondary | ICD-10-CM | POA: Diagnosis not present

## 2021-01-07 DIAGNOSIS — Z419 Encounter for procedure for purposes other than remedying health state, unspecified: Secondary | ICD-10-CM

## 2021-01-07 DIAGNOSIS — E876 Hypokalemia: Secondary | ICD-10-CM | POA: Diagnosis not present

## 2021-01-07 DIAGNOSIS — I252 Old myocardial infarction: Secondary | ICD-10-CM | POA: Insufficient documentation

## 2021-01-07 DIAGNOSIS — M5117 Intervertebral disc disorders with radiculopathy, lumbosacral region: Secondary | ICD-10-CM | POA: Diagnosis not present

## 2021-01-07 DIAGNOSIS — Z955 Presence of coronary angioplasty implant and graft: Secondary | ICD-10-CM | POA: Insufficient documentation

## 2021-01-07 DIAGNOSIS — E785 Hyperlipidemia, unspecified: Secondary | ICD-10-CM | POA: Diagnosis not present

## 2021-01-07 DIAGNOSIS — R52 Pain, unspecified: Secondary | ICD-10-CM

## 2021-01-07 DIAGNOSIS — Z79899 Other long term (current) drug therapy: Secondary | ICD-10-CM | POA: Insufficient documentation

## 2021-01-07 DIAGNOSIS — M48061 Spinal stenosis, lumbar region without neurogenic claudication: Secondary | ICD-10-CM | POA: Diagnosis not present

## 2021-01-07 DIAGNOSIS — Z9889 Other specified postprocedural states: Secondary | ICD-10-CM | POA: Diagnosis not present

## 2021-01-07 HISTORY — PX: LUMBAR LAMINECTOMY/ DECOMPRESSION WITH MET-RX: SHX5959

## 2021-01-07 SURGERY — LUMBAR LAMINECTOMY/ DECOMPRESSION WITH MET-RX
Anesthesia: General | Site: Back

## 2021-01-07 MED ORDER — BUPIVACAINE-EPINEPHRINE 0.5% -1:200000 IJ SOLN
INTRAMUSCULAR | Status: AC
  Filled 2021-01-07: qty 1

## 2021-01-07 MED ORDER — BUPIVACAINE-EPINEPHRINE (PF) 0.5% -1:200000 IJ SOLN
INTRAMUSCULAR | Status: DC | PRN
  Administered 2021-01-07: 10 mL

## 2021-01-07 MED ORDER — HYDROMORPHONE HCL 1 MG/ML IJ SOLN
0.2500 mg | INTRAMUSCULAR | Status: DC | PRN
  Administered 2021-01-07 (×2): 0.25 mg via INTRAVENOUS
  Administered 2021-01-07 (×3): 0.5 mg via INTRAVENOUS

## 2021-01-07 MED ORDER — MIDAZOLAM HCL 2 MG/2ML IJ SOLN
INTRAMUSCULAR | Status: AC
  Filled 2021-01-07: qty 2

## 2021-01-07 MED ORDER — OXYCODONE HCL 5 MG/5ML PO SOLN: 5.0000 mg | Freq: Once | ORAL | Status: DC | PRN

## 2021-01-07 MED ORDER — HYDRALAZINE HCL 20 MG/ML IJ SOLN
10.0000 mg | Freq: Once | INTRAMUSCULAR | Status: AC
  Administered 2021-01-07: 10 mg via INTRAVENOUS

## 2021-01-07 MED ORDER — ONDANSETRON HCL 4 MG/2ML IJ SOLN: 4.0000 mg | Freq: Once | INTRAMUSCULAR | Status: DC

## 2021-01-07 MED ORDER — ONDANSETRON HCL 4 MG/2ML IJ SOLN
INTRAMUSCULAR | Status: AC
  Filled 2021-01-07: qty 2

## 2021-01-07 MED ORDER — HYDROMORPHONE HCL 1 MG/ML IJ SOLN
INTRAMUSCULAR | Status: AC
  Filled 2021-01-07: qty 1

## 2021-01-07 MED ORDER — FENTANYL CITRATE (PF) 100 MCG/2ML IJ SOLN
INTRAMUSCULAR | Status: DC | PRN
  Administered 2021-01-07: 100 ug via INTRAVENOUS

## 2021-01-07 MED ORDER — CEFAZOLIN SODIUM-DEXTROSE 2-4 GM/100ML-% IV SOLN
2.0000 g | INTRAVENOUS | Status: AC
  Administered 2021-01-07: 2 g via INTRAVENOUS
  Filled 2021-01-07: qty 100

## 2021-01-07 MED ORDER — CHLORHEXIDINE GLUCONATE 0.12 % MT SOLN
15.0000 mL | Freq: Once | OROMUCOSAL | Status: AC
  Administered 2021-01-07: 15 mL via OROMUCOSAL
  Filled 2021-01-07: qty 15

## 2021-01-07 MED ORDER — EPHEDRINE SULFATE-NACL 50-0.9 MG/10ML-% IV SOSY
PREFILLED_SYRINGE | INTRAVENOUS | Status: DC | PRN
  Administered 2021-01-07: 5 mg via INTRAVENOUS

## 2021-01-07 MED ORDER — DEXAMETHASONE SODIUM PHOSPHATE 10 MG/ML IJ SOLN
INTRAMUSCULAR | Status: DC | PRN
  Administered 2021-01-07: 10 mg via INTRAVENOUS

## 2021-01-07 MED ORDER — FENTANYL CITRATE (PF) 250 MCG/5ML IJ SOLN
INTRAMUSCULAR | Status: AC
  Filled 2021-01-07: qty 5

## 2021-01-07 MED ORDER — LIDOCAINE 2% (20 MG/ML) 5 ML SYRINGE
INTRAMUSCULAR | Status: AC
  Filled 2021-01-07: qty 5

## 2021-01-07 MED ORDER — LIDOCAINE 2% (20 MG/ML) 5 ML SYRINGE
INTRAMUSCULAR | Status: DC | PRN
  Administered 2021-01-07: 100 mg via INTRAVENOUS

## 2021-01-07 MED ORDER — ONDANSETRON HCL 4 MG/2ML IJ SOLN
4.0000 mg | Freq: Once | INTRAMUSCULAR | Status: AC | PRN
  Administered 2021-01-07: 4 mg via INTRAVENOUS

## 2021-01-07 MED ORDER — LACTATED RINGERS IV SOLN: INTRAVENOUS | Status: DC

## 2021-01-07 MED ORDER — HYDRALAZINE HCL 20 MG/ML IJ SOLN
INTRAMUSCULAR | Status: AC
  Filled 2021-01-07: qty 1

## 2021-01-07 MED ORDER — HEMOSTATIC AGENTS (NO CHARGE) OPTIME
TOPICAL | Status: DC | PRN
  Administered 2021-01-07: 1 via TOPICAL

## 2021-01-07 MED ORDER — ROCURONIUM BROMIDE 10 MG/ML (PF) SYRINGE
PREFILLED_SYRINGE | INTRAVENOUS | Status: DC | PRN
  Administered 2021-01-07: 10 mg via INTRAVENOUS
  Administered 2021-01-07: 90 mg via INTRAVENOUS
  Administered 2021-01-07: 20 mg via INTRAVENOUS

## 2021-01-07 MED ORDER — MIDAZOLAM HCL 5 MG/5ML IJ SOLN
INTRAMUSCULAR | Status: DC | PRN
  Administered 2021-01-07: 2 mg via INTRAVENOUS

## 2021-01-07 MED ORDER — THROMBIN 5000 UNITS EX SOLR
CUTANEOUS | Status: AC
  Filled 2021-01-07: qty 5000

## 2021-01-07 MED ORDER — AMISULPRIDE (ANTIEMETIC) 5 MG/2ML IV SOLN
INTRAVENOUS | Status: AC
  Filled 2021-01-07: qty 4

## 2021-01-07 MED ORDER — LIDOCAINE-EPINEPHRINE 1 %-1:100000 IJ SOLN
INTRAMUSCULAR | Status: AC
  Filled 2021-01-07: qty 1

## 2021-01-07 MED ORDER — OXYCODONE HCL 5 MG PO TABS: 5.0000 mg | ORAL_TABLET | Freq: Once | ORAL | Status: DC | PRN

## 2021-01-07 MED ORDER — LACTATED RINGERS IV SOLN: INTRAVENOUS | Status: DC | PRN

## 2021-01-07 MED ORDER — ACETAMINOPHEN 10 MG/ML IV SOLN
1000.0000 mg | Freq: Once | INTRAVENOUS | Status: DC | PRN
  Administered 2021-01-07: 1000 mg via INTRAVENOUS

## 2021-01-07 MED ORDER — FENTANYL CITRATE (PF) 100 MCG/2ML IJ SOLN
INTRAMUSCULAR | Status: AC
  Filled 2021-01-07: qty 2

## 2021-01-07 MED ORDER — EPHEDRINE 5 MG/ML INJ
INTRAVENOUS | Status: AC
  Filled 2021-01-07: qty 5

## 2021-01-07 MED ORDER — ONDANSETRON HCL 4 MG/2ML IJ SOLN
INTRAMUSCULAR | Status: DC | PRN
  Administered 2021-01-07: 4 mg via INTRAVENOUS

## 2021-01-07 MED ORDER — THROMBIN 5000 UNITS EX SOLR: OROMUCOSAL | Status: DC | PRN

## 2021-01-07 MED ORDER — PHENYLEPHRINE 40 MCG/ML (10ML) SYRINGE FOR IV PUSH (FOR BLOOD PRESSURE SUPPORT)
PREFILLED_SYRINGE | INTRAVENOUS | Status: DC | PRN
  Administered 2021-01-07 (×2): 80 ug via INTRAVENOUS

## 2021-01-07 MED ORDER — PROPOFOL 10 MG/ML IV BOLUS
INTRAVENOUS | Status: AC
  Filled 2021-01-07: qty 20

## 2021-01-07 MED ORDER — DEXAMETHASONE SODIUM PHOSPHATE 10 MG/ML IJ SOLN
INTRAMUSCULAR | Status: AC
  Filled 2021-01-07: qty 1

## 2021-01-07 MED ORDER — METHYLPREDNISOLONE ACETATE 80 MG/ML IJ SUSP
INTRAMUSCULAR | Status: AC
  Filled 2021-01-07: qty 1

## 2021-01-07 MED ORDER — 0.9 % SODIUM CHLORIDE (POUR BTL) OPTIME
TOPICAL | Status: DC | PRN
  Administered 2021-01-07: 1000 mL

## 2021-01-07 MED ORDER — HYDROCODONE-ACETAMINOPHEN 5-325 MG PO TABS: 1.0000 | ORAL_TABLET | Freq: Four times a day (QID) | ORAL | 0 refills | Status: AC | PRN

## 2021-01-07 MED ORDER — CHLORHEXIDINE GLUCONATE CLOTH 2 % EX PADS: 6.0000 | MEDICATED_PAD | Freq: Once | CUTANEOUS | Status: DC

## 2021-01-07 MED ORDER — METHYLPREDNISOLONE ACETATE 80 MG/ML IJ SUSP
INTRAMUSCULAR | Status: DC | PRN
  Administered 2021-01-07: 80 mg

## 2021-01-07 MED ORDER — LIDOCAINE-EPINEPHRINE 1 %-1:100000 IJ SOLN
INTRAMUSCULAR | Status: DC | PRN
  Administered 2021-01-07: 10 mL

## 2021-01-07 MED ORDER — FENTANYL CITRATE (PF) 250 MCG/5ML IJ SOLN
INTRAMUSCULAR | Status: DC | PRN
  Administered 2021-01-07: 100 ug via INTRAVENOUS
  Administered 2021-01-07 (×2): 50 ug via INTRAVENOUS

## 2021-01-07 MED ORDER — ORAL CARE MOUTH RINSE: 15.0000 mL | Freq: Once | OROMUCOSAL | Status: AC

## 2021-01-07 MED ORDER — ROCURONIUM BROMIDE 10 MG/ML (PF) SYRINGE
PREFILLED_SYRINGE | INTRAVENOUS | Status: AC
  Filled 2021-01-07: qty 10

## 2021-01-07 MED ORDER — ACETAMINOPHEN 10 MG/ML IV SOLN
INTRAVENOUS | Status: AC
  Filled 2021-01-07: qty 100

## 2021-01-07 MED ORDER — AMISULPRIDE (ANTIEMETIC) 5 MG/2ML IV SOLN
10.0000 mg | Freq: Once | INTRAVENOUS | Status: AC | PRN
  Administered 2021-01-07: 10 mg via INTRAVENOUS

## 2021-01-07 MED ORDER — PROPOFOL 10 MG/ML IV BOLUS
INTRAVENOUS | Status: DC | PRN
  Administered 2021-01-07: 160 mg via INTRAVENOUS

## 2021-01-07 MED ORDER — SUGAMMADEX SODIUM 200 MG/2ML IV SOLN
INTRAVENOUS | Status: DC | PRN
  Administered 2021-01-07: 400 mg via INTRAVENOUS

## 2021-01-07 MED ORDER — PHENYLEPHRINE HCL-NACL 20-0.9 MG/250ML-% IV SOLN
INTRAVENOUS | Status: DC | PRN
  Administered 2021-01-07: 40 ug/min via INTRAVENOUS

## 2021-01-07 SURGICAL SUPPLY — 72 items
ADH SKN CLS APL DERMABOND .7 (GAUZE/BANDAGES/DRESSINGS) ×1
BAG COUNTER SPONGE SURGICOUNT (BAG) ×2 IMPLANT
BAG SPNG CNTER NS LX DISP (BAG) ×1
BAND INSRT 18 STRL LF DISP RB (MISCELLANEOUS) ×2
BAND RUBBER #18 3X1/16 STRL (MISCELLANEOUS) ×4 IMPLANT
BLADE SURG 11 STRL SS (BLADE) ×2 IMPLANT
BUR MATCHSTICK NEURO 3.0 LAGG (BURR) ×2 IMPLANT
BUR RND OSTEON ELITE 6.0 (BURR) ×2 IMPLANT
CARTRIDGE OIL MAESTRO DRILL (MISCELLANEOUS) ×1 IMPLANT
CNTNR URN SCR LID CUP LEK RST (MISCELLANEOUS) IMPLANT
CONT SPEC 4OZ STRL OR WHT (MISCELLANEOUS)
COVER BACK TABLE 60X90IN (DRAPES) ×2 IMPLANT
COVER MAYO STAND STRL (DRAPES) ×2 IMPLANT
DECANTER SPIKE VIAL GLASS SM (MISCELLANEOUS) ×1 IMPLANT
DERMABOND ADVANCED (GAUZE/BANDAGES/DRESSINGS) ×1
DERMABOND ADVANCED .7 DNX12 (GAUZE/BANDAGES/DRESSINGS) ×1 IMPLANT
DIFFUSER DRILL AIR PNEUMATIC (MISCELLANEOUS) ×1 IMPLANT
DRAIN JACKSON RD 7FR 3/32 (WOUND CARE) IMPLANT
DRAPE C-ARM 42X72 X-RAY (DRAPES) ×2 IMPLANT
DRAPE LAPAROTOMY 100X72X124 (DRAPES) ×2 IMPLANT
DRAPE MICROSCOPE LEICA (MISCELLANEOUS) ×2 IMPLANT
DRAPE SURG 17X23 STRL (DRAPES) ×1 IMPLANT
DRSG OPSITE POSTOP 3X4 (GAUZE/BANDAGES/DRESSINGS) ×2 IMPLANT
DURAPREP 26ML APPLICATOR (WOUND CARE) ×2 IMPLANT
ELECT BLADE 6.5 EXT (BLADE) ×1 IMPLANT
ELECT BLADE INSULATED 6.5IN (ELECTROSURGICAL) ×4
ELECT REM PT RETURN 9FT ADLT (ELECTROSURGICAL) ×2
ELECTRODE BLDE INSULATED 6.5IN (ELECTROSURGICAL) ×1 IMPLANT
ELECTRODE REM PT RTRN 9FT ADLT (ELECTROSURGICAL) ×1 IMPLANT
GAUZE 4X4 16PLY ~~LOC~~+RFID DBL (SPONGE) ×1 IMPLANT
GLOVE SRG 8 PF TXTR STRL LF DI (GLOVE) ×2 IMPLANT
GLOVE SURG ENC MOIS LTX SZ8 (GLOVE) ×1 IMPLANT
GLOVE SURG LTX SZ8 (GLOVE) ×5 IMPLANT
GLOVE SURG UNDER POLY LF SZ8 (GLOVE) ×12
GLOVE SURG UNDER POLY LF SZ8.5 (GLOVE) ×1 IMPLANT
GOWN STRL REUS W/ TWL LRG LVL3 (GOWN DISPOSABLE) IMPLANT
GOWN STRL REUS W/ TWL XL LVL3 (GOWN DISPOSABLE) ×1 IMPLANT
GOWN STRL REUS W/TWL 2XL LVL3 (GOWN DISPOSABLE) ×3 IMPLANT
GOWN STRL REUS W/TWL LRG LVL3 (GOWN DISPOSABLE)
GOWN STRL REUS W/TWL XL LVL3 (GOWN DISPOSABLE) ×4
HEMOSTAT POWDER KIT SURGIFOAM (HEMOSTASIS) ×2 IMPLANT
KIT BASIN OR (CUSTOM PROCEDURE TRAY) ×2 IMPLANT
KIT POSITION SURG JACKSON T1 (MISCELLANEOUS) ×2 IMPLANT
KIT TURNOVER KIT B (KITS) ×2 IMPLANT
MARKER SKIN DUAL TIP RULER LAB (MISCELLANEOUS) ×2 IMPLANT
NDL HYPO 18GX1.5 BLUNT FILL (NEEDLE) IMPLANT
NDL HYPO 25X1 1.5 SAFETY (NEEDLE) ×1 IMPLANT
NDL SPNL 18GX3.5 QUINCKE PK (NEEDLE) ×1 IMPLANT
NEEDLE HYPO 18GX1.5 BLUNT FILL (NEEDLE) ×2 IMPLANT
NEEDLE HYPO 25X1 1.5 SAFETY (NEEDLE) ×2 IMPLANT
NEEDLE SPNL 18GX3.5 QUINCKE PK (NEEDLE) ×2 IMPLANT
NS IRRIG 1000ML POUR BTL (IV SOLUTION) ×2 IMPLANT
OIL CARTRIDGE MAESTRO DRILL (MISCELLANEOUS) ×2
PACK LAMINECTOMY NEURO (CUSTOM PROCEDURE TRAY) ×2 IMPLANT
PAD ARMBOARD 7.5X6 YLW CONV (MISCELLANEOUS) ×5 IMPLANT
PATTIES SURGICAL .5 X.5 (GAUZE/BANDAGES/DRESSINGS) IMPLANT
PATTIES SURGICAL .5 X1 (DISPOSABLE) IMPLANT
PATTIES SURGICAL 1X1 (DISPOSABLE) IMPLANT
PENCIL BUTTON HOLSTER BLD 10FT (ELECTRODE) ×1 IMPLANT
SPONGE SURGIFOAM ABS GEL 12-7 (HEMOSTASIS) ×1 IMPLANT
SPONGE SURGIFOAM ABS GEL SZ50 (HEMOSTASIS) ×1 IMPLANT
SPONGE T-LAP 4X18 ~~LOC~~+RFID (SPONGE) ×1 IMPLANT
STAPLER VISISTAT 35W (STAPLE) ×1 IMPLANT
SUT VIC AB 0 CT1 27 (SUTURE)
SUT VIC AB 0 CT1 27XBRD ANBCTR (SUTURE) IMPLANT
SUT VIC AB 2-0 CP2 18 (SUTURE) ×2 IMPLANT
SUT VIC AB 3-0 SH 8-18 (SUTURE) ×2 IMPLANT
SYR 5ML LL (SYRINGE) ×1 IMPLANT
TOWEL GREEN STERILE (TOWEL DISPOSABLE) ×1 IMPLANT
TOWEL GREEN STERILE FF (TOWEL DISPOSABLE) ×1 IMPLANT
TRAY FOLEY MTR SLVR 16FR STAT (SET/KITS/TRAYS/PACK) IMPLANT
WATER STERILE IRR 1000ML POUR (IV SOLUTION) ×2 IMPLANT

## 2021-01-07 NOTE — Op Note (Signed)
Date of service: 01/07/2021  PREOP DIAGNOSIS: Lumbar disc herniation, left L5-S1; right L4-5 with cephalad migration; left S1 radiculopathy and right L4-5 radiculopathy  POSTOP DIAGNOSIS: Same  PROCEDURE: 1.  Right L4-5 minimally invasive laminectomy, partial facetectomy and microdiscectomy for decompression of nerve root L4, L5 2.  Left L5-S1 minimally invasive laminectomy, partial facetectomy and microdiscectomy for decompression of nerve root left S1 2. Use of operating microscope 3. Use of intraoperative fluoroscopy  SURGEON: Dr. Pieter Partridge C. Abree Romick, DO  ASSISTANT: Weston Brass, NP; Verdis Prime, RN  ANESTHESIA: General Endotracheal  EBL: 50 cc  SPECIMENS: None  DRAINS: None  COMPLICATIONS: None  CONDITION: Hemodynamically stable  HISTORY: George Erickson is a 50 y.o. male that presented with right knee extension, quad weakness and right lower extremity numbness and tingling that was quite severe and progressive.  He had undergone conservative measures which did not help him significantly including steroids and pain control.  He also had left lower extremity pain that radiated down into the lateral aspect of his foot down the posterior aspect of his leg.  MRI of the lumbar spine revealed a left L5-S1 broad-based disc bulge with compression of the S1 nerve root along the lateral recess.  At L4-5 which showed a lateral recess large disc herniation with cephalad migration and compression of L5 nerve root and L4 nerve root.  We discussed conservative measures to continuation but however given his weakness and intractable pain, I recommended surgical intervention.  We discussed all risk benefits and expected outcomes and alternatives, he agreed with surgical intervention.  Informed consent was obtained.  PROCEDURE IN DETAIL: After informed consent was obtained and witnessed, the patient was brought to the operating room. After induction of general anesthesia, the patient was positioned on  the operative table in the prone position with all pressure points meticulously padded. The skin of the low back was then prepped and draped in the usual sterile fashion.  Physician driven timeout was performed.  Under fluoroscopy, the right L 4-5 level was identified and marked out on the skin, and after timeout was conducted. Skin incision was then made sharply with a 10 blade and Bovie electrocautery was used to dissect the subcutaneous tissue until the lumbodorsal fascia was identified. The fascia was then incised using Bovie electrocautery and the lamina at the right sided L4 levels was identified and dissection was carried out in the subperiosteal plane using the Metrx dilators.  An appropriate sized Metrx tube was placed, and intraoperative x-ray was taken to confirm appropriate placement.  The microscope was sterilely draped and brought into the field and used for the remainder of the case.  Using Bovie electrocautery, soft tissue was cleared from the lamina and medial facet.  Using a high-speed drill, laminotomy was completed with a partial medial facetectomy. The ligamentum flavum was then identified and removed and the lateral edge of the thecal sac was identified. This was then traced down to identify the traversing nerve root. Dissection was then carried out superior and lateral to the nerve root to identify the disc herniation. The posterior annulus was then coagulated with bipolar cautery and incised with an 11 blade and using a combination of dissectors, curettes, and rongeurs, the herniated disc fragment was removed. The decompression of the nerve root was confirmed using a dissector.  There were multiple small fragments that had cephalad migration that were removed.  The thecal sac was gently taken out of retraction and returned to its original position no longer tented superiorly.  It was pulsatile and relaxed.  Hemostasis was then secured using a combination of morcellized Gelfoam and  thrombin and bipolar electrocautery. The wound is irrigated with copious amounts of antibiotic saline irrigation.  The traversing and exiting nerve roots were felt with a Murphy ball probe and noted to be decompressed.  The wound was noted to be excellently hemostatic.  The nerve root was then covered with a long-acting steroid solution and fentanyl.  The Metrx tube was then removed, hemostasis was achieved with bipolar cautery.  Attention was then turned to the left L5-S1 microdiscectomy. Under fluoroscopy, the left L5-S1 was identified and marked out on the skin, and after timeout was conducted. Skin incision was then made sharply with a 10 blade and Bovie electrocautery was used to dissect the subcutaneous tissue until the lumbodorsal fascia was identified. The fascia was then incised using Bovie electrocautery and the lamina at the left L5 levels was identified and dissection was carried out in the subperiosteal plane using the Metrx dilators.  An appropriate sized Metrx tube was placed, and intraoperative x-ray was taken to confirm appropriate placement.  The microscope was brought back into the field.  Using Bovie electrocautery, soft tissue was cleared from the lamina and medial facet.  Using a high-speed drill, laminotomy was completed with a partial medial facetectomy. The ligamentum flavum was then identified and removed and the lateral edge of the thecal sac was identified. This was then traced down to identify the traversing nerve root. Dissection was then carried out superior and lateral to the nerve root to identify the disc herniation. The posterior annulus was then coagulated with bipolar cautery and incised with an 11 blade and using a combination of dissectors, curettes, and rongeurs, the herniated disc fragment was removed. The decompression of the nerve root was confirmed using a dissector.  There were multiple small fragments that were removed that were irritating the S1 nerve root.  The  thecal sac was gently taken out of retraction and returned to its original position no longer tented superiorly.  It was pulsatile and relaxed.  Hemostasis was then secured using a combination of morcellized Gelfoam and thrombin and bipolar electrocautery. The wound is irrigated with copious amounts of antibiotic saline irrigation.  The traversing and exiting nerve roots were felt with a Murphy ball probe and noted to be decompressed.  The wound was noted to be excellently hemostatic.  The nerve root was then covered with a long-acting steroid solution and fentanyl.  The Metrx tube was then removed, hemostasis was achieved with bipolar cautery.  The wound is closed in layers using 2-0 Vicryl stitches. The skin was closed using standard skin glue.  Sterile dressing was applied.  Drapes were taken down.  At the end of the case all sponge, needle, and instrument counts were correct. The patient was then fully transferred to the stretcher, extubated and taken to the postanesthesia care unit in stable hemodynamic condition.

## 2021-01-07 NOTE — Transfer of Care (Signed)
Immediate Anesthesia Transfer of Care Note  Patient: George Erickson  Procedure(s) Performed: Minimally Invasive Microdiscectomy right Lumbar four-Five, Left Lumbar five-Sacral one (Back)  Patient Location: PACU  Anesthesia Type:General  Level of Consciousness: awake, alert  and oriented  Airway & Oxygen Therapy: Patient Spontanous Breathing and Patient connected to face mask oxygen  Post-op Assessment: Report given to RN, Post -op Vital signs reviewed and stable and Patient moving all extremities X 4  Post vital signs: Reviewed and stable  Last Vitals:  Vitals Value Taken Time  BP 129/114 01/07/21 1037  Temp    Pulse 71 01/07/21 1038  Resp 19 01/07/21 1038  SpO2 98 % 01/07/21 1038  Vitals shown include unvalidated device data.  Last Pain:  Vitals:   01/07/21 0615  TempSrc:   PainSc: 0-No pain      Patients Stated Pain Goal: 2 (33/43/56 8616)  Complications: No notable events documented.

## 2021-01-07 NOTE — Discharge Instructions (Addendum)
Okay to restart aspirin and Brilinta 7 days after surgery on 01/14/2021

## 2021-01-07 NOTE — Anesthesia Postprocedure Evaluation (Signed)
Anesthesia Post Note  Patient: George Erickson  Procedure(s) Performed: Minimally Invasive Microdiscectomy right Lumbar four-Five, Left Lumbar five-Sacral one (Back)     Patient location during evaluation: PACU Anesthesia Type: General Level of consciousness: awake and alert Pain management: pain level controlled Vital Signs Assessment: post-procedure vital signs reviewed and stable Respiratory status: spontaneous breathing, nonlabored ventilation, respiratory function stable and patient connected to nasal cannula oxygen Cardiovascular status: blood pressure returned to baseline and stable Postop Assessment: no apparent nausea or vomiting Anesthetic complications: no   No notable events documented.  Last Vitals:  Vitals:   01/07/21 1152 01/07/21 1201  BP: (!) 173/98 (!) 162/90  Pulse: 70 77  Resp: 19 20  Temp:  36.7 C  SpO2: 93% 93%    Last Pain:  Vitals:   01/07/21 1201  TempSrc:   PainSc: 3                  [name]

## 2021-01-07 NOTE — H&P (Signed)
Providing Compassionate, Quality Care - Together  NEUROSURGERY HISTORY & PHYSICAL   George Erickson is an 50 y.o. male.   Chief Complaint: Right, left lower extremity radiculopathy HPI: This is a 50 year old male with a history of left and right lower extremity radiculopathy its been ongoing for many months.  He has undergone conservative treatment without any significant improvement.  He has some weakness in his right lower extremity mainly in his knee due to his right lower extremity radiculopathy with numbness in his anterior lateral shin.  He also has pain radiating down his left lower extremity down to his foot in the S1 dermatome.  Imaging revealed an L4-5 needed nucleus pulposus on the right with severe stenosis and a left L5-S1 herniated disc with S1 nerve root compression.  He presents today for surgical intervention, has received cardiac clearance and has been off his aspirin and Brilinta for 8 days.  Past Medical History:  Diagnosis Date   Allergic rhinitis due to pollen    Arthritis    lower back   CAD S/P DES PCI D1 & OM3 11/15/2009   a) NSTEMI - 100% D1 (DES PCI - Promus DES 2.25 x 23 -- 2.3 mm); otherwise minimal CAD in a co-dominant system;; b) 02/06/20: OM3 80% (DES PCI), D1 70% ISR, mRCA 60-65%.    Chronic left shoulder pain    Impingement syndrome   Erectile dysfunction    Gout    Hyperlipidemia with target LDL less than 70    Associated CAD   Hypertension    Hypertensive kidney disease with CKD stage IV (HCC)    Cr as of August 2021 1.92   Non-ST elevation (NSTEMI) myocardial infarction ([place]) 11/2009; 01/2020   a) 9/'11: Cath - 100% D1 --> DES PCI Promus 2.25 x 23 (2.3 mm).  EF 55% with anterolateral HK. -> patent stent in Sept 2012 & 2015; b) 02/05/2020: NSTEMI - Echo EF >55% no RWMA - 80% OM3 (DES PCI), 70% D1 ISR, 60-65% mRCA.   Obesity (BMI 30.0-34.9)    OSA on CPAP    uses cpap   Vertigo     Past Surgical History:  Procedure Laterality Date   CORONARY  STENT INTERVENTION  02/06/2020   [facility] [place], [place] -> Dr. Gretta Cool): NSTEMI: OM3 80% -> DES PCI: Synergy DES 2.5 x 20 - > 2.6 mm   CORONARY STENT PLACEMENT  11/15/2009   (Dr. Burt Knack) -- DES PCI of 100% D1 - Promus DES 2.25 x 23 --> 2.3 mm   HERNIA REPAIR     hernia repair at birth   LEFT HEART CATH AND CORONARY ANGIOGRAPHY  11/15/2009   (Dr. Burt Knack): NSTEMI -- LM-< LAD & LCx. ~mLAD ~30-40% just past D1 (D1 - 100% - CULPRIT - PCI), LCx normal - 2 OM & LPL. Dom RCA with Large PDA (small PL), ~30% mRCA.  EF ~55% with Ant-Lat HK    LEFT HEART CATH AND CORONARY ANGIOGRAPHY  11/22/2010   (Dr. Wynonia Lawman): Patent Diag 1 stent with minimal CAD elsewhere.  EF ~55-60% with Anterolateral HK   LEFT HEART CATH AND CORONARY ANGIOGRAPHY  12/31/2013   [facility] - for Abnormal Myoview) --> Non-obstructive CAD with widely patent D1 stent.  LVEDP ~16 mmHg   LEFT HEART CATH AND CORONARY ANGIOGRAPHY  02/06/2020   [facility] [place], [place] -> Dr. Elsie Amis): NSTEMI: D1 70% mid stent ISR; OM3 80% (-> DES PCI), mRCA 60-65%.     NASAL  SINUS SURGERY  2010   For sleep apnea   NM MYOVIEW LTD  12/05/2019   EF> 65%.  Fixed anterior perfusion defect with normal motion suggesting artifact.  Read as LOW RISK -> but NOT NORMAL   TM MYOVIEW  12/2013   (FALSE POSITIVE) + TM - lateral ST depressions (also noted with recovery - near syncope) -> Reversible Anterior Wall Perfusion defect - CRO Ischemia; Partialy reversible Inferior defect - ~ diaphragmatic attenuation. EF ~48%. --> Referred for CATH --> FALSE POSTIIVE   TRANSTHORACIC ECHOCARDIOGRAM  07/08/2014   EF 60%. No RWMA. Mild LA dilation. Normal walves.   TRANSTHORACIC ECHOCARDIOGRAM  02/05/2020   [facility] [place], [place] -> Dr. Elsie Amis): NSTEMI: EF> 55%. No R WMA. Normal valves.    Family History  Problem Relation Age of Onset   Hypertension Mother    Coronary artery disease Mother 50       s/p CABG    Diabetes Mellitus II Mother    Hyperlipidemia Mother    Hypertension Father    Heart attack Father 59       Had several after   Diabetes Mellitus II Father    Heart failure Father    CVA Father 14   Hyperlipidemia Father    CVA Sister    Hypothyroidism Sister    Hypertension Sister    Healthy Brother    Healthy Brother    Hypertension Sister    Heart attack Sister    Social History:  reports that he has never smoked. He has never used smokeless tobacco. He reports current alcohol use of about 1.0 standard drink per week. He reports that he does not use drugs.  Allergies:  Allergies  Allergen Reactions   Niacin And Related Itching   Zocor [Simvastatin-High Dose]     Muscle aches    Medications Prior to Admission  Medication Sig Dispense Refill   amLODipine (NORVASC) 10 MG tablet Take 10 mg by mouth daily.     aspirin EC 81 MG tablet Take 81 mg by mouth daily. Swallow whole.     carvedilol (COREG) 6.25 MG tablet Take 1 tablet (6.25 mg total) by mouth 2 (two) times daily. 180 tablet 3   Cholecalciferol (VITAMIN D) 50 MCG (2000 UT) CAPS Take 2,000 Units by mouth daily.     cyclobenzaprine (FLEXERIL) 10 MG tablet Take 10 mg by mouth 3 (three) times daily as needed for muscle spasms.     gabapentin (NEURONTIN) 100 MG capsule Take 100 mg by mouth 2 (two) times daily.     hydrALAZINE (APRESOLINE) 100 MG tablet Take 1 tablet (100 mg total) by mouth 2 (two) times daily. May take an additional tablet if  Systolic blood pressure is greater than 150. (Patient taking differently: Take 100 mg by mouth 3 (three) times daily. May take an additional tablet if  Systolic blood pressure is greater than 150.) 270 tablet 3   isosorbide mononitrate (IMDUR) 30 MG 24 hr tablet Take 1 tablet by mouth once daily 90 tablet 2   montelukast (SINGULAIR) 10 MG tablet Take 10 mg by mouth daily.     nitroGLYCERIN (NITROSTAT) 0.4 MG SL tablet Place 1 tablet (0.4 mg total) under the tongue every 5 (five) minutes as  needed for chest pain. 25 tablet 6   pravastatin (PRAVACHOL) 40 MG tablet TAKE 1 TABLET BY MOUTH ONCE DAILY IN THE EVENING 30 tablet 11   sennosides-docusate sodium (SENOKOT-S) 8.6-50 MG tablet Take 1 tablet by mouth  daily as needed for constipation.     ticagrelor (BRILINTA) 90 MG TABS tablet Take 1 tablet (90 mg total) by mouth 2 (two) times daily. 180 tablet 3   vitamin C (ASCORBIC ACID) 500 MG tablet Take 500 mg by mouth daily.     famotidine (PEPCID) 20 MG tablet Take 20 mg by mouth at bedtime as needed for indigestion or heartburn.     PROAIR RESPICLICK 347 (90 Base) MCG/ACT AEPB Inhale 2 puffs into the lungs every 4 (four) hours as needed (SOB/Wheezing).      Results for orders placed or performed during the hospital encounter of 01/05/21 (from the past 48 hour(s))  SARS CORONAVIRUS 2 (TAT 6-24 HRS) Nasopharyngeal Nasopharyngeal Swab     Status: None   Collection Time: 01/05/21  1:41 PM   Specimen: Nasopharyngeal Swab  Result Value Ref Range   SARS Coronavirus 2 NEGATIVE NEGATIVE    Comment: (NOTE) SARS-CoV-2 target nucleic acids are NOT DETECTED.  The SARS-CoV-2 RNA is generally detectable in upper and lower respiratory specimens during the acute phase of infection. Negative results do not preclude SARS-CoV-2 infection, do not rule out co-infections with other pathogens, and should not be used as the sole basis for treatment or other patient management decisions. Negative results must be combined with clinical observations, patient history, and epidemiological information. The expected result is Negative.  Fact Sheet for Patients: SugarRoll.be  Fact Sheet for Healthcare Providers: https://www.woods-mathews.com/  This test is not yet approved or cleared by the [place] FDA and  has been authorized for detection and/or diagnosis of SARS-CoV-2 by FDA under an Emergency Use Authorization (EUA). This EUA will remain  in effect  (meaning this test can be used) for the duration of the COVID-19 declaration under Se ction 564(b)(1) of the Act, 21 U.S.C. section 360bbb-3(b)(1), unless the authorization is terminated or revoked sooner.  Performed at [facility], [place] [address], [place],  [address]    No results found.  ROS All positives and negatives are listed in HPI above  Blood pressure (!) 163/81, pulse 64, temperature 98.3 F (36.8 C), temperature source Oral, resp. rate 18, height 5\' 11"  (1.803 m), weight 110.7 kg, SpO2 96 %. Physical Exam  ANO x3 PERRLA EOMI Cranial nerves II through XII intact Bilateral upper extremity 5/5 Right lower extremity full strength except for knee extensor and dorsi flexion 4/5 Left lower extremity full strength except for plantar flexion 4+/5 Decree sensation right lower extremity L4 dermatome, left lower extremity S1 dermatome  Assessment/Plan 50 year old male with  Right L4-5 herniated nucleus pulposus with radiculopathy and weakness Left L5-S1 herniated nucleus pulposus with S1 radiculopathy  -OR today for right L4-5 microdiscectomy, left L5-S1 microdiscectomy.  He received medical clearance and cardiac clearance, has been off his anticoagulation.  All risks, benefits and expected outcomes and alternatives were discussed and agreed upon.  He agrees to proceed with surgical intervention.   Thank you for allowing me to participate in this patient's care.  Please do not hesitate to call with questions or concerns.   Elwin Sleight, [facility] Cell: [phone]

## 2021-01-07 NOTE — Anesthesia Procedure Notes (Signed)
Procedure Name: Intubation Date/Time: 01/07/2021 7:43 AM Performed by: Gaylene Brooks, CRNA Pre-anesthesia Checklist: Patient identified, Emergency Drugs available, Suction available and Patient being monitored Patient Re-evaluated:Patient Re-evaluated prior to induction Oxygen Delivery Method: Circle System Utilized Preoxygenation: Pre-oxygenation with 100% oxygen Induction Type: IV induction Ventilation: Mask ventilation without difficulty and Oral airway inserted - appropriate to patient size Laryngoscope Size: Glidescope and 4 Grade View: Grade I Tube type: Oral Tube size: 7.5 mm Number of attempts: 1 Airway Equipment and Method: Stylet, Oral airway and Video-laryngoscopy Placement Confirmation: ETT inserted through vocal cords under direct vision, positive ETCO2 and breath sounds checked- equal and bilateral Secured at: 23 cm Tube secured with: Tape Dental Injury: Teeth and Oropharynx as per pre-operative assessment  Comments: Elective glidescope due to large neck, tongue, limited neck mobility. No difficulty with glidescope intubation.

## 2021-01-08 ENCOUNTER — Encounter (HOSPITAL_COMMUNITY): Payer: Self-pay | Admitting: Neurological Surgery

## 2021-01-30 ENCOUNTER — Other Ambulatory Visit: Payer: Self-pay | Admitting: Cardiology

## 2021-02-01 DIAGNOSIS — D631 Anemia in chronic kidney disease: Secondary | ICD-10-CM | POA: Diagnosis not present

## 2021-02-01 DIAGNOSIS — N2581 Secondary hyperparathyroidism of renal origin: Secondary | ICD-10-CM | POA: Diagnosis not present

## 2021-02-01 DIAGNOSIS — I129 Hypertensive chronic kidney disease with stage 1 through stage 4 chronic kidney disease, or unspecified chronic kidney disease: Secondary | ICD-10-CM | POA: Diagnosis not present

## 2021-02-01 DIAGNOSIS — N1832 Chronic kidney disease, stage 3b: Secondary | ICD-10-CM | POA: Diagnosis not present

## 2021-02-01 DIAGNOSIS — N189 Chronic kidney disease, unspecified: Secondary | ICD-10-CM | POA: Diagnosis not present

## 2021-03-13 ENCOUNTER — Other Ambulatory Visit: Payer: Self-pay | Admitting: Cardiology

## 2021-03-21 DIAGNOSIS — G4733 Obstructive sleep apnea (adult) (pediatric): Secondary | ICD-10-CM | POA: Diagnosis not present

## 2021-03-22 ENCOUNTER — Encounter: Payer: Self-pay | Admitting: Cardiology

## 2021-03-22 ENCOUNTER — Ambulatory Visit (INDEPENDENT_AMBULATORY_CARE_PROVIDER_SITE_OTHER): Payer: BC Managed Care – PPO | Admitting: Cardiology

## 2021-03-22 ENCOUNTER — Other Ambulatory Visit: Payer: Self-pay

## 2021-03-22 VITALS — BP 142/64 | HR 75 | Ht 71.0 in | Wt 252.6 lb

## 2021-03-22 DIAGNOSIS — I2510 Atherosclerotic heart disease of native coronary artery without angina pectoris: Secondary | ICD-10-CM | POA: Diagnosis not present

## 2021-03-22 DIAGNOSIS — I2129 ST elevation (STEMI) myocardial infarction involving other sites: Secondary | ICD-10-CM | POA: Diagnosis not present

## 2021-03-22 DIAGNOSIS — E785 Hyperlipidemia, unspecified: Secondary | ICD-10-CM

## 2021-03-22 DIAGNOSIS — Z9861 Coronary angioplasty status: Secondary | ICD-10-CM | POA: Diagnosis not present

## 2021-03-22 DIAGNOSIS — I214 Non-ST elevation (NSTEMI) myocardial infarction: Secondary | ICD-10-CM

## 2021-03-22 DIAGNOSIS — I10 Essential (primary) hypertension: Secondary | ICD-10-CM

## 2021-03-22 DIAGNOSIS — N1832 Chronic kidney disease, stage 3b: Secondary | ICD-10-CM

## 2021-03-22 MED ORDER — CLOPIDOGREL BISULFATE 75 MG PO TABS: 75.0000 mg | ORAL_TABLET | Freq: Every day | ORAL | 3 refills | Status: DC

## 2021-03-22 NOTE — Progress Notes (Addendum)
Primary Care Provider: Glenis Smoker, MD Cardiologist: Glenetta Hew, MD Electrophysiologist: None Nephrologist: Dr. Carolynne Edouard  Clinic Note: Chief Complaint  Patient presents with   Follow-up    ~78-monthfollow-up with no recurrent anginal symptoms.  Blood pressure seems up and down.  Recovering from back surgery-still limited/deconditioned   Coronary Artery Disease    No angina symptoms.    ===================================  ASSESSMENT/PLAN   Problem List Items Addressed This Visit       Cardiology Problems   ST elevation myocardial infarction (STEMI) of lateral wall, subsequent episode of care ([facility] (Chronic)    Distant history of lateral STEMI back in 2011.  Infarct versus artifact noted on Myoview with "mild peri-infarct ischemia"  Not really having any further active anginal or heart failure symptoms since his most recent non-STEMI.      CAD S/P percutaneous coronary angioplasty - Primary (Chronic)    He is now just over a year out from his most recent non-STEMI and PCI.  Did well perioperatively having held clopidogrel. No active anginal symptoms.  Plan: Continue carvedilol with intention to increase to 1-1/2 mg twice daily based on PCP blood pressure follow-up. Continue amlodipine for blood pressure antianginal benefit along with hydralazine on current doses. Wean off vent.  She has no longer having chest pain. Continue pravastatin, low threshold to convert to rosuvastatin pending lab check. He is now >1 year from his most recent PCI. After completion of current bottle Brilinta, we will DC Brilinta and convert to Plavix 75 mg daily.  => Okay to hold Plavix 5 to 7 days preop for surgeries or procedures. If concern for bruising, could also stop aspirin.      Relevant Orders   EKG 12-Lead (Completed)   Lipid panel   Comprehensive metabolic panel   Non-ST elevation (NSTEMI) myocardial infarction (HCC) (Chronic)   Relevant Orders   EKG 12-Lead  (Completed)   Lipid panel   Comprehensive metabolic panel   Hyperlipidemia with target LDL less than 70 (Chronic)    Most recent labs are from August.  LDL was 81.  Not quite at the goal that we would like to be-on pravastatin.  Plan: Recheck labs and chemistry panel.  Low threshold to consider changing to rosuvastatin versus referral to CVRR.      Relevant Orders   Lipid panel   Comprehensive metabolic panel   Essential hypertension (Chronic)    [place]  I think his pressure cuff may be off a little bit. We do have room to increase his carvedilol dose up.  If his blood pressures are still up would recommend PCP increase carvedilol dose to 1-1/2 tablet twice daily. Otherwise continue with carvedilol, amlodipine, and hydralazine.  -> Need to consider diuretic.  Weaning off Imdur.      Relevant Orders   Lipid panel   Comprehensive metabolic panel     Other   CKD (chronic kidney disease), stage III (HCC) (Chronic)    Followed by nephrology.  Most recent creatinine was pretty high.  Still making urine. Perhaps this is the reason why he is not diuretic.       ===================================  HPI:    George SIZELOVEis a 51y.o. male with a PMH below who presents today for 5 month f/u following Back Sgx. - No major CV complaints..Marland Kitchen PMH notable for NSTEMI x2 (age 19-DES PCI D1, November 2021- DES PCI to LCx-OM3), with CRFs of HTN, HLD,  CKD-3, mild obesity with OSA-CPAP who presents today for 25-monthpreop follow-up..   11/2009: ?STEMI - DES [facility]1.  [facility] Patent stent on relook cath 11/2010 02/06/2020 [facility] Severe stenosis of LCx OM 3-DES PCI (discharged on aspirin Brilinta) D1-mid in-stent 70% restenosis; LCx-OM 3 proximal 80%, RCA mid 60 -65% => PCI OM 3-Synergy DES 2.5 mm x 20 mm (2.6 mm)  George Bluewas last seen on 10/20/2020 for f/u & Pre-op eval (Back Sgx).   Surgery was considered to be relatively urgent, therefore he recommended going proceed despite it only being 11 months since PCI. ->  Cleared to hold Brilinta for surgery.  Blood pressures are running a little high.  We converted from Toprol to carvedilol and these PRN hydralazine for SBP greater than 150.  Recent Hospitalizations:  January 07, 2021:Minimal invasive microdiscectomy on the right L4-5  Reviewed  CV studies:    The following studies were reviewed today: (if available, images/films reviewed: From Epic Chart or Care Everywhere) None:  Interval History:   George SHEANreturns for his first visit following surgery.  His back is notably improved, but he still does have some low back pain and his activity is limited by back pain.  He is not really been on to get back and exercise, but is open to gradually build back.  He is quite deconditioned as such he is little bit short of breath when he does things.  He does not have any chest pain or pressure at rest or exertion of his anginal type symptoms.  Other than being deconditioned, he is doing okay from a car standpoint.  He says his blood pressure usually better than they are here today.  Usually in the 120/80 mmHg range.  His blood pressure was 122/80 at his nephrologist office recently. Still uses CPAP, without it he will get PND type symptoms with shortness of breath.  No baseline orthopnea or any notable edema.  CV Review of Symptoms (Summary) Cardiovascular ROS: positive for - dyspnea on exertion and this is more related to deconditioning negative for - edema, irregular heartbeat, orthopnea, palpitations, paroxysmal nocturnal dyspnea, rapid heart rate, shortness of breath, or lightheadedness or dizziness, syncope/near syncope or TIA/amaurosis fugax or claudication  REVIEWED OF SYSTEMS   Review of Systems  Constitutional:  Negative for malaise/fatigue (Mostly just fatigue due to deconditioning.  Limited by back pain.) and  weight loss (He has gained weight since his back surgery.).  HENT:  Negative for congestion and nosebleeds.   Respiratory:  Positive for shortness of breath (Occasional episodes of difficulty catching his breath.  (Consistent with Brilinta side effect)). Negative for cough.        Exertional dyspnea due to deconditioning  Cardiovascular:        Per HPI  Gastrointestinal:  Negative for blood in stool and melena.  Genitourinary:  Negative for hematuria.  Musculoskeletal:  Positive for back pain (No notable radiculopathy.).  Neurological:  Positive for weakness (His lower EXTR feel weaker since surgery, but mostly because he has not been exercising.). Negative for dizziness.  Psychiatric/Behavioral:  Negative for depression and memory loss. The patient is nervous/anxious.    I have reviewed and (if needed) personally updated the patient's problem list, medications, allergies, past medical and surgical history, social and family history.   PAST MEDICAL HISTORY   Past Medical History:  Diagnosis Date   Allergic rhinitis due to pollen    Arthritis  lower back   CAD S/P DES PCI D1 & OM3 11/15/2009   a) NSTEMI - 100% D1 (DES PCI - Promus DES 2.25 x 23 -- 2.3 mm); otherwise minimal CAD in a co-dominant system;; b) 02/06/20: OM3 80% (DES PCI), D1 70% ISR, mRCA 60-65%.    Chronic left shoulder pain    Impingement syndrome   Erectile dysfunction    Gout    Hyperlipidemia with target LDL less than 70    Associated CAD   Hypertension    Hypertensive kidney disease with CKD stage IV (HCC)    Cr as of August 2021 1.92   Non-ST elevation (NSTEMI) myocardial infarction ([place]) 11/2009; 01/2020   a) 9/'11: Cath - 100% D1 --> DES PCI Promus 2.25 x 23 (2.3 mm).  EF 55% with anterolateral HK. -> patent stent in Sept 2012 & 2015; b) 02/05/2020: NSTEMI - Echo EF >55% no RWMA - 80% OM3 (DES PCI), 70% D1 ISR, 60-65% mRCA.   Obesity (BMI 30.0-34.9)    OSA on CPAP    uses cpap   Vertigo     PAST SURGICAL  HISTORY   Past Surgical History:  Procedure Laterality Date   CORONARY STENT INTERVENTION  02/06/2020   [place], [place] -> Dr. Gretta Cool): NSTEMI: OM3 80% -> DES PCI: Synergy DES 2.5 x 20 - > 2.6 mm   CORONARY STENT PLACEMENT  11/15/2009   (Dr. Burt Knack) -- DES PCI of 100% D1 - Promus DES 2.25 x 23 --> 2.3 mm   HERNIA REPAIR     hernia repair at birth   LEFT HEART CATH AND CORONARY ANGIOGRAPHY  11/15/2009   (Dr. Burt Knack): NSTEMI -- LM-< LAD & LCx. ~mLAD ~30-40% just past D1 (D1 - 100% - CULPRIT - PCI), LCx normal - 2 OM & LPL. Dom RCA with Large PDA (small PL), ~30% mRCA.  EF ~55% with Ant-Lat HK    LEFT HEART CATH AND CORONARY ANGIOGRAPHY  11/22/2010   (Dr. Wynonia Lawman): Patent Diag 1 stent with minimal CAD elsewhere.  EF ~55-60% with Anterolateral HK   LEFT HEART CATH AND CORONARY ANGIOGRAPHY  12/31/2013   [facility] - for Abnormal Myoview) --> Non-obstructive CAD with widely patent D1 stent.  LVEDP ~16 mmHg   LEFT HEART CATH AND CORONARY ANGIOGRAPHY  02/06/2020   [place], [place] -> Dr. Elsie Amis): NSTEMI: D1 70% mid stent ISR; OM3 80% (-> DES PCI), mRCA 60-65%.     LUMBAR LAMINECTOMY/ DECOMPRESSION WITH MET-RX N/A 01/07/2021   Procedure: Minimally Invasive Microdiscectomy right Lumbar four-Five, Left Lumbar five-Sacral one;  Surgeon: Dawley, Theodoro Doing, DO;  Location: [place];  Service: Neurosurgery;  Laterality: N/A;   NASAL SINUS SURGERY  2010   For sleep apnea   NM MYOVIEW LTD  12/05/2019   EF> 65%.  Fixed anterior perfusion defect with normal motion suggesting artifact.  Read as LOW RISK -> but NOT NORMAL   TM MYOVIEW  12/2013   (FALSE POSITIVE) + TM - lateral ST depressions (also noted with recovery - near syncope) -> Reversible Anterior Wall Perfusion defect - CRO Ischemia; Partialy reversible Inferior defect - ~ diaphragmatic attenuation. EF ~48%. --> Referred for CATH --> FALSE POSTIIVE   TRANSTHORACIC ECHOCARDIOGRAM  07/08/2014   EF 60%.  No RWMA. Mild LA dilation. Normal walves.   TRANSTHORACIC ECHOCARDIOGRAM  02/05/2020   [facility] [place], [place] -> Dr. Elsie Amis): NSTEMI: EF> 55%. No R WMA. Normal valves.    Immunization  History  Administered Date(s) Administered   Moderna Sars-Covid-2 Vaccination 05/23/2019, 06/20/2019    MEDICATIONS/ALLERGIES   Current Meds  Medication Sig   amLODipine (NORVASC) 10 MG tablet Take 10 mg by mouth daily.   aspirin EC 81 MG tablet Take 81 mg by mouth daily. Swallow whole.   BRILINTA 90 MG TABS tablet Take 1 tablet by mouth twice daily   carvedilol (COREG) 6.25 MG tablet Take 1 tablet (6.25 mg total) by mouth 2 (two) times daily.   Cholecalciferol (VITAMIN D) 50 MCG (2000 UT) CAPS Take 2,000 Units by mouth daily.   clopidogrel (PLAVIX) 75 MG tablet Take 1 tablet (75 mg total) by mouth daily. (Start after you complete Brilinta)   cyclobenzaprine (FLEXERIL) 10 MG tablet Take 10 mg by mouth 3 (three) times daily as needed for muscle spasms.   famotidine (PEPCID) 20 MG tablet Take 20 mg by mouth at bedtime as needed for indigestion or heartburn.   gabapentin (NEURONTIN) 100 MG capsule Take 100 mg by mouth 2 (two) times daily.   hydrALAZINE (APRESOLINE) 100 MG tablet TAKE 1 TABLET BY MOUTH TWICE DAILY MAY  TAKE  AN  ADDITIONAL  TABLET  IF  SYSTOLIC  BLOOD  PRESSURE  IS  GREATER  THAN  150   HYDROcodone-acetaminophen (NORCO/VICODIN) 5-325 MG tablet Take 1-2 tablets by mouth every 6 (six) hours as needed for moderate pain.   montelukast (SINGULAIR) 10 MG tablet Take 10 mg by mouth daily.   nitroGLYCERIN (NITROSTAT) 0.4 MG SL tablet Place 1 tablet (0.4 mg total) under the tongue every 5 (five) minutes as needed for chest pain.   pravastatin (PRAVACHOL) 40 MG tablet TAKE 1 TABLET BY MOUTH ONCE DAILY IN THE EVENING   PROAIR RESPICLICK 950 (90 Base) MCG/ACT AEPB Inhale 2 puffs into the lungs every 4 (four) hours as needed (SOB/Wheezing).   sennosides-docusate sodium (SENOKOT-S)  8.6-50 MG tablet Take 1 tablet by mouth daily as needed for constipation.   vitamin C (ASCORBIC ACID) 500 MG tablet Take 500 mg by mouth daily.   [DISCONTINUED] isosorbide mononitrate (IMDUR) 30 MG 24 hr tablet Take 1 tablet by mouth once daily    Allergies  Allergen Reactions   Niacin And Related Itching   Zocor [Simvastatin-High Dose]     Muscle aches    SOCIAL HISTORY/FAMILY HISTORY   Reviewed in Epic:  Pertinent findings:  Social History   Tobacco Use   Smoking status: Never   Smokeless tobacco: Never  Vaping Use   Vaping Use: Never used  Substance Use Topics   Alcohol use: Yes    Alcohol/week: 1.0 standard drink    Types: 1 Standard drinks or equivalent per week    Comment: Rare, Occasional glass of wine   Drug use: No   Social History   Social History Narrative   He is a married father of 2 girls.   Public relations account executive   Lifelong non-smoker.   Rare glass of wine   Minimal caffeine intake, no longer drinks coffee only drinks intermittent sweet tea.  Rarely would have a Sprite.      Walks daily at work, does not get routine exercise.    OBJCTIVE -PE, EKG, labs   Wt Readings from Last 3 Encounters:  03/22/21 252 lb 9.6 oz (114.6 kg)  01/07/21 244 lb (110.7 kg)  12/29/20 244 lb 4.8 oz (110.8 kg)    Physical Exam: BP (!) 142/64    Pulse 75    Ht _0  (1.803 m)  Wt 252 lb 9.6 oz (114.6 kg)    SpO2 96%    BMI 35.23 kg/m  Physical Exam Vitals reviewed.  Constitutional:      Appearance: Normal appearance.  HENT:     Head: Normocephalic and atraumatic.  Neck:     Vascular: No carotid bruit or JVD.  Cardiovascular:     Rate and Rhythm: Normal rate and regular rhythm. No extrasystoles are present.    Chest Wall: PMI is not displaced.     Pulses: Normal pulses.     Heart sounds: S1 normal and S2 normal. Heart sounds are distant. No murmur heard.   No friction rub. No gallop.  Pulmonary:     Effort: Pulmonary effort is normal.  No respiratory distress.     Breath sounds: Normal breath sounds. No wheezing, rhonchi or rales.  Chest:     Chest wall: No tenderness.  Musculoskeletal:        General: Swelling (Trivial) present.     Cervical back: Normal range of motion and neck supple.  Neurological:     General: No focal deficit present.     Mental Status: He is alert and oriented to person, place, and time.     Gait: Gait abnormal.  Psychiatric:        Mood and Affect: Mood normal.        Behavior: Behavior normal.        Thought Content: Thought content normal.        Judgment: Judgment normal.    Adult ECG Report  Rate: 75 ;  Rhythm: normal sinus rhythm and inferolateral T wave inversions.  Otherwise normal axis, intervals durations. ;   Narrative Interpretation: Stable  Recent Labs:   10/26/2020: TC 151, TG 118, HDL 49, LDL 81. ->  Probably due to have labs rechecked in about February timeframe by PCP.  Lab Results  Component Value Date   CREATININE 2.74 (H) 12/29/2020   BUN 29 (H) 12/29/2020   NA 140 12/29/2020   K 3.7 12/29/2020   CL 110 12/29/2020   CO2 22 12/29/2020   CBC Latest Ref Rng & Units 12/29/2020 08/17/2020 03/27/2020  WBC 4.0 - 10.5 K/uL 12.5(H) 18.7(H) 16.2(H)  Hemoglobin 13.0 - 17.0 g/dL 10.2(L) 10.7(L) 13.2  Hematocrit 39.0 - 52.0 % 27.2(L) 29.7(L) 36.6(L)  Platelets 150 - 400 K/uL 283 283 304    Lab Results  Component Value Date   HGBA1C 4.2 (L) 07/07/2014   Lab Results  Component Value Date   TSH 0.259 (L) 07/07/2014    ==================================================  COVID-19 Education: The signs and symptoms of COVID-19 were discussed with the patient and how to seek care for testing (follow up with PCP or arrange E-visit).    I spent a total of 25 minutes with the patient spent in direct patient consultation.  Additional time spent with chart review  / charting (studies, outside notes, etc): 17 min Total Time: 42 min  Current medicines are reviewed at length with  the patient today.  (+/- concerns) asked about dyspnea-likely related to [place].  This visit occurred during the SARS-CoV-2 public health emergency.  Safety protocols were in place, including screening questions prior to the visit, additional usage of staff PPE, and extensive cleaning of exam room while observing appropriate contact time as indicated for disinfecting solutions.  Notice: This dictation was prepared with Dragon dictation along with smart phrase technology. Any transcriptional errors that result from this process are unintentional and may not be corrected upon review.  Studies Ordered:   Orders Placed This Encounter  Procedures   Lipid panel   Comprehensive metabolic panel   EKG [id number]    Patient Instructions / Medication Changes & Studies & Tests Ordered   Patient Instructions  Medication Instructions:   Stop taking Imdur   Once you complete bottle of BRILINTA stop then start taking Clopidogrel /Plavix 75 mg - for thr first 2 days take 2 tablet then go to one tablet a day    *If you need a refill on your cardiac medications before your next appointment, please call your pharmacy*   Lab Work:  may do at primary office if they allow Lipid- fasting  Cmp  If you have labs (blood work) drawn today and your tests are completely normal, you will receive your results only by: MyChart Message (if you have MyChart) OR A paper copy in the mail If you have any lab test that is abnormal or we need to change your treatment, we will call you to review the results.   Testing/Procedures:  Not needed   Follow-Up: At [facility], you and your health needs are our priority.  As part of our continuing mission to provide you with exceptional heart care, we have created designated Provider Care Teams.  These Care Teams include your primary Cardiologist (physician) and Advanced Practice Providers (APPs -  Physician Assistants and Nurse Practitioners) who all work together to  provide you with the care you need, when you need it.    Your next appointment:   10 month(s) NOV 2023  The format for your next appointment:   In Person  Provider:   Glenetta Hew, MD            Glenetta Hew, M.D., M.S. Interventional Cardiologist   Pager # [phone] Phone # [phone] [address] [place], [place] [address]   Thank you for choosing Heartcare at [facility]!!

## 2021-03-22 NOTE — Patient Instructions (Signed)
Medication Instructions:   Stop taking Imdur   Once you complete bottle of BRILINTA stop then start taking Clopidogrel /Plavix 75 mg - for thr first 2 days take 2 tablet then go to one tablet a day    *If you need a refill on your cardiac medications before your next appointment, please call your pharmacy*   Lab Work:  may do at primary office if they allow Lipid- fasting  Cmp  If you have labs (blood work) drawn today and your tests are completely normal, you will receive your results only by: [place] (if you have MyChart) OR A paper copy in the mail If you have any lab test that is abnormal or we need to change your treatment, we will call you to review the results.   Testing/Procedures:  Not needed   Follow-Up: At [facility], you and your health needs are our priority.  As part of our continuing mission to provide you with exceptional heart care, we have created designated Provider Care Teams.  These Care Teams include your primary Cardiologist (physician) and Advanced Practice Providers (APPs -  Physician Assistants and Nurse Practitioners) who all work together to provide you with the care you need, when you need it.    Your next appointment:   10 month(s) NOV 2023  The format for your next appointment:   In Person  Provider:   Glenetta Hew, MD

## 2021-03-30 ENCOUNTER — Encounter: Payer: Self-pay | Admitting: Cardiology

## 2021-03-30 DIAGNOSIS — M4316 Spondylolisthesis, lumbar region: Secondary | ICD-10-CM | POA: Diagnosis not present

## 2021-03-30 DIAGNOSIS — I10 Essential (primary) hypertension: Secondary | ICD-10-CM | POA: Diagnosis not present

## 2021-03-30 DIAGNOSIS — R739 Hyperglycemia, unspecified: Secondary | ICD-10-CM | POA: Diagnosis not present

## 2021-03-30 DIAGNOSIS — M5416 Radiculopathy, lumbar region: Secondary | ICD-10-CM | POA: Diagnosis not present

## 2021-03-30 DIAGNOSIS — E7800 Pure hypercholesterolemia, unspecified: Secondary | ICD-10-CM | POA: Diagnosis not present

## 2021-03-30 DIAGNOSIS — N184 Chronic kidney disease, stage 4 (severe): Secondary | ICD-10-CM | POA: Diagnosis not present

## 2021-03-30 DIAGNOSIS — M5136 Other intervertebral disc degeneration, lumbar region: Secondary | ICD-10-CM | POA: Diagnosis not present

## 2021-03-30 DIAGNOSIS — M47816 Spondylosis without myelopathy or radiculopathy, lumbar region: Secondary | ICD-10-CM | POA: Diagnosis not present

## 2021-03-30 NOTE — Assessment & Plan Note (Signed)
Distant history of lateral STEMI back in 2011.  Infarct versus artifact noted on Myoview with "mild peri-infarct ischemia"  Not really having any further active anginal or heart failure symptoms since his most recent non-STEMI.

## 2021-03-30 NOTE — Assessment & Plan Note (Signed)
Blood pressure is high in the clinic, [place] [facility].  I think his pressure cuff may be off a little bit. We do have room to increase his carvedilol dose up.  If his blood pressures are still up would recommend PCP increase carvedilol dose to 1-1/2 tablet twice daily. Otherwise continue with carvedilol, amlodipine, and hydralazine.  -> Need to consider diuretic.  Weaning off Imdur.

## 2021-03-30 NOTE — Assessment & Plan Note (Signed)
He is now just over a year out from his most recent non-STEMI and PCI.  Did well perioperatively having held clopidogrel. No active anginal symptoms.  Plan:  Continue carvedilol with intention to increase to 1-1/2 mg twice daily based on PCP blood pressure follow-up.  Continue amlodipine for blood pressure antianginal benefit along with hydralazine on current doses.  Wean off vent.  She has no longer having chest pain.  Continue pravastatin, low threshold to convert to rosuvastatin pending lab check.  He is now >1 year from his most recent PCI.  After completion of current bottle Brilinta, we will DC Brilinta and convert to Plavix 75 mg daily.  => Okay to hold Plavix 5 to 7 days preop for surgeries or procedures.  If concern for bruising, could also stop aspirin.

## 2021-03-30 NOTE — Assessment & Plan Note (Signed)
Most recent labs are from August.  LDL was 81.  Not quite at the goal that we would like to be-on pravastatin.  Plan: Recheck labs and chemistry panel.  Low threshold to consider changing to rosuvastatin versus referral to CVRR.

## 2021-03-30 NOTE — Assessment & Plan Note (Signed)
Followed by nephrology.  Most recent creatinine was pretty high.  Still making urine. Perhaps this is the reason why he is not diuretic.

## 2021-04-01 DIAGNOSIS — M5116 Intervertebral disc disorders with radiculopathy, lumbar region: Secondary | ICD-10-CM | POA: Diagnosis not present

## 2021-04-07 DIAGNOSIS — M5116 Intervertebral disc disorders with radiculopathy, lumbar region: Secondary | ICD-10-CM | POA: Diagnosis not present

## 2021-04-12 DIAGNOSIS — M5116 Intervertebral disc disorders with radiculopathy, lumbar region: Secondary | ICD-10-CM | POA: Diagnosis not present

## 2021-04-19 DIAGNOSIS — M5116 Intervertebral disc disorders with radiculopathy, lumbar region: Secondary | ICD-10-CM | POA: Diagnosis not present

## 2021-04-21 DIAGNOSIS — M5116 Intervertebral disc disorders with radiculopathy, lumbar region: Secondary | ICD-10-CM | POA: Diagnosis not present

## 2021-04-21 DIAGNOSIS — G4733 Obstructive sleep apnea (adult) (pediatric): Secondary | ICD-10-CM | POA: Diagnosis not present

## 2021-04-26 DIAGNOSIS — M5116 Intervertebral disc disorders with radiculopathy, lumbar region: Secondary | ICD-10-CM | POA: Diagnosis not present

## 2021-04-28 DIAGNOSIS — M5116 Intervertebral disc disorders with radiculopathy, lumbar region: Secondary | ICD-10-CM | POA: Diagnosis not present

## 2021-05-11 DIAGNOSIS — M5126 Other intervertebral disc displacement, lumbar region: Secondary | ICD-10-CM | POA: Diagnosis not present

## 2021-05-11 DIAGNOSIS — M5136 Other intervertebral disc degeneration, lumbar region: Secondary | ICD-10-CM | POA: Diagnosis not present

## 2021-05-11 DIAGNOSIS — M5416 Radiculopathy, lumbar region: Secondary | ICD-10-CM | POA: Diagnosis not present

## 2021-05-11 DIAGNOSIS — M4316 Spondylolisthesis, lumbar region: Secondary | ICD-10-CM | POA: Diagnosis not present

## 2021-05-31 DIAGNOSIS — N189 Chronic kidney disease, unspecified: Secondary | ICD-10-CM | POA: Diagnosis not present

## 2021-05-31 DIAGNOSIS — N1832 Chronic kidney disease, stage 3b: Secondary | ICD-10-CM | POA: Diagnosis not present

## 2021-06-09 DIAGNOSIS — N184 Chronic kidney disease, stage 4 (severe): Secondary | ICD-10-CM | POA: Diagnosis not present

## 2021-06-09 DIAGNOSIS — I129 Hypertensive chronic kidney disease with stage 1 through stage 4 chronic kidney disease, or unspecified chronic kidney disease: Secondary | ICD-10-CM | POA: Diagnosis not present

## 2021-06-09 DIAGNOSIS — D631 Anemia in chronic kidney disease: Secondary | ICD-10-CM | POA: Diagnosis not present

## 2021-06-09 DIAGNOSIS — N2581 Secondary hyperparathyroidism of renal origin: Secondary | ICD-10-CM | POA: Diagnosis not present

## 2021-06-21 DIAGNOSIS — J343 Hypertrophy of nasal turbinates: Secondary | ICD-10-CM | POA: Diagnosis not present

## 2021-06-21 DIAGNOSIS — G4733 Obstructive sleep apnea (adult) (pediatric): Secondary | ICD-10-CM | POA: Diagnosis not present

## 2021-06-21 DIAGNOSIS — Z9989 Dependence on other enabling machines and devices: Secondary | ICD-10-CM | POA: Diagnosis not present

## 2021-06-21 DIAGNOSIS — Z9889 Other specified postprocedural states: Secondary | ICD-10-CM | POA: Diagnosis not present

## 2021-07-04 DIAGNOSIS — G4733 Obstructive sleep apnea (adult) (pediatric): Secondary | ICD-10-CM | POA: Diagnosis not present

## 2021-07-07 ENCOUNTER — Other Ambulatory Visit: Payer: Self-pay | Admitting: Cardiology

## 2021-08-01 DIAGNOSIS — G4733 Obstructive sleep apnea (adult) (pediatric): Secondary | ICD-10-CM | POA: Diagnosis not present

## 2021-09-11 NOTE — Progress Notes (Signed)
Labs scanned from PCPs office: 03/30/2021  Na+ 142, K+ 4.3, Cl- 109, HCO3-24, BUN 39, Cr 2.93, Glu 110, Ca2+ 9.2; AST 65, ALT 18, AlkP 19 Lipid panel not done.  Kidney function still reduced.  Relatively stable compared to October 2022. Being seen by nephrology.  Glenetta Hew, MD

## 2021-09-25 ENCOUNTER — Other Ambulatory Visit: Payer: Self-pay | Admitting: Cardiology

## 2021-09-26 DIAGNOSIS — N184 Chronic kidney disease, stage 4 (severe): Secondary | ICD-10-CM | POA: Diagnosis not present

## 2021-10-05 DIAGNOSIS — D631 Anemia in chronic kidney disease: Secondary | ICD-10-CM | POA: Diagnosis not present

## 2021-10-05 DIAGNOSIS — N184 Chronic kidney disease, stage 4 (severe): Secondary | ICD-10-CM | POA: Diagnosis not present

## 2021-10-05 DIAGNOSIS — N2581 Secondary hyperparathyroidism of renal origin: Secondary | ICD-10-CM | POA: Diagnosis not present

## 2021-10-05 DIAGNOSIS — I129 Hypertensive chronic kidney disease with stage 1 through stage 4 chronic kidney disease, or unspecified chronic kidney disease: Secondary | ICD-10-CM | POA: Diagnosis not present

## 2021-10-07 ENCOUNTER — Encounter: Payer: Self-pay | Admitting: Cardiology

## 2021-10-10 NOTE — Telephone Encounter (Signed)
We routinely give people with heart failure Procrit.  So it should be safe.  The goal is to keep your iron levels and your blood counts stable to help your kidney function but also it helps your heart.  [place]

## 2021-10-26 DIAGNOSIS — Z79899 Other long term (current) drug therapy: Secondary | ICD-10-CM | POA: Diagnosis not present

## 2021-10-26 DIAGNOSIS — Z125 Encounter for screening for malignant neoplasm of prostate: Secondary | ICD-10-CM | POA: Diagnosis not present

## 2021-10-26 DIAGNOSIS — Z0000 Encounter for general adult medical examination without abnormal findings: Secondary | ICD-10-CM | POA: Diagnosis not present

## 2021-10-26 DIAGNOSIS — E7800 Pure hypercholesterolemia, unspecified: Secondary | ICD-10-CM | POA: Diagnosis not present

## 2021-10-31 ENCOUNTER — Other Ambulatory Visit: Payer: Self-pay | Admitting: Cardiology

## 2021-11-04 ENCOUNTER — Other Ambulatory Visit (HOSPITAL_COMMUNITY): Payer: Self-pay | Admitting: *Deleted

## 2021-11-04 DIAGNOSIS — N184 Chronic kidney disease, stage 4 (severe): Secondary | ICD-10-CM

## 2021-11-07 ENCOUNTER — Ambulatory Visit (HOSPITAL_COMMUNITY)
Admission: RE | Admit: 2021-11-07 | Discharge: 2021-11-07 | Disposition: A | Discharge status: Home / Self Care | Payer: BC Managed Care – PPO | Source: Ambulatory Visit | Attending: Nephrology | Admitting: Nephrology

## 2021-11-07 VITALS — BP 136/77 | HR 63 | Temp 97.4°F | Resp 16 | Ht 71.0 in | Wt 250.0 lb

## 2021-11-07 DIAGNOSIS — N183 Chronic kidney disease, stage 3 unspecified: Secondary | ICD-10-CM | POA: Insufficient documentation

## 2021-11-07 MED ORDER — EPOETIN ALFA 20000 UNIT/ML IJ SOLN
INTRAMUSCULAR | Status: AC
  Administered 2021-11-07: 20000 [IU]
  Filled 2021-11-07: qty 1

## 2021-11-07 MED ORDER — EPOETIN ALFA 20000 UNIT/ML IJ SOLN: 20000.0000 [IU] | Freq: Once | INTRAMUSCULAR | Status: DC

## 2021-11-07 MED ORDER — EPOETIN ALFA-EPBX 40000 UNIT/ML IJ SOLN: 20000.0000 [IU] | INTRAMUSCULAR | Status: DC

## 2021-12-05 ENCOUNTER — Ambulatory Visit (HOSPITAL_COMMUNITY)
Admission: RE | Admit: 2021-12-05 | Discharge: 2021-12-05 | Disposition: A | Discharge status: Home / Self Care | Payer: BC Managed Care – PPO | Source: Ambulatory Visit | Attending: Nephrology | Admitting: Nephrology

## 2021-12-05 VITALS — BP 156/83 | HR 62 | Temp 97.3°F | Resp 16

## 2021-12-05 DIAGNOSIS — N183 Chronic kidney disease, stage 3 unspecified: Secondary | ICD-10-CM | POA: Diagnosis not present

## 2021-12-05 LAB — IRON AND TIBC
Iron: 90 ug/dL (ref 45–182)
Saturation Ratios: 32 % (ref 17.9–39.5)
TIBC: 280 ug/dL (ref 250–450)
UIBC: 190 ug/dL

## 2021-12-05 LAB — POCT HEMOGLOBIN-HEMACUE: Hemoglobin: 8.5 g/dL — ABNORMAL LOW (ref 13.0–17.0)

## 2021-12-05 LAB — FERRITIN: Ferritin: 261 ng/mL (ref 24–336)

## 2021-12-05 MED ORDER — EPOETIN ALFA-EPBX 10000 UNIT/ML IJ SOLN: 20000.0000 [IU] | INTRAMUSCULAR | Status: DC

## 2021-12-05 MED ORDER — EPOETIN ALFA-EPBX 10000 UNIT/ML IJ SOLN
INTRAMUSCULAR | Status: AC
  Administered 2021-12-05: 20000 [IU] via SUBCUTANEOUS
  Filled 2021-12-05: qty 2

## 2021-12-19 ENCOUNTER — Ambulatory Visit (INDEPENDENT_AMBULATORY_CARE_PROVIDER_SITE_OTHER): Payer: BC Managed Care – PPO | Admitting: Gastroenterology

## 2021-12-19 ENCOUNTER — Telehealth: Payer: Self-pay

## 2021-12-19 ENCOUNTER — Encounter: Payer: Self-pay | Admitting: Gastroenterology

## 2021-12-19 VITALS — BP 142/72 | HR 60 | Ht 72.0 in | Wt 238.1 lb

## 2021-12-19 DIAGNOSIS — D631 Anemia in chronic kidney disease: Secondary | ICD-10-CM

## 2021-12-19 DIAGNOSIS — K5909 Other constipation: Secondary | ICD-10-CM | POA: Diagnosis not present

## 2021-12-19 DIAGNOSIS — N184 Chronic kidney disease, stage 4 (severe): Secondary | ICD-10-CM | POA: Diagnosis not present

## 2021-12-19 DIAGNOSIS — Z1211 Encounter for screening for malignant neoplasm of colon: Secondary | ICD-10-CM

## 2021-12-19 MED ORDER — PEG 3350-KCL-NA BICARB-NACL 420 G PO SOLR: 4000.0000 mL | Freq: Once | ORAL | 0 refills | Status: AC

## 2021-12-19 NOTE — Telephone Encounter (Signed)
[facility] Pre-operative Risk Assessment     Request for surgical clearance:     Endoscopy Procedure  What type of surgery is being performed?     Colonoscopy   When is this surgery scheduled?     01-26-2022  What type of clearance is required ?   Pharmacy  Are there any medications that need to be held prior to surgery and how long? Yes, Plavix, 5 days  Practice name and name of physician performing surgery?      [facility]  What is your office phone and fax number?      Phone- [phone]  [phone]  Anesthesia type (None, local, MAC, general) ?       MAC

## 2021-12-19 NOTE — Telephone Encounter (Signed)
Primary Cardiologist:David Ellyn Hack, MD   Preoperative team, please contact this patient and set up a phone call appointment for further preoperative risk assessment. Please obtain consent and complete medication review. Thank you for your help.   Per Dr. Ellyn Hack, he may hold Plavix for 5 days prior to procedure. He should resume as soon as possible post-procedure.   Emmaline Life, NP-C  12/19/2021, 4:40 PM [address], [address] Office [phone] Fax [phone]

## 2021-12-19 NOTE — Progress Notes (Addendum)
[facility] progress note:  History: George Erickson 12/19/2021  Referring provider: Shon Hale, MD  Reason for consult/chief complaint: Colonoscopy ( Discuss Colonoscopy, issues having BM with straining , BM once a week )   Subjective  HPI: George Erickson was seen here August 2022 for consideration of colon cancer screening.  At that point he was 9 months out from a non-STEMI with drug-eluting stent placement.  He was on aspirin and Brilinta at that time.  His cardiologist prefer the patient only be off Brilinta 1 time to have both his colonoscopy on a planned back surgery in the same window of time, but that was not logistically feasible. At the time of office evaluation he had about 6 months of constipation attributed at least in part to as needed use of opiate analgesic for back pain.  He had back surgery performed October 2022. Today he is still troubled by constipation, reports having a bowel movement about once a week and needs to strain.  (Last thyroid testing in this EMR was in 2016)  George Erickson has continued to struggle with constipation, sometimes going once a week and always with the need to force and strain.  He denies rectal bleeding.  Like to get his colonoscopy scheduled soon for screening purposes. His renal function has apparently worsened in recent years and he sees Dr. Ronalee Belts at [facility]. George Erickson also has chronic anemia and CKD and recently received Epogen treatments.  (Most recently 12/05/2021) He tells me that the plan is to check hemoglobin and get another imaging treatment at the end of this month and, if not improving as expected, possibly receive Epogen every 2 weeks. ROS:  Review of Systems  Constitutional:  Negative for appetite change and unexpected weight change.  HENT:  Negative for mouth sores and voice change.   Eyes:  Negative for pain and redness.  Respiratory:  Negative for cough and shortness of breath.    Cardiovascular:  Negative for chest pain and palpitations.  Genitourinary:  Negative for dysuria and hematuria.  Musculoskeletal:  Negative for arthralgias and myalgias.  Skin:  Negative for pallor and rash.  Neurological:  Negative for weakness and headaches.  Hematological:  Negative for adenopathy.     Past Medical History: Past Medical History:  Diagnosis Date   Allergic rhinitis due to pollen    Arthritis    lower back   CAD S/P DES PCI D1 & OM3 11/15/2009   a) NSTEMI - 100% D1 (DES PCI - Promus DES 2.25 x 23 -- 2.3 mm); otherwise minimal CAD in a co-dominant system;; b) 02/06/20: OM3 80% (DES PCI), D1 70% ISR, mRCA 60-65%.    Chronic left shoulder pain    Impingement syndrome   Erectile dysfunction    Gout    Hyperlipidemia with target LDL less than 70    Associated CAD   Hypertension    Hypertensive kidney disease with CKD stage IV (HCC)    Cr as of August 2021 1.92   Non-ST elevation (NSTEMI) myocardial infarction (HCC) 11/2009; 01/2020   a) 9/'11: Cath - 100% D1 --> DES PCI Promus 2.25 x 23 (2.3 mm).  EF 55% with anterolateral HK. -> patent stent in Sept 2012 & 2015; b) 02/05/2020: NSTEMI - Echo EF >55% no RWMA - 80% OM3 (DES PCI), 70% D1 ISR, 60-65% mRCA.   Obesity (BMI 30.0-34.9)    OSA on CPAP    uses cpap   Vertigo    From his last cardiology office  note on 03/22/2021: "He is now >1 year from his most recent PCI. After completion of current bottle Brilinta, we will DC Brilinta and convert to Plavix 75 mg daily.  => Okay to hold Plavix 5 to 7 days preop for surgeries or procedures"  Past Surgical History: Past Surgical History:  Procedure Laterality Date   CORONARY STENT INTERVENTION  02/06/2020   [facility], [place] -> Dr. Gretta Cool): NSTEMI: OM3 80% -> DES PCI: Synergy DES 2.5 x 20 - > 2.6 mm   CORONARY STENT PLACEMENT  11/15/2009   (Dr. Burt Knack) -- DES PCI of 100% D1 - Promus DES 2.25 x 23 --> 2.3 mm   HERNIA REPAIR     hernia  repair at birth   LEFT HEART CATH AND CORONARY ANGIOGRAPHY  11/15/2009   (Dr. Burt Knack): NSTEMI -- LM-< LAD & LCx. ~mLAD ~30-40% just past D1 (D1 - 100% - CULPRIT - PCI), LCx normal - 2 OM & LPL. Dom RCA with Large PDA (small PL), ~30% mRCA.  EF ~55% with Ant-Lat HK    LEFT HEART CATH AND CORONARY ANGIOGRAPHY  11/22/2010   (Dr. Wynonia Lawman): Patent Diag 1 stent with minimal CAD elsewhere.  EF ~55-60% with Anterolateral HK   LEFT HEART CATH AND CORONARY ANGIOGRAPHY  12/31/2013   [facility] - for Abnormal Myoview) --> Non-obstructive CAD with widely patent D1 stent.  LVEDP ~16 mmHg   LEFT HEART CATH AND CORONARY ANGIOGRAPHY  02/06/2020   [facility] [place], [place] -> Dr. Elsie Amis): NSTEMI: D1 70% mid stent ISR; OM3 80% (-> DES PCI), mRCA 60-65%.     LUMBAR LAMINECTOMY/ DECOMPRESSION WITH MET-RX N/A 01/07/2021   Procedure: Minimally Invasive Microdiscectomy right Lumbar four-Five, Left Lumbar five-Sacral one;  Surgeon: Dawley, Theodoro Doing, DO;  Location: [facility];  Service: Neurosurgery;  Laterality: N/A;   NASAL SINUS SURGERY  2010   For sleep apnea   NM MYOVIEW LTD  12/05/2019   EF> 65%.  Fixed anterior perfusion defect with normal motion suggesting artifact.  Read as LOW RISK -> but NOT NORMAL   TM MYOVIEW  12/2013   (FALSE POSITIVE) + TM - lateral ST depressions (also noted with recovery - near syncope) -> Reversible Anterior Wall Perfusion defect - CRO Ischemia; Partialy reversible Inferior defect - ~ diaphragmatic attenuation. EF ~48%. --> Referred for CATH --> FALSE POSTIIVE   TRANSTHORACIC ECHOCARDIOGRAM  07/08/2014   EF 60%. No RWMA. Mild LA dilation. Normal walves.   TRANSTHORACIC ECHOCARDIOGRAM  02/05/2020   [facility] [place], [place] -> Dr. Elsie Amis): NSTEMI: EF> 55%. No R WMA. Normal valves.     Family History: Family History  Problem Relation Age of Onset   Hypertension Mother    Coronary artery disease Mother 51       s/p CABG   Diabetes Mellitus II Mother     Hyperlipidemia Mother    Hypertension Father    Heart attack Father 71       Had several after   Diabetes Mellitus II Father    Heart failure Father    CVA Father 62   Hyperlipidemia Father    CVA Sister    Hypothyroidism Sister    Hypertension Sister    Healthy Brother    Healthy Brother    Hypertension Sister    Heart attack Sister     Social History: Social History   Socioeconomic History   Marital status: Married    Spouse name: Not on file  Number of children: 2   Years of education: Not on file   Highest education level: Bachelor's degree (e.g., BA, AB, BS)  Occupational History   Occupation: Comptroller    Employer: [employer]  Tobacco Use   Smoking status: Never   Smokeless tobacco: Never  Vaping Use   Vaping Use: Never used  Substance and Sexual Activity   Alcohol use: Yes    Alcohol/week: 1.0 standard drink of alcohol    Types: 1 Standard drinks or equivalent per week    Comment: Rare, Occasional glass of wine   Drug use: No   Sexual activity: Not on file  Other Topics Concern   Not on file  Social History Narrative   He is a married father of 2 girls.   Sales executive   Lifelong non-smoker.   Rare glass of wine   Minimal caffeine intake, no longer drinks coffee only drinks intermittent sweet tea.  Rarely would have a Sprite.      Walks daily at work, does not get routine exercise.   Social Determinants of Health   Financial Resource Strain: Not on file  Food Insecurity: Not on file  Transportation Needs: Not on file  Physical Activity: Not on file  Stress: Not on file  Social Connections: Not on file    Allergies: Allergies  Allergen Reactions   Niacin And Related Itching   Zocor [Simvastatin-High Dose]     Muscle aches    Outpatient Meds: Current Outpatient Medications  Medication Sig Dispense Refill   amLODipine (NORVASC) 10 MG tablet Take 1 tablet by mouth daily.     aspirin 81 MG  chewable tablet Chew by mouth.     calcitRIOL (ROCALTROL) 0.25 MCG capsule Take 0.25 mcg by mouth daily.     carvedilol (COREG) 6.25 MG tablet Take 1 tablet by mouth twice daily 180 tablet 2   Cholecalciferol (VITAMIN D) 50 MCG (2000 UT) tablet 1 tablet     clopidogrel (PLAVIX) 75 MG tablet Take 1 tablet (75 mg total) by mouth daily. (Start after you complete Brilinta) 90 tablet 3   epoetin alfa (PROCRIT) 69169 UNIT/ML injection      nitroGLYCERIN (NITROSTAT) 0.4 MG SL tablet Place 1 tablet (0.4 mg total) under the tongue every 5 (five) minutes as needed for chest pain. 25 tablet 6   nitroGLYCERIN (NITROSTAT) 0.4 MG SL tablet See admin instructions.     polyethylene glycol-electrolytes (NULYTELY) 420 g solution Take 4,000 mLs by mouth once for 1 dose. For colonoscopy prep 4000 mL 0   pravastatin (PRAVACHOL) 40 MG tablet TAKE 1 TABLET BY MOUTH ONCE DAILY IN THE EVENING 90 tablet 3   PROAIR RESPICLICK 108 (90 Base) MCG/ACT AEPB Inhale 2 puffs into the lungs every 4 (four) hours as needed (SOB/Wheezing).     sennosides-docusate sodium (SENOKOT-S) 8.6-50 MG tablet Take 1 tablet by mouth daily as needed for constipation.     sodium bicarbonate 650 MG tablet Take 650 mg by mouth 2 (two) times daily.     vitamin C (ASCORBIC ACID) 500 MG tablet Take 500 mg by mouth daily.     BRILINTA 90 MG TABS tablet Take 1 tablet by mouth twice daily 180 tablet 0   cyclobenzaprine (FLEXERIL) 10 MG tablet Take 10 mg by mouth 3 (three) times daily as needed for muscle spasms.     famotidine (PEPCID) 20 MG tablet Take 20 mg by mouth at bedtime as needed for indigestion or heartburn.  gabapentin (NEURONTIN) 100 MG capsule Take 100 mg by mouth 2 (two) times daily.     hydrALAZINE (APRESOLINE) 100 MG tablet TAKE 1 TABLET BY MOUTH TWICE DAILY. MAY  TAKE  ADDITIONAL  TABLET  IF  SYSTOLIC  BLOOD  PRESSURE  IS  GREATER  THAN  150 180 tablet 3   HYDROcodone-acetaminophen (NORCO/VICODIN) 5-325 MG tablet Take 1-2 tablets by  mouth every 6 (six) hours as needed for moderate pain. 30 tablet 0   montelukast (SINGULAIR) 10 MG tablet Take 10 mg by mouth daily.     No current facility-administered medications for this visit.      ___________________________________________________________________ Objective   Exam:  BP (!) 142/72   Pulse 60   Ht 6' (1.829 m)   Wt 238 lb 2 oz (108 kg)   BMI 32.30 kg/m  Wt Readings from Last 3 Encounters:  12/19/21 238 lb 2 oz (108 kg)  11/07/21 250 lb (113.4 kg)  03/22/21 252 lb 9.6 oz (114.6 kg)    General: Well-appearing Eyes: sclera anicteric, no redness ENT: oral mucosa moist without lesions, no cervical or supraclavicular lymphadenopathy CV: Regular without murmur, no JVD, no peripheral edema Resp: clear to auscultation bilaterally, normal RR and effort noted GI: soft, no tenderness, with active bowel sounds. No guarding or palpable organomegaly noted. Skin; warm and dry, no rash or jaundice noted Neuro: awake, alert and oriented x 3. Normal gross motor function and fluent speech  Labs:     Latest Ref Rng & Units 12/05/2021   12:32 PM 12/29/2020    8:24 AM 08/17/2020    2:32 AM  CBC  WBC 4.0 - 10.5 K/uL  12.5  18.7   Hemoglobin 13.0 - 17.0 g/dL 8.5  10.2  10.7   Hematocrit 39.0 - 52.0 %  27.2  29.7   Platelets 150 - 400 K/uL  283  283       Latest Ref Rng & Units 12/29/2020    8:24 AM 08/17/2020    2:32 AM 03/27/2020    8:39 PM  CMP  Glucose 70 - 99 mg/dL 117  109  126   BUN 6 - 20 mg/dL $Remove'29  25  18   'lZBiazm$ Creatinine 0.61 - 1.24 mg/dL 2.74  2.20  2.27   Sodium 135 - 145 mmol/L 140  139  140   Potassium 3.5 - 5.1 mmol/L 3.7  4.0  3.8   Chloride 98 - 111 mmol/L 110  107  108   CO2 22 - 32 mmol/L $RemoveB'22  25  22   'DQgDCPRZ$ Calcium 8.9 - 10.3 mg/dL 8.9  8.8  8.8    Normal iron studies 12/05/2021 (the day of his second admission shot)  He had his [facility] care portal I was able to access lab results to see that he did not had a TSH checked for at least the last 12 to 18  months.  Assessment: Encounter Diagnoses  Name Primary?   Chronic constipation Yes   Anemia in stage 4 chronic kidney disease (HCC)    Special screening for malignant neoplasms, colon     Chronic constipation of unclear cause.  He admits he does not get enough fruits and vegetables, he is reasonably active walking as part of his job.  Most likely motility condition, less likely obstructive given how long it has been occurring.  Anemia chronic kidney disease, hemoglobin recently 8.5 at the time of second erythropoietin dose.  While he does need a colonoscopy for colorectal cancer screening  purposes, that should wait until his hemoglobin is improved before undergoing sedation, especially considering his underlying CAD.  Plan: TSH and free T4 today  Start 1 capful of MiraLAX glass of water daily  Colonoscopy.  We have tentatively scheduled it for mid-to-late November so we can see his next hemoglobin at the time of her epoetin injection at the end of this month.  If hemoglobin is not improving to at least 10, I will plan to delay his colonoscopy.  The benefits and risks of the planned procedure were described in detail with the patient or (when appropriate) their health care proxy.  Risks were outlined as including, but not limited to, bleeding, infection, perforation, adverse medication reaction leading to cardiac or pulmonary decompensation, pancreatitis (if ERCP).  The limitation of incomplete mucosal visualization was also discussed.  No guarantees or warranties were given. Plavix must be held 5 days before procedure.  His cardiologist is already authorized this in their last clinic note (see above)  Patient at increased risk for cardiopulmonary complications of procedure due to medical comorbidities.  (Note forwarded to both primary care and nephrology.  George Erickson says he will ask Dr.Bhandari to send me the late October hemoglobin result, and I also asked him to send it to me through this  portal) Thank you for the courtesy of this consult.  Please call me with any questions or concerns.  Nelida Meuse III  CC: Referring provider noted above Lawson Radar, MD ([facility])

## 2021-12-19 NOTE — Patient Instructions (Addendum)
_______________________________________________________  If you are age 51 or older, your body mass index should be between 23-30. Your Body mass index is 32.3 kg/m. If this is out of the aforementioned range listed, please consider follow up with your Primary Care Provider.  If you are age 51 or younger, your body mass index should be between 19-25. Your Body mass index is 32.3 kg/m. If this is out of the aformentioned range listed, please consider follow up with your Primary Care Provider.   ________________________________________________________  The [facility] providers would like to encourage you to use [facility] to communicate with providers for non-urgent requests or questions.  Due to long hold times on the telephone, sending your provider a message by [facility] may be a faster and more efficient way to get a response.  Please allow 48 business hours for a response.  Please remember that this is for non-urgent requests.  _______________________________________________________  Dennis Bast have been scheduled for a colonoscopy. Please follow written instructions given to you at your visit today.  Please pick up your prep supplies at the pharmacy within the next 1-3 days. If you use inhalers (even only as needed), please bring them with you on the day of your procedure.   You will be contacted by our office prior to your procedure for directions on holding your Plavix.  If you do not hear from our office 1 week prior to your scheduled procedure, please call [phone] to discuss.    Due to recent changes in healthcare laws, you may see the results of your imaging and laboratory studies on MyChart before your provider has had a chance to review them.  We understand that in some cases there may be results that are confusing or concerning to you. Not all laboratory results come back in the same time frame and the provider may be waiting for multiple results in order to interpret others.  Please give [place]  48 hours in order for your provider to thoroughly review all the results before contacting the office for clarification of your results.   It was a pleasure to see you today!  Thank you for trusting me with your gastrointestinal care!     Marland Kitchen

## 2021-12-21 NOTE — Telephone Encounter (Signed)
Left message for the pt to call back for tele pre op appt.  ?

## 2021-12-22 ENCOUNTER — Telehealth: Payer: Self-pay | Admitting: *Deleted

## 2021-12-22 NOTE — Telephone Encounter (Signed)
Pt agreeable to plan of care for tele pre op appt 01/11/22 @ 2:20 med rec and consent are done.

## 2021-12-22 NOTE — Telephone Encounter (Signed)
Pt agreeable to plan of care for tele pre op appt 01/11/22 @ 2:20 med rec and consent are done.     Patient Consent for Virtual Visit        George Erickson has provided verbal consent on 12/22/2021 for a virtual visit (video or telephone).   CONSENT FOR VIRTUAL VISIT FOR:  George Erickson  By participating in this virtual visit I agree to the following:  I hereby voluntarily request, consent and authorize [facility] and its employed or contracted physicians, physician assistants, nurse practitioners or other licensed health care professionals (the Practitioner), to provide me with telemedicine health care services (the "Services") as deemed necessary by the treating Practitioner. I acknowledge and consent to receive the Services by the Practitioner via telemedicine. I understand that the telemedicine visit will involve communicating with the Practitioner through live audiovisual communication technology and the disclosure of certain medical information by electronic transmission. I acknowledge that I have been given the opportunity to request an in-person assessment or other available alternative prior to the telemedicine visit and am voluntarily participating in the telemedicine visit.  I understand that I have the right to withhold or withdraw my consent to the use of telemedicine in the course of my care at any time, without affecting my right to future care or treatment, and that the Practitioner or I may terminate the telemedicine visit at any time. I understand that I have the right to inspect all information obtained and/or recorded in the course of the telemedicine visit and may receive copies of available information for a reasonable fee.  I understand that some of the potential risks of receiving the Services via telemedicine include:  Delay or interruption in medical evaluation due to technological equipment failure or disruption; Information transmitted may not be  sufficient (e.g. poor resolution of images) to allow for appropriate medical decision making by the Practitioner; and/or  In rare instances, security protocols could fail, causing a breach of personal health information.  Furthermore, I acknowledge that it is my responsibility to provide information about my medical history, conditions and care that is complete and accurate to the best of my ability. I acknowledge that Practitioner's advice, recommendations, and/or decision may be based on factors not within their control, such as incomplete or inaccurate data provided by me or distortions of diagnostic images or specimens that may result from electronic transmissions. I understand that the practice of medicine is not an exact science and that Practitioner makes no warranties or guarantees regarding treatment outcomes. I acknowledge that a copy of this consent can be made available to me via my patient portal ([facility]), or I can request a printed copy by calling the office of [facility].    I understand that my insurance will be billed for this visit.   I have read or had this consent read to me. I understand the contents of this consent, which adequately explains the benefits and risks of the Services being provided via telemedicine.  I have been provided ample opportunity to ask questions regarding this consent and the Services and have had my questions answered to my satisfaction. I give my informed consent for the services to be provided through the use of telemedicine in my medical care

## 2022-01-02 ENCOUNTER — Ambulatory Visit (HOSPITAL_COMMUNITY)
Admission: RE | Admit: 2022-01-02 | Discharge: 2022-01-02 | Disposition: A | Discharge status: Home / Self Care | Payer: BC Managed Care – PPO | Source: Ambulatory Visit | Attending: Nephrology | Admitting: Nephrology

## 2022-01-02 VITALS — BP 137/82 | HR 62 | Temp 97.6°F

## 2022-01-02 DIAGNOSIS — N183 Chronic kidney disease, stage 3 unspecified: Secondary | ICD-10-CM | POA: Diagnosis present

## 2022-01-02 LAB — IRON AND TIBC
Iron: 61 ug/dL (ref 45–182)
Saturation Ratios: 24 % (ref 17.9–39.5)
TIBC: 251 ug/dL (ref 250–450)
UIBC: 190 ug/dL

## 2022-01-02 LAB — POCT HEMOGLOBIN-HEMACUE: Hemoglobin: 9.1 g/dL — ABNORMAL LOW (ref 13.0–17.0)

## 2022-01-02 LAB — FERRITIN: Ferritin: 215 ng/mL (ref 24–336)

## 2022-01-02 MED ORDER — EPOETIN ALFA-EPBX 10000 UNIT/ML IJ SOLN
20000.0000 [IU] | INTRAMUSCULAR | Status: DC
  Administered 2022-01-02: 20000 [IU] via SUBCUTANEOUS

## 2022-01-02 MED ORDER — EPOETIN ALFA-EPBX 10000 UNIT/ML IJ SOLN
INTRAMUSCULAR | Status: AC
  Filled 2022-01-02: qty 2

## 2022-01-10 ENCOUNTER — Encounter: Payer: Self-pay | Admitting: Gastroenterology

## 2022-01-10 NOTE — Telephone Encounter (Signed)
I appreciate him sending the lab work.  While I had hoped for hemoglobin closer to 10 by the time of his colonoscopy, I agree that it may just not be feasible given his CKD and sickle cell.  I recommend proceeding with colonoscopy as scheduled on 01/26/22.  HD

## 2022-01-11 ENCOUNTER — Ambulatory Visit: Payer: BC Managed Care – PPO | Attending: Cardiology | Admitting: Physician Assistant

## 2022-01-11 DIAGNOSIS — Z01810 Encounter for preprocedural cardiovascular examination: Secondary | ICD-10-CM

## 2022-01-11 NOTE — Progress Notes (Signed)
Virtual Visit via Telephone Note   Because of George Erickson co-morbid illnesses, he is at least at moderate risk for complications without adequate follow up.  This format is felt to be most appropriate for this patient at this time.  The patient did not have access to video technology/had technical difficulties with video requiring transitioning to audio format only (telephone).  All issues noted in this document were discussed and addressed.  No physical exam could be performed with this format.  Please refer to the patient's chart for his consent to telehealth for [facility].  Evaluation Performed:  Preoperative cardiovascular risk assessment _____________   Date:  01/11/2022   Patient ID:  George Erickson, DOB September 22, 1970, MRN [id number] Patient Location:  Home Provider location:   Office  Primary Care Provider:  Glenis Smoker, MD Primary Cardiologist:  Glenetta Hew, MD  Chief Complaint / Patient Profile   51 y.o. y/o male with a h/o CAD s/p DES PCI D1 and OM3 who is pending colonoscopy and presents today for telephonic preoperative cardiovascular risk assessment.  Past Medical History    Past Medical History:  Diagnosis Date   Allergic rhinitis due to pollen    Arthritis    lower back   CAD S/P DES PCI D1 & OM3 11/15/2009   a) NSTEMI - 100% D1 (DES PCI - Promus DES 2.25 x 23 -- 2.3 mm); otherwise minimal CAD in a co-dominant system;; b) 02/06/20: OM3 80% (DES PCI), D1 70% ISR, mRCA 60-65%.    Chronic left shoulder pain    Impingement syndrome   Erectile dysfunction    Gout    Hyperlipidemia with target LDL less than 70    Associated CAD   Hypertension    Hypertensive kidney disease with CKD stage IV (HCC)    Cr as of August 2021 1.92   Non-ST elevation (NSTEMI) myocardial infarction ([place]) 11/2009; 01/2020   a) 9/'11: Cath - 100% D1 --> DES PCI Promus 2.25 x 23 (2.3 mm).  EF 55% with anterolateral HK. -> patent stent in Sept 2012 & 2015; b)  02/05/2020: NSTEMI - Echo EF >55% no RWMA - 80% OM3 (DES PCI), 70% D1 ISR, 60-65% mRCA.   Obesity (BMI 30.0-34.9)    OSA on CPAP    uses cpap   Vertigo    Past Surgical History:  Procedure Laterality Date   CORONARY STENT INTERVENTION  02/06/2020   [place], [place] -> Dr. Gretta Cool): NSTEMI: OM3 80% -> DES PCI: Synergy DES 2.5 x 20 - > 2.6 mm   CORONARY STENT PLACEMENT  11/15/2009   (Dr. Burt Knack) -- DES PCI of 100% D1 - Promus DES 2.25 x 23 --> 2.3 mm   HERNIA REPAIR     hernia repair at birth   LEFT HEART CATH AND CORONARY ANGIOGRAPHY  11/15/2009   (Dr. Burt Knack): NSTEMI -- LM-< LAD & LCx. ~mLAD ~30-40% just past D1 (D1 - 100% - CULPRIT - PCI), LCx normal - 2 OM & LPL. Dom RCA with Large PDA (small PL), ~30% mRCA.  EF ~55% with Ant-Lat HK    LEFT HEART CATH AND CORONARY ANGIOGRAPHY  11/22/2010   (Dr. Wynonia Lawman): Patent Diag 1 stent with minimal CAD elsewhere.  EF ~55-60% with Anterolateral HK   LEFT HEART CATH AND CORONARY ANGIOGRAPHY  12/31/2013   [facility] - for Abnormal Myoview) --> Non-obstructive CAD with widely patent D1 stent.  LVEDP ~16 mmHg   LEFT HEART CATH AND CORONARY  ANGIOGRAPHY  02/06/2020   [place], [place] -> Dr. Elsie Amis): NSTEMI: D1 70% mid stent ISR; OM3 80% (-> DES PCI), mRCA 60-65%.     LUMBAR LAMINECTOMY/ DECOMPRESSION WITH MET-RX N/A 01/07/2021   Procedure: Minimally Invasive Microdiscectomy right Lumbar four-Five, Left Lumbar five-Sacral one;  Surgeon: Dawley, Theodoro Doing, DO;  Location: [facility] [facility];  Service: Neurosurgery;  Laterality: N/A;   NASAL SINUS SURGERY  2010   For sleep apnea   NM MYOVIEW LTD  12/05/2019   EF> 65%.  Fixed anterior perfusion defect with normal motion suggesting artifact.  Read as LOW RISK -> but NOT NORMAL   TM MYOVIEW  12/2013   (FALSE POSITIVE) + TM - lateral ST depressions (also noted with recovery - near syncope) -> Reversible Anterior Wall Perfusion defect - CRO Ischemia; Partialy reversible  Inferior defect - ~ diaphragmatic attenuation. EF ~48%. --> Referred for CATH --> FALSE POSTIIVE   TRANSTHORACIC ECHOCARDIOGRAM  07/08/2014   EF 60%. No RWMA. Mild LA dilation. Normal walves.   TRANSTHORACIC ECHOCARDIOGRAM  02/05/2020   [facility] [place], [place] -> Dr. Elsie Amis): NSTEMI: EF> 55%. No R WMA. Normal valves.    Allergies  Allergies  Allergen Reactions   Niacin And Related Itching   Zocor [Simvastatin-High Dose]     Muscle aches    History of Present Illness    George Erickson is a 51 y.o. male who presents via audio/video conferencing for a telehealth visit today.  Pt was last seen in cardiology clinic on 03/22/21 by Dr. Ellyn Hack.  At that time George Erickson was doing well.  The patient is now pending procedure as outlined above. Since his last visit, he is feeling pretty good.  No major issues other than his kidneys.  No shortness of breath, chest pain, or swelling in his legs.  He states that he does occasionally get winded going up and down the stairs but this has been stable over the last year.  He also at times gets tired while doing activity especially outdoors.  This is due to his back pain and not due to his heart.  He enjoys recreational activities like fishing and hunting.  He has no issues with walking or going up and down stairs.  For this reason he is scored a 5.07 METS on the DASI this well exceeds the 4 METS minimum requirement.  Per Dr. Ellyn Hack, he may hold Plavix for 5 days prior to procedure. He should resume as soon as possible post-procedure.   Home Medications    Prior to Admission medications   Medication Sig Start Date End Date Taking? Authorizing Provider  amLODipine (NORVASC) 10 MG tablet Take 1 tablet by mouth daily.    [provider]  aspirin 81 MG chewable tablet Chew by mouth. 08/19/20   [provider]  BRILINTA 90 MG TABS tablet Take 1 tablet by mouth twice daily 01/31/21   Leonie Man, MD  calcitRIOL  (ROCALTROL) 0.25 MCG capsule Take 0.25 mcg by mouth daily. 11/28/21   [provider]  carvedilol (COREG) 6.25 MG tablet Take 1 tablet by mouth twice daily 09/27/21   Leonie Man, MD  Cholecalciferol (VITAMIN D) 50 MCG (2000 UT) tablet 1 tablet    [provider]  clopidogrel (PLAVIX) 75 MG tablet Take 1 tablet (75 mg total) by mouth daily. (Start after you complete Brilinta) 03/22/21   Leonie Man, MD  cyclobenzaprine (FLEXERIL) 10 MG tablet Take 10  mg by mouth 3 (three) times daily as needed for muscle spasms.    [provider]  epoetin alfa (PROCRIT) 77414 UNIT/ML injection  10/11/21   [provider]  famotidine (PEPCID) 20 MG tablet Take 20 mg by mouth at bedtime as needed for indigestion or heartburn. 06/05/19   [provider]  gabapentin (NEURONTIN) 100 MG capsule Take 100 mg by mouth 2 (two) times daily.    [provider]  hydrALAZINE (APRESOLINE) 100 MG tablet TAKE 1 TABLET BY MOUTH TWICE DAILY. MAY  TAKE  ADDITIONAL  TABLET  IF  SYSTOLIC  BLOOD  PRESSURE  IS  GREATER  THAN  150 07/08/21   Leonie Man, MD  montelukast (SINGULAIR) 10 MG tablet Take 10 mg by mouth daily.    [provider]  nitroGLYCERIN (NITROSTAT) 0.4 MG SL tablet Place 1 tablet (0.4 mg total) under the tongue every 5 (five) minutes as needed for chest pain. 01/23/20   Leonie Man, MD  nitroGLYCERIN (NITROSTAT) 0.4 MG SL tablet See admin instructions. 08/19/20   [provider]  pravastatin (PRAVACHOL) 40 MG tablet TAKE 1 TABLET BY MOUTH ONCE DAILY IN THE EVENING 10/31/21   Leonie Man, MD  PROAIR RESPICLICK [phone] Base) MCG/ACT AEPB Inhale 2 puffs into the lungs every 4 (four) hours as needed (SOB/Wheezing). 01/08/20   [provider]  sennosides-docusate sodium (SENOKOT-S) 8.6-50 MG tablet Take 1 tablet by mouth daily as needed for constipation.    [provider]  sodium bicarbonate 650 MG tablet Take 650 mg by  mouth 2 (two) times daily. 11/23/21   [provider]  vitamin C (ASCORBIC ACID) 500 MG tablet Take 500 mg by mouth daily.    [provider]    Physical Exam    Vital Signs:  George Erickson does not have vital signs available for review today.  Given telephonic nature of communication, physical exam is limited. AAOx3. NAD. Normal affect.  Speech and respirations are unlabored.  Accessory Clinical Findings    None  Assessment & Plan    1.  Preoperative Cardiovascular Risk Assessment:  George Erickson perioperative risk of a major cardiac event is 6.6% according to the Revised Cardiac Risk Index (RCRI).  Therefore, he is at high risk for perioperative complications.   His functional capacity is good at 5.07 METs according to the [place] Activity Status Index (DASI). Recommendations: According to ACC/AHA guidelines, no further cardiovascular testing needed.  The patient may proceed to surgery at acceptable risk.   Antiplatelet and/or Anticoagulation Recommendations: Clopidogrel (Plavix) can be held for 5 days prior to his surgery and resumed as soon as possible post op.  Time:   Today, I have spent 10 minutes with the patient with telehealth technology discussing medical history, symptoms, and management plan.     George Collard, PA-C  01/11/2022, 8:19 AM

## 2022-01-11 NOTE — Telephone Encounter (Signed)
Per Dr. Ellyn Hack, he may hold Plavix for 5 days prior to procedure. He should resume as soon as possible post-procedure.  Patient has been notified and aware to hold his Plavix 5 days prior to his procedure.

## 2022-01-19 ENCOUNTER — Encounter: Payer: Self-pay | Admitting: Certified Registered Nurse Anesthetist

## 2022-01-26 ENCOUNTER — Ambulatory Visit (AMBULATORY_SURGERY_CENTER): Payer: BC Managed Care – PPO | Admitting: Gastroenterology

## 2022-01-26 ENCOUNTER — Encounter: Payer: Self-pay | Admitting: Gastroenterology

## 2022-01-26 VITALS — BP 125/68 | HR 52 | Temp 98.4°F | Resp 16 | Ht 72.0 in | Wt 238.0 lb

## 2022-01-26 DIAGNOSIS — K5730 Diverticulosis of large intestine without perforation or abscess without bleeding: Secondary | ICD-10-CM | POA: Diagnosis not present

## 2022-01-26 DIAGNOSIS — K5909 Other constipation: Secondary | ICD-10-CM | POA: Diagnosis present

## 2022-01-26 DIAGNOSIS — D125 Benign neoplasm of sigmoid colon: Secondary | ICD-10-CM

## 2022-01-26 DIAGNOSIS — K649 Unspecified hemorrhoids: Secondary | ICD-10-CM

## 2022-01-26 MED ORDER — SODIUM CHLORIDE 0.9 % IV SOLN: 500.0000 mL | Freq: Once | INTRAVENOUS | Status: DC

## 2022-01-26 NOTE — Patient Instructions (Addendum)
Handouts on polyps, hemorrhoids, and diverticulosis given to patient. Await pathology results. Resume previous diet and continue present medications - resume Plavix (clopidogrel) at prior dose today 01/26/22 Consider using Miralax daily as discussed at the recent office visit Repeat colonoscopy for surveillance based off of pathology results.   YOU HAD AN ENDOSCOPIC PROCEDURE TODAY AT [facility]:   Refer to the procedure report that was given to you for any specific questions about what was found during the examination.  If the procedure report does not answer your questions, please call your gastroenterologist to clarify.  If you requested that your care partner not be given the details of your procedure findings, then the procedure report has been included in a sealed envelope for you to review at your convenience later.  YOU SHOULD EXPECT: Some feelings of bloating in the abdomen. Passage of more gas than usual.  Walking can help get rid of the air that was put into your GI tract during the procedure and reduce the bloating. If you had a lower endoscopy (such as a colonoscopy or flexible sigmoidoscopy) you may notice spotting of blood in your stool or on the toilet paper. If you underwent a bowel prep for your procedure, you may not have a normal bowel movement for a few days.  Please Note:  You might notice some irritation and congestion in your nose or some drainage.  This is from the oxygen used during your procedure.  There is no need for concern and it should clear up in a day or so.  SYMPTOMS TO REPORT IMMEDIATELY:  Following lower endoscopy (colonoscopy or flexible sigmoidoscopy):  Excessive amounts of blood in the stool  Significant tenderness or worsening of abdominal pains  Swelling of the abdomen that is new, acute  Fever of 100F or higher  For urgent or emergent issues, a gastroenterologist can be reached at any hour by calling [phone]. Do not use  MyChart messaging for urgent concerns.    DIET:  We do recommend a small meal at first, but then you may proceed to your regular diet.  Drink plenty of fluids but you should avoid alcoholic beverages for 24 hours.  ACTIVITY:  You should plan to take it easy for the rest of today and you should NOT DRIVE or use heavy machinery until tomorrow (because of the sedation medicines used during the test).    FOLLOW UP: Our staff will call the number listed on your records the next business day following your procedure.  We will call around 7:15- 8:00 am to check on you and address any questions or concerns that you may have regarding the information given to you following your procedure. If we do not reach you, we will leave a message.     If any biopsies were taken you will be contacted by phone or by letter within the next 1-3 weeks.  Please call us at [phone] if you have not heard about the biopsies in 3 weeks.    SIGNATURES/CONFIDENTIALITY: You and/or your care partner have signed paperwork which will be entered into your electronic medical record.  These signatures attest to the fact that that the information above on your After Visit Summary has been reviewed and is understood.  Full responsibility of the confidentiality of this discharge information lies with you and/or your care-partner.

## 2022-01-26 NOTE — Op Note (Signed)
[facility] Patient Name: Imanuel Pruiett Procedure Date: 01/26/2022 8:35 AM MRN: [id number] Endoscopist: Mallie Mussel L. Loletha Carrow , MD, [id number] Age: 51 Referring MD:  Date of Birth: November 15, 1970 Gender: Male Account #: [id number] Procedure:                Colonoscopy Indications:              Chronic constipation Medicines:                Monitored Anesthesia Care Procedure:                Pre-Anesthesia Assessment:                           - Prior to the procedure, a History and Physical                            was performed, and patient medications and                            allergies were reviewed. The patient's tolerance of                            previous anesthesia was also reviewed. The risks                            and benefits of the procedure and the sedation                            options and risks were discussed with the patient.                            All questions were answered, and informed consent                            was obtained. Prior Anticoagulants: The patient has                            taken Plavix (clopidogrel), last dose was 5 days                            prior to procedure. After reviewing the risks and                            benefits, the patient was deemed in satisfactory                            condition to undergo the procedure.                           After obtaining informed consent, the colonoscope                            was passed under direct vision. Throughout the  procedure, the patient's blood pressure, pulse, and                            oxygen saturations were monitored continuously. The                            Olympus CF-HQ190L (Serial# [id number]) Colonoscope was                            introduced through the anus and advanced to the the                            cecum, identified by appendiceal orifice and                            ileocecal valve. The colonoscopy  was performed                            without difficulty. The patient tolerated the                            procedure well. The quality of the bowel                            preparation was good. The ileocecal valve,                            appendiceal orifice, and rectum were photographed. Scope In: 8:43:48 AM Scope Out: 9:00:00 AM Scope Withdrawal Time: 0 hours 11 minutes 25 seconds  Total Procedure Duration: 0 hours 16 minutes 12 seconds  Findings:                 The perianal and digital rectal examinations were                            normal.                           Repeat examination of right colon under NBI                            performed.                           A diminutive polyp was found in the proximal                            sigmoid colon. The polyp was sessile. The polyp was                            removed with a cold snare. Resection and retrieval                            were complete.  Internal hemorrhoids were found.                           A few diverticula were found in the right colon.                           The exam was otherwise without abnormality on                            direct and retroflexion views. Complications:            No immediate complications. Estimated Blood Loss:     Estimated blood loss was minimal. Impression:               - One diminutive polyp in the proximal sigmoid                            colon, removed with a cold snare. Resected and                            retrieved.                           - Internal hemorrhoids.                           - The examination was otherwise normal on direct                            and retroflexion views. Recommendation:           - Patient has a contact number available for                            emergencies. The signs and symptoms of potential                            delayed complications were discussed with the                             patient. Return to normal activities tomorrow.                            Written discharge instructions were provided to the                            patient.                           - Resume previous diet.                           - Resume Plavix (clopidogrel) at prior dose today.                           - Await pathology results.                           -  Repeat colonoscopy is recommended for                            surveillance. The colonoscopy date will be                            determined after pathology results from today's                            exam become available for review.                           - Consider using miralax daily as discussed at the                            recent office visit.                           See me in the office as needed. [name], MD 01/26/2022 9:06:03 AM This report has been signed electronically.

## 2022-01-26 NOTE — Progress Notes (Signed)
Called to room to assist during endoscopic procedure.  Patient ID and intended procedure confirmed with present staff. Received instructions for my participation in the procedure from the performing physician.  

## 2022-01-26 NOTE — Progress Notes (Signed)
History and Physical:  This patient presents for endoscopic testing for: Encounter Diagnosis  Name Primary?   Chronic constipation Yes    51 year old man with chronic constipation, clinical details in office consult note of 12/19/2021. He also has anemia from both chronic kidney disease and sickle trait.  He has recently been receiving Epogen infusions with nephrology. Has not yet tried miralax after office visit Remains on aspirin, has held Plavix last 5 days. Patient is otherwise without complaints or active issues today.   Past Medical History: Past Medical History:  Diagnosis Date   Allergic rhinitis due to pollen    Arthritis    lower back   CAD S/P DES PCI D1 & OM3 11/15/2009   a) NSTEMI - 100% D1 (DES PCI - Promus DES 2.25 x 23 -- 2.3 mm); otherwise minimal CAD in a co-dominant system;; b) 02/06/20: OM3 80% (DES PCI), D1 70% ISR, mRCA 60-65%.    Chronic left shoulder pain    Impingement syndrome   Erectile dysfunction    Gout    Hyperlipidemia with target LDL less than 70    Associated CAD   Hypertension    Hypertensive kidney disease with CKD stage IV (HCC)    Cr as of August 2021 1.92   Non-ST elevation (NSTEMI) myocardial infarction ([place]) 11/2009; 01/2020   a) 9/'11: Cath - 100% D1 --> DES PCI Promus 2.25 x 23 (2.3 mm).  EF 55% with anterolateral HK. -> patent stent in Sept 2012 & 2015; b) 02/05/2020: NSTEMI - Echo EF >55% no RWMA - 80% OM3 (DES PCI), 70% D1 ISR, 60-65% mRCA.   Obesity (BMI 30.0-34.9)    OSA on CPAP    uses cpap   Vertigo      Past Surgical History: Past Surgical History:  Procedure Laterality Date   CORONARY STENT INTERVENTION  02/06/2020   [facility], [place] -> Dr. Gretta Cool): NSTEMI: OM3 80% -> DES PCI: Synergy DES 2.5 x 20 - > 2.6 mm   CORONARY STENT PLACEMENT  11/15/2009   (Dr. Burt Knack) -- DES PCI of 100% D1 - Promus DES 2.25 x 23 --> 2.3 mm   HERNIA REPAIR     hernia repair at birth   LEFT HEART CATH AND  CORONARY ANGIOGRAPHY  11/15/2009   (Dr. Burt Knack): NSTEMI -- LM-< LAD & LCx. ~mLAD ~30-40% just past D1 (D1 - 100% - CULPRIT - PCI), LCx normal - 2 OM & LPL. Dom RCA with Large PDA (small PL), ~30% mRCA.  EF ~55% with Ant-Lat HK    LEFT HEART CATH AND CORONARY ANGIOGRAPHY  11/22/2010   (Dr. Wynonia Lawman): Patent Diag 1 stent with minimal CAD elsewhere.  EF ~55-60% with Anterolateral HK   LEFT HEART CATH AND CORONARY ANGIOGRAPHY  12/31/2013   [facility] - for Abnormal Myoview) --> Non-obstructive CAD with widely patent D1 stent.  LVEDP ~16 mmHg   LEFT HEART CATH AND CORONARY ANGIOGRAPHY  02/06/2020   [facility], [place] -> Dr. Elsie Amis): NSTEMI: D1 70% mid stent ISR; OM3 80% (-> DES PCI), mRCA 60-65%.     LUMBAR LAMINECTOMY/ DECOMPRESSION WITH MET-RX N/A 01/07/2021   Procedure: Minimally Invasive Microdiscectomy right Lumbar four-Five, Left Lumbar five-Sacral one;  Surgeon: Dawley, Theodoro Doing, DO;  Location: [facility];  Service: Neurosurgery;  Laterality: N/A;   NASAL SINUS SURGERY  2010   For sleep apnea   NM MYOVIEW LTD  12/05/2019   EF> 65%.  Fixed anterior perfusion defect with normal  motion suggesting artifact.  Read as LOW RISK -> but NOT NORMAL   TM MYOVIEW  12/2013   (FALSE POSITIVE) + TM - lateral ST depressions (also noted with recovery - near syncope) -> Reversible Anterior Wall Perfusion defect - CRO Ischemia; Partialy reversible Inferior defect - ~ diaphragmatic attenuation. EF ~48%. --> Referred for CATH --> FALSE POSTIIVE   TRANSTHORACIC ECHOCARDIOGRAM  07/08/2014   EF 60%. No RWMA. Mild LA dilation. Normal walves.   TRANSTHORACIC ECHOCARDIOGRAM  02/05/2020   [facility] [place], [place] -> Dr. Elsie Amis): NSTEMI: EF> 55%. No R WMA. Normal valves.    Allergies: Allergies  Allergen Reactions   Niacin And Related Itching   Zocor [Simvastatin-High Dose]     Muscle aches    Outpatient Meds: Current Outpatient Medications  Medication Sig Dispense Refill    amLODipine (NORVASC) 10 MG tablet Take 1 tablet by mouth daily.     aspirin 81 MG chewable tablet Chew by mouth.     calcitRIOL (ROCALTROL) 0.25 MCG capsule Take 0.25 mcg by mouth daily.     carvedilol (COREG) 6.25 MG tablet Take 1 tablet by mouth twice daily 180 tablet 2   clopidogrel (PLAVIX) 75 MG tablet Take 1 tablet (75 mg total) by mouth daily. (Start after you complete Brilinta) 90 tablet 3   pravastatin (PRAVACHOL) 40 MG tablet TAKE 1 TABLET BY MOUTH ONCE DAILY IN THE EVENING 90 tablet 3   sodium bicarbonate 650 MG tablet Take 650 mg by mouth 2 (two) times daily.     vitamin C (ASCORBIC ACID) 500 MG tablet Take 500 mg by mouth daily.     BRILINTA 90 MG TABS tablet Take 1 tablet by mouth twice daily 180 tablet 0   Cholecalciferol (VITAMIN D) 50 MCG (2000 UT) tablet 1 tablet     cyclobenzaprine (FLEXERIL) 10 MG tablet Take 10 mg by mouth 3 (three) times daily as needed for muscle spasms.     epoetin alfa (PROCRIT) 45809 UNIT/ML injection      famotidine (PEPCID) 20 MG tablet Take 20 mg by mouth at bedtime as needed for indigestion or heartburn.     gabapentin (NEURONTIN) 100 MG capsule Take 100 mg by mouth 2 (two) times daily.     hydrALAZINE (APRESOLINE) 100 MG tablet TAKE 1 TABLET BY MOUTH TWICE DAILY. MAY  TAKE  ADDITIONAL  TABLET  IF  SYSTOLIC  BLOOD  PRESSURE  IS  GREATER  THAN  150 180 tablet 3   montelukast (SINGULAIR) 10 MG tablet Take 10 mg by mouth daily.     nitroGLYCERIN (NITROSTAT) 0.4 MG SL tablet Place 1 tablet (0.4 mg total) under the tongue every 5 (five) minutes as needed for chest pain. 25 tablet 6   nitroGLYCERIN (NITROSTAT) 0.4 MG SL tablet See admin instructions.     PROAIR RESPICLICK 983 (90 Base) MCG/ACT AEPB Inhale 2 puffs into the lungs every 4 (four) hours as needed (SOB/Wheezing).     sennosides-docusate sodium (SENOKOT-S) 8.6-50 MG tablet Take 1 tablet by mouth daily as needed for constipation.     Current Facility-Administered Medications  Medication Dose  Route Frequency Provider Last Rate Last Admin   0.9 %  sodium chloride infusion  500 mL Intravenous Once Nelida Meuse III, MD          ___________________________________________________________________ Objective   Exam:  BP 124/68   Pulse (!) 56   Temp 98.4 F (36.9 C) (Temporal)   Ht 6' (1.829 m)   Wt  238 lb (108 kg)   SpO2 97%   BMI 32.28 kg/m   CV: regular , S1/S2 Resp: clear to auscultation bilaterally, normal RR and effort noted GI: soft, no tenderness, with active bowel sounds.   Assessment: Encounter Diagnosis  Name Primary?   Chronic constipation Yes     Plan: Colonoscopy  The benefits and risks of the planned procedure were described in detail with the patient or (when appropriate) their health care proxy.  Risks were outlined as including, but not limited to, bleeding, infection, perforation, adverse medication reaction leading to cardiac or pulmonary decompensation, pancreatitis (if ERCP).  The limitation of incomplete mucosal visualization was also discussed.  No guarantees or warranties were given.    The patient is appropriate for an endoscopic procedure in the ambulatory setting.   - Wilfrid Lund, MD

## 2022-01-26 NOTE — Progress Notes (Signed)
Pt's states no medical or surgical changes since previsit or office visit. 

## 2022-01-26 NOTE — Progress Notes (Signed)
Report given to PACU, vss 

## 2022-01-27 ENCOUNTER — Other Ambulatory Visit (HOSPITAL_COMMUNITY): Payer: Self-pay | Admitting: *Deleted

## 2022-01-27 ENCOUNTER — Telehealth: Payer: Self-pay | Admitting: *Deleted

## 2022-01-27 NOTE — Telephone Encounter (Signed)
  Follow up Call-     01/26/2022    7:40 AM  Call back number  Post procedure Call Back phone  # [phone]  Permission to leave phone message Yes     Patient questions:  Do you have a fever, pain , or abdominal swelling? No. Pain Score  0 *  Have you tolerated food without any problems? Yes.    Have you been able to return to your normal activities? Yes.    Do you have any questions about your discharge instructions: Diet   No. Medications  No. Follow up visit  No.  Do you have questions or concerns about your Care? No.  Actions: * If pain score is 4 or above: No action needed, pain <4.

## 2022-01-30 ENCOUNTER — Ambulatory Visit (HOSPITAL_COMMUNITY)
Admission: RE | Admit: 2022-01-30 | Discharge: 2022-01-30 | Disposition: A | Discharge status: Home / Self Care | Payer: BC Managed Care – PPO | Source: Ambulatory Visit | Attending: Nephrology | Admitting: Nephrology

## 2022-01-30 VITALS — BP 119/77 | HR 58 | Temp 98.3°F | Resp 17

## 2022-01-30 DIAGNOSIS — N183 Chronic kidney disease, stage 3 unspecified: Secondary | ICD-10-CM | POA: Insufficient documentation

## 2022-01-30 LAB — IRON AND TIBC
Iron: 102 ug/dL (ref 45–182)
Saturation Ratios: 39 % (ref 17.9–39.5)
TIBC: 260 ug/dL (ref 250–450)
UIBC: 158 ug/dL

## 2022-01-30 LAB — FERRITIN: Ferritin: 181 ng/mL (ref 24–336)

## 2022-01-30 LAB — POCT HEMOGLOBIN-HEMACUE: Hemoglobin: 9.6 g/dL — ABNORMAL LOW (ref 13.0–17.0)

## 2022-01-30 MED ORDER — EPOETIN ALFA-EPBX 10000 UNIT/ML IJ SOLN
INTRAMUSCULAR | Status: AC
  Administered 2022-01-30: 20000 [IU] via SUBCUTANEOUS
  Filled 2022-01-30: qty 2

## 2022-01-30 MED ORDER — EPOETIN ALFA-EPBX 10000 UNIT/ML IJ SOLN: 20000.0000 [IU] | INTRAMUSCULAR | Status: DC

## 2022-02-05 ENCOUNTER — Encounter: Payer: Self-pay | Admitting: Gastroenterology

## 2022-02-13 ENCOUNTER — Ambulatory Visit (HOSPITAL_COMMUNITY)
Admission: RE | Admit: 2022-02-13 | Discharge: 2022-02-13 | Disposition: A | Discharge status: Home / Self Care | Payer: BC Managed Care – PPO | Source: Ambulatory Visit | Attending: Nephrology | Admitting: Nephrology

## 2022-02-13 VITALS — BP 138/80 | HR 56 | Temp 98.4°F | Resp 17

## 2022-02-13 DIAGNOSIS — N183 Chronic kidney disease, stage 3 unspecified: Secondary | ICD-10-CM | POA: Insufficient documentation

## 2022-02-13 LAB — POCT HEMOGLOBIN-HEMACUE: Hemoglobin: 9.7 g/dL — ABNORMAL LOW (ref 13.0–17.0)

## 2022-02-13 MED ORDER — EPOETIN ALFA-EPBX 10000 UNIT/ML IJ SOLN
INTRAMUSCULAR | Status: AC
  Filled 2022-02-13: qty 2

## 2022-02-13 MED ORDER — EPOETIN ALFA-EPBX 10000 UNIT/ML IJ SOLN
20000.0000 [IU] | INTRAMUSCULAR | Status: DC
  Administered 2022-02-13: 20000 [IU] via SUBCUTANEOUS

## 2022-02-24 ENCOUNTER — Other Ambulatory Visit: Payer: Self-pay | Admitting: Cardiology

## 2022-02-27 ENCOUNTER — Ambulatory Visit (INDEPENDENT_AMBULATORY_CARE_PROVIDER_SITE_OTHER): Payer: BC Managed Care – PPO | Admitting: Cardiology

## 2022-02-27 ENCOUNTER — Encounter: Payer: Self-pay | Admitting: Cardiology

## 2022-02-27 ENCOUNTER — Ambulatory Visit
Admission: RE | Admit: 2022-02-27 | Discharge: 2022-02-27 | Disposition: A | Discharge status: Home / Self Care | Payer: BC Managed Care – PPO | Source: Ambulatory Visit | Attending: Nephrology | Admitting: Nephrology

## 2022-02-27 VITALS — BP 130/70 | HR 61 | Ht 72.0 in | Wt 234.0 lb

## 2022-02-27 VITALS — BP 146/82 | HR 70 | Temp 97.5°F | Resp 17

## 2022-02-27 DIAGNOSIS — Z9861 Coronary angioplasty status: Secondary | ICD-10-CM

## 2022-02-27 DIAGNOSIS — I2510 Atherosclerotic heart disease of native coronary artery without angina pectoris: Secondary | ICD-10-CM | POA: Insufficient documentation

## 2022-02-27 DIAGNOSIS — E785 Hyperlipidemia, unspecified: Secondary | ICD-10-CM | POA: Diagnosis not present

## 2022-02-27 DIAGNOSIS — R002 Palpitations: Secondary | ICD-10-CM

## 2022-02-27 DIAGNOSIS — I25119 Atherosclerotic heart disease of native coronary artery with unspecified angina pectoris: Secondary | ICD-10-CM | POA: Diagnosis not present

## 2022-02-27 DIAGNOSIS — N1832 Chronic kidney disease, stage 3b: Secondary | ICD-10-CM | POA: Diagnosis not present

## 2022-02-27 DIAGNOSIS — N183 Chronic kidney disease, stage 3 unspecified: Secondary | ICD-10-CM | POA: Insufficient documentation

## 2022-02-27 DIAGNOSIS — I10 Essential (primary) hypertension: Secondary | ICD-10-CM

## 2022-02-27 LAB — IRON AND TIBC
Iron: 107 ug/dL (ref 45–182)
Saturation Ratios: 42 % — ABNORMAL HIGH (ref 17.9–39.5)
TIBC: 253 ug/dL (ref 250–450)
UIBC: 146 ug/dL

## 2022-02-27 LAB — FERRITIN: Ferritin: 149 ng/mL (ref 24–336)

## 2022-02-27 MED ORDER — EPOETIN ALFA-EPBX 10000 UNIT/ML IJ SOLN
20000.0000 [IU] | INTRAMUSCULAR | Status: DC
  Administered 2022-02-27: 20000 [IU] via SUBCUTANEOUS

## 2022-02-27 MED ORDER — EPOETIN ALFA-EPBX 10000 UNIT/ML IJ SOLN
INTRAMUSCULAR | Status: AC
  Filled 2022-02-27: qty 2

## 2022-02-27 NOTE — Patient Instructions (Addendum)
Medication Instructions:  Stop taking Aspirin 81 mg ,continue with Clopidogrel  Until you on the transplant lis the  take Aspirin 81 mg only and stop Plavix (Clopidogrel) 75 mg   *If you need a refill on your cardiac medications before your next appointment, please call your pharmacy*   Lab Work: Lipids if  unable to labs at other doctors office.  If you have labs (blood work) drawn today and your tests are completely normal, you will receive your results only by: [place] (if you have MyChart) OR A paper copy in the mail If you have any lab test that is abnormal or we need to change your treatment, we will call you to review the results.   Testing/Procedures:  Not needed  Follow-Up: At [facility], you and your health needs are our priority.  As part of our continuing mission to provide you with exceptional heart care, we have created designated Provider Care Teams.  These Care Teams include your primary Cardiologist (physician) and Advanced Practice Providers (APPs -  Physician Assistants and Nurse Practitioners) who all work together to provide you with the care you need, when you need it.     Your next appointment:   12 month(s)  The format for your next appointment:   In Person  Provider:   Glenetta Hew, MD    Other Instructions

## 2022-02-27 NOTE — Progress Notes (Signed)
Primary Care Provider: Glenis Smoker, [place] Cardiologist: Glenetta Hew, MD Electrophysiologist: None Nephrologist: George Erickson  Clinic Note: Chief Complaint  Patient presents with   Follow-up    10 months.   Coronary Artery Disease    Not necessarily noticing any chest pain or pressure, just some mild fatigue.   ===================================  ASSESSMENT/PLAN   Problem List Items Addressed This Visit       Cardiology Problems   CAD S/P percutaneous coronary angioplasty (Chronic)    2 years out from most recent non-STEMI while visiting family in [place].  Had DES PCI of the LCx-OM 3 7% in-stent restenosis lesion and OM 3 with 80% stenosis.  He had mid RCA 60 to 65% treated medically.  As such.  Continue aggressive medical management.  Plan was long-term data Thienopyridine.  We converted Brilinta to Plavix because of cost and breathing issues.   Plan:  For now I would like to stop aspirin and continue Plavix alone until set time is to consider placing on the transplant list.  At that point he would go on aspirin alone so as to obviate the need to delay transplant surgery to hold Plavix.       Relevant Medications   hydrALAZINE (APRESOLINE) 100 MG tablet   Other Relevant Orders   EKG 12-Lead (Completed)   Lipid panel   Hyperlipidemia with target LDL less than 70 (Chronic)    Due for follow-up lipids to be checked.  His last LDL was 89 back in January.  He is on pravastatin I suspect that we may need to be more aggressive.  We will try to get labs checked either at his nephrologist or PCP office or here locally..  We need to be assessed lipid panel since his renal function has been followed closely by others.  I suspect that we may need to transition to a more potent statin if not at goal LDL less than 70.  This would be rosuvastatin 40 mg.      Relevant Medications   hydrALAZINE (APRESOLINE) 100 MG tablet   Other Relevant  Orders   Lipid panel   Lipid panel   Essential hypertension (Chronic)    His blood pressure was up and down.  Likely related to his renal disease.  He is on stable meds and being managed by nephrology.  I will defer to nephrology for managing basal infected he is pending transplant referral. We George Erickson have room if necessary to increase carvedilol although with a heart rate of 61, probably not. He is not on diuretic, but will defer to nephrology.  Thankfully, euvolemic.      Relevant Medications   hydrALAZINE (APRESOLINE) 100 MG tablet   Coronary artery disease involving native coronary artery of native heart with angina pectoris ([place]) - Primary (Chronic)    Erickson fairly well with no active angina symptoms.  On stable regimen.  DC aspirin, continue Plavix monotherapy Continue current doses of amlodipine 10 mg and carvedilol 6.25 mg twice daily along with hydralazine that is being used in place of ARB/ACE inhibitor due to renal insufficiency. He is on 40 mg pravastatin.  Need to have follow-up labs checked to ensure that his lipids are adequately controlled he otherwise may try to convert to rosuvastatin since this has not been listed as an allergy yet.  Pravastatin may simply just not be effective.      Relevant Medications   hydrALAZINE (APRESOLINE) 100 MG tablet  Other   CKD (chronic kidney disease), stage III (HCC) (Chronic)    Probably going from staged 3B to stage IV based on his discussion of nephrologist concerns.  They have not yet decided to place a fistula, but seems like they are heading that way.  Would recommend that he continues to follow-up with [place] but also with nephrology because they are also managing his blood pressures.      Palpitations - with exertion (Chronic)    Not really much of an issue on current dose of carvedilol.       ===================================  HPI:    George Erickson is a 51 y.o. male with a PMH -> notable for CAD-PCI, HTN, HLD and CKD  Stage IIIB below who presents today for delayed 60-monthfollow-up-more like 8 months..Marland Kitchen PMH notable for NSTEMI x2 (age 31-DES PCI D1, November 2021- DES PCI to LCx-OM3), with CRFs of HTN, HLD, CKD-3, mild obesity with OSA-CPAP   11/2009: ?STEMI - DES PCI D1.  (George Erickson) Patent stent on relook cath 11/2010 02/06/2020 - [facility] of LCx OM 3-DES PCI (discharged on aspirin Brilinta) D1-mid in-stent 70% restenosis; LCx-OM 3 proximal 80%, RCA mid 60 -65% => PCI OM 3-Synergy DES 2.5 mm x 20 mm (2.6 mm) --> Other notable conditions: OSA, CKD-3b/IV; HTN/HLD  MYAFET CLINEwas last seen in Mar 22, 2021 following his back surgery.  He noted that his back pain was notably improved just having some mild low back pain worse with activity but somewhat still limited.  Had not gone back to exercise but was gradually building up.  He noted that he was quite deconditioned having been debilitated for a little while with his back.  However with the amount of exertion that he is able to George Erickson, no chest pain or pressure, just some mild exertional dyspnea for overdoing it.  Getting better though with increased activity level.  BP was little high Bhagat, but is usually better than that at home.  More in the 120/80 range is seen the nephrologist office recently.  On recheck we also, blood pressure ~120/80 mmHg.  Consistently using CPAP without difficulties.  Otherwise no orthopnea, PND or edema. Stopped Imdur.  Converted from Brilinta to Plavix 75 mg daily.  Plan for maintenance or Plavix long-term.   Lipids and chemistry ordered.  He was most recently seen via telehealth conferencing on January 11, 2022 by George Erickson for preop assessment-pending colonoscopy => was still Erickson well.  Enjoying recreational activities such as hiking hunting and fishing.  Able to achieve more than 5 METS, but does note some mild exertional fatigue.  Okay procedure, hold Plavix for  5 days.  Low risk patient.  No further workup  Recent Hospitalizations: None  Reviewed  CV studies:    The following studies were reviewed today: (if available, images/films reviewed: From Epic Chart or Care Everywhere) No new studies:  Interval History:   George FLINCHUMreturns here today Erickson relatively well.  He has started getting worse renal function now CKD 4.  Discussed the possibility of heading toward dialysis although he is still making decent urine.  [facility]. Currently Erickson relatively well.  He is not really having any notable chest pain or pressure at rest or exertion, still has some fatigue, indicating that he is still somewhat deconditioned.  Although his back is better he still not exercising at  full capacity.  No PND orthopnea or edema.  No syncope or near syncope.  No TIA or amaurosis fugax.  No claudication.  Rare off-and-on palpitations but nothing prolonged.  He usually feels this when he is tired, or after exertion.  He has been having some intermittent issues with anemia, getting Procrit  He got his labs checked back in January, LDL was 89.  REVIEWED OF SYSTEMS   Review of Systems  Constitutional:  Positive for malaise/fatigue (Still deconditioned.  Getting better.). Negative for weight loss.  HENT:  Negative for congestion.   Respiratory:  Positive for shortness of breath (Associated with deconditioning.  Not with routine activity.). Negative for cough.   Cardiovascular:  Negative for leg swelling.  Gastrointestinal:  Negative for blood in stool and melena.  Genitourinary:  Negative for hematuria.  Musculoskeletal:  Positive for back pain (Getting better.  Has not has much radicular pain.) and joint pain (Hips mostly). Negative for falls.  Neurological:  Positive for weakness (Legs are getting stronger). Negative for dizziness and headaches.  Psychiatric/Behavioral:  Negative for depression  and suicidal ideas. The patient is not nervous/anxious and does not have insomnia.    I have reviewed and (if needed) personally updated the patient's problem list, medications, allergies, past medical and surgical history, social and family history.   PAST MEDICAL HISTORY   Past Medical History:  Diagnosis Date   Allergic rhinitis due to pollen    Arthritis    lower back   CAD S/P DES PCI D1 & OM3 11/15/2009   a) NSTEMI - 100% D1 (DES PCI - Promus DES 2.25 x 23 -- 2.3 mm); otherwise minimal CAD in a co-dominant system;; b) 02/06/20: OM3 80% (DES PCI), D1 70% ISR, mRCA 60-65%.    Chronic left shoulder pain    Impingement syndrome   Erectile dysfunction    Gout    Hyperlipidemia with target LDL less than 70    Associated CAD   Hypertension    Hypertensive kidney disease with CKD stage IV (HCC)    Cr as of August 2021 1.92   Non-ST elevation (NSTEMI) myocardial infarction ([place]) 11/2009; 01/2020   a) 9/'11: Cath - 100% D1 --> DES PCI Promus 2.25 x 23 (2.3 mm).  EF 55% with anterolateral HK. -> patent stent in Sept 2012 & 2015; b) 02/05/2020: NSTEMI - Echo EF >55% no RWMA - 80% OM3 (DES PCI), 70% D1 ISR, 60-65% mRCA.   Obesity (BMI 30.0-34.9)    OSA on CPAP    uses cpap   Vertigo     PAST SURGICAL HISTORY   Past Surgical History:  Procedure Laterality Date   CORONARY STENT INTERVENTION  02/06/2020   [place], [place] -> Dr. Gretta Erickson): NSTEMI: OM3 80% -> DES PCI: Synergy DES 2.5 x 20 - > 2.6 mm   CORONARY STENT PLACEMENT  11/15/2009   (Dr. Burt Knack) -- DES PCI of 100% D1 - Promus DES 2.25 x 23 --> 2.3 mm   HERNIA REPAIR     hernia repair at birth   LEFT HEART CATH AND CORONARY ANGIOGRAPHY  11/15/2009   (Dr. Burt Knack): NSTEMI -- LM-< LAD & LCx. ~mLAD ~30-40% just past D1 (D1 - 100% - CULPRIT - PCI), LCx normal - 2 OM & LPL. Dom RCA with Large PDA (small PL), ~30% mRCA.  EF ~55% with Ant-Lat HK    LEFT HEART CATH AND CORONARY ANGIOGRAPHY  11/22/2010   (Dr.  Wynonia Lawman): Patent Diag 1 stent with  minimal CAD elsewhere.  EF ~55-60% with Anterolateral HK   LEFT HEART CATH AND CORONARY ANGIOGRAPHY  12/31/2013   [facility] - for Abnormal Myoview) --> Non-obstructive CAD with widely patent D1 stent.  LVEDP ~16 mmHg   LEFT HEART CATH AND CORONARY ANGIOGRAPHY  02/06/2020   [place], [place] -> Dr. Elsie Erickson): NSTEMI: D1 70% mid stent ISR; OM3 80% (-> DES PCI), mRCA 60-65%.     LUMBAR LAMINECTOMY/ DECOMPRESSION WITH MET-RX N/A 01/07/2021   Procedure: Minimally Invasive Microdiscectomy right Lumbar four-Five, Left Lumbar five-Sacral one;  Surgeon: George Erickson, George Doing, George Erickson;  Location: [address];  Service: Neurosurgery;  Laterality: N/A;   NASAL SINUS SURGERY  2010   For sleep apnea   NM MYOVIEW LTD  12/05/2019   EF> 65%.  Fixed anterior perfusion defect with normal motion suggesting artifact.  Read as LOW RISK -> but NOT NORMAL   TM MYOVIEW  12/2013   (FALSE POSITIVE) + TM - lateral ST depressions (also noted with recovery - near syncope) -> Reversible Anterior Wall Perfusion defect - CRO Ischemia; Partialy reversible Inferior defect - ~ diaphragmatic attenuation. EF ~48%. --> Referred for CATH --> FALSE POSTIIVE   TRANSTHORACIC ECHOCARDIOGRAM  07/08/2014   EF 60%. No RWMA. Mild LA dilation. Normal walves.   TRANSTHORACIC ECHOCARDIOGRAM  02/05/2020   [facility] [place], [place] -> Dr. Elsie Erickson): NSTEMI: EF> 55%. No R WMA. Normal valves.    Immunization History  Administered Date(s) Administered   [employer] Vaccination 05/23/2019, 06/20/2019    MEDICATIONS/ALLERGIES   Current Meds  Medication Sig   amLODipine (NORVASC) 10 MG tablet Take 1 tablet by mouth daily.   aspirin 81 MG chewable tablet Chew by mouth.   calcitRIOL (ROCALTROL) 0.25 MCG capsule Take 0.25 mcg by mouth daily.   carvedilol (COREG) 6.25 MG tablet Take 1 tablet by mouth twice daily   clopidogrel (PLAVIX) 75 MG tablet TAKE 1 TABLET BY MOUTH ONCE  DAILY. START  AFTER  YOU  COMPLETE  BRILINTA   epoetin alfa (PROCRIT) 89211 UNIT/ML injection    hydrALAZINE (APRESOLINE) 100 MG tablet Take 100 mg by mouth 2 (two) times daily.   nitroGLYCERIN (NITROSTAT) 0.4 MG SL tablet Place 1 tablet (0.4 mg total) under the tongue every 5 (five) minutes as needed for chest pain.   nitroGLYCERIN (NITROSTAT) 0.4 MG SL tablet See admin instructions.   pravastatin (PRAVACHOL) 40 MG tablet TAKE 1 TABLET BY MOUTH ONCE DAILY IN THE EVENING   PROAIR RESPICLICK 941 (90 Base) MCG/ACT AEPB Inhale 2 puffs into the lungs every 4 (four) hours as needed (SOB/Wheezing).   sennosides-docusate sodium (SENOKOT-S) 8.6-50 MG tablet Take 1 tablet by mouth daily as needed for constipation.   sodium bicarbonate 650 MG tablet Take 650 mg by mouth 2 (two) times daily.   vitamin C (ASCORBIC ACID) 500 MG tablet Take 500 mg by mouth daily.    Allergies  Allergen Reactions   Niacin And Related Itching   Zocor [Simvastatin-High Dose]     Muscle aches    SOCIAL HISTORY/FAMILY HISTORY   Reviewed in Epic:  Pertinent findings:  Social History   Tobacco Use   Smoking status: Never   Smokeless tobacco: Never  Vaping Use   Vaping Use: Never used  Substance Use Topics   Alcohol use: Yes    Alcohol/week: 1.0 standard drink of alcohol    Types: 1 Standard drinks or equivalent per week    Comment: Rare, Occasional glass of wine  Drug use: No   Social History   Social History Narrative   He is a married father of 2 girls.   Public relations account executive   Lifelong non-smoker.   Rare glass of wine   Minimal caffeine intake, no longer drinks coffee only drinks intermittent sweet tea.  Rarely would have a Sprite.      Walks daily at work, does not get routine exercise.    OBJCTIVE -PE, EKG, labs   Wt Readings from Last 3 Encounters:  02/27/22 234 lb (106.1 kg)  01/26/22 238 lb (108 kg)  12/19/21 238 lb 2 oz (108 kg)    Physical Exam: BP 130/70 (BP  Location: Left Arm, Patient Position: Sitting, Cuff Size: Normal)   Pulse 61   Ht 6' (1.829 George)   Wt 234 lb (106.1 kg)   BMI 31.74 kg/George  Physical Exam Vitals reviewed.  Constitutional:      General: He is not in acute distress.    Appearance: Normal appearance. He is obese. He is not ill-appearing or toxic-appearing.  HENT:     Head: Normocephalic and atraumatic.  Neck:     Vascular: No carotid bruit or JVD.  Cardiovascular:     Rate and Rhythm: Normal rate and regular rhythm. Occasional Extrasystoles are present.    Chest Wall: PMI is not displaced.     Pulses: Normal pulses.     Heart sounds: S1 normal and S2 normal. Heart sounds are distant. No murmur heard.    No friction rub. No gallop.  Pulmonary:     Effort: Pulmonary effort is normal. No respiratory distress.     Breath sounds: Normal breath sounds. No wheezing, rhonchi or rales.  Musculoskeletal:        General: Swelling (Trivial ankle) present. Normal range of motion.     Cervical back: Normal range of motion and neck supple.  Skin:    General: Skin is warm and dry.  Neurological:     General: No focal deficit present.     Mental Status: He is alert and oriented to person, place, and time.     Gait: Gait normal.  Psychiatric:        Mood and Affect: Mood normal.        Behavior: Behavior normal.        Thought Content: Thought content normal.        Judgment: Judgment normal.     Adult ECG Report  Rate: 61 ;  Rhythm: normal sinus rhythm and inferior T wave inversions, consider inferior ischemia.  Otherwise normal axis, intervals and durations. ;   Narrative Interpretation: Stable  Recent Labs: Reviewed 03/30/2021: TC 133, TG 106, HDL 44, LDL 89.  A1c 4.6.   11/28/2021 Cr 3.41. 02/16/2020: TSH 1.72.  Lab Results  Component Value Date   CREATININE 2.74 (H) 12/29/2020   BUN 29 (H) 12/29/2020   NA 140 12/29/2020   K 3.7 12/29/2020   CL 110 12/29/2020   CO2 22 12/29/2020      Latest Ref Rng & Units  02/27/2022    8:06 AM 02/13/2022    8:07 AM 01/30/2022    8:12 AM  CBC  Hemoglobin 13.0 - 17.0 g/dL 9.8  9.7  9.6    ================================================== I spent a total of 26 minutes with the patient spent in direct patient consultation.  Additional time spent with chart review  / charting (studies, outside notes, etc): 47 min Total Time: 43 min  Current medicines are reviewed at  length with the patient today.  (+/- concerns) N/A  Notice: This dictation was prepared with Dragon dictation along with smart phrase technology. Any transcriptional errors that result from this process are unintentional and may not be corrected upon review.  Studies Ordered:   Orders Placed This Encounter  Procedures   Lipid panel   Lipid panel   EKG 12-Lead   No orders of the defined types were placed in this encounter.   Patient Instructions / Medication Changes & Studies & Tests Ordered   Patient Instructions  Medication Instructions:  Stop taking Aspirin 81 mg ,continue with Clopidogrel  Until you on the transplant lis the  take Aspirin 81 mg only and stop Plavix (Clopidogrel) 75 mg   *If you need a refill on your cardiac medications before your next appointment, please call your pharmacy*   Lab Work: Lipids if  unable to labs at other doctors office.  If you have labs (blood work) drawn today and your tests are completely normal, you will receive your results only by: [place] (if you have MyChart) OR A paper copy in the mail If you have any lab test that is abnormal or we need to change your treatment, we will call you to review the results.   Testing/Procedures:  Not needed  Follow-Up: At [facility], you and your health needs are our priority.  As part of our continuing mission to provide you with exceptional heart care, we have created designated Provider Care Teams.  These Care Teams include your primary Cardiologist (physician) and Advanced Practice  Providers (APPs -  Physician Assistants and Nurse Practitioners) who all work together to provide you with the care you need, when you need it.     Your next appointment:   12 month(s)  The format for your next appointment:   In Person  Provider:   Glenetta Hew, MD    Other Instructions      Leonie Man, MD, MS Glenetta Hew, George.D., George.S. Interventional Cardiologist  [place]  Pager # [phone] Phone # [phone] [address] [place], [place] [address]   Thank you for choosing [facility] [place]!!

## 2022-02-28 LAB — POCT HEMOGLOBIN-HEMACUE: Hemoglobin: 9.8 g/dL — ABNORMAL LOW (ref 13.0–17.0)

## 2022-03-04 ENCOUNTER — Encounter: Payer: Self-pay | Admitting: Cardiology

## 2022-03-04 NOTE — Assessment & Plan Note (Signed)
His blood pressure was up and down.  Likely related to his renal disease.  He is on stable meds and being managed by nephrology.  I will defer to nephrology for managing basal infected he is pending transplant referral. We do have room if necessary to increase carvedilol although with a heart rate of 61, probably not. He is not on diuretic, but will defer to nephrology.  Thankfully, euvolemic.

## 2022-03-04 NOTE — Assessment & Plan Note (Addendum)
2 years out from most recent non-STEMI while visiting family in [place].  Had DES PCI of the LCx-OM 3 7% in-stent restenosis lesion and OM 3 with 80% stenosis.  He had mid RCA 60 to 65% treated medically.  As such.  Continue aggressive medical management.  Plan was long-term data Thienopyridine.  We converted Brilinta to Plavix because of cost and breathing issues.   Plan:  For now I would like to stop aspirin and continue Plavix alone until set time is to consider placing on the transplant list.  At that point he would go on aspirin alone so as to obviate the need to delay transplant surgery to hold Plavix.

## 2022-03-04 NOTE — Assessment & Plan Note (Addendum)
Doing fairly well with no active angina symptoms.  On stable regimen.  DC aspirin, continue Plavix monotherapy Continue current doses of amlodipine 10 mg and carvedilol 6.25 mg twice daily along with hydralazine that is being used in place of ARB/ACE inhibitor due to renal insufficiency. He is on 40 mg pravastatin.  Need to have follow-up labs checked to ensure that his lipids are adequately controlled he otherwise may try to convert to rosuvastatin since this has not been listed as an allergy yet.  Pravastatin may simply just not be effective.

## 2022-03-04 NOTE — Assessment & Plan Note (Signed)
Probably going from staged 3B to stage IV based on his discussion of nephrologist concerns.  They have not yet decided to place a fistula, but seems like they are heading that way.  Would recommend that he continues to follow-up with [place] but also with nephrology because they are also managing his blood pressures.

## 2022-03-04 NOTE — Assessment & Plan Note (Signed)
Not really much of an issue on current dose of carvedilol.

## 2022-03-04 NOTE — Assessment & Plan Note (Signed)
Due for follow-up lipids to be checked.  His last LDL was 89 back in January.  He is on pravastatin I suspect that we may need to be more aggressive.  We will try to get labs checked either at his nephrologist or PCP office or here locally..  We need to be assessed lipid panel since his renal function has been followed closely by others.  I suspect that we may need to transition to a more potent statin if not at goal LDL less than 70.  This would be rosuvastatin 40 mg.

## 2022-03-14 ENCOUNTER — Encounter (HOSPITAL_COMMUNITY): Payer: BC Managed Care – PPO

## 2022-03-15 ENCOUNTER — Encounter (HOSPITAL_COMMUNITY)
Admission: RE | Admit: 2022-03-15 | Discharge: 2022-03-15 | Disposition: A | Discharge status: Home / Self Care | Payer: BC Managed Care – PPO | Source: Ambulatory Visit | Attending: Nephrology | Admitting: Nephrology

## 2022-03-15 ENCOUNTER — Encounter: Payer: Self-pay | Admitting: Cardiology

## 2022-03-15 VITALS — BP 133/77 | HR 65 | Temp 97.3°F | Resp 17

## 2022-03-15 DIAGNOSIS — Z79899 Other long term (current) drug therapy: Secondary | ICD-10-CM

## 2022-03-15 DIAGNOSIS — N183 Chronic kidney disease, stage 3 unspecified: Secondary | ICD-10-CM

## 2022-03-15 LAB — POCT HEMOGLOBIN-HEMACUE: Hemoglobin: 9.9 g/dL — ABNORMAL LOW (ref 13.0–17.0)

## 2022-03-15 MED ORDER — EPOETIN ALFA-EPBX 10000 UNIT/ML IJ SOLN
INTRAMUSCULAR | Status: AC
  Filled 2022-03-15: qty 2

## 2022-03-15 MED ORDER — EPOETIN ALFA-EPBX 10000 UNIT/ML IJ SOLN
20000.0000 [IU] | INTRAMUSCULAR | Status: DC
  Administered 2022-03-15: 20000 [IU] via SUBCUTANEOUS

## 2022-03-16 ENCOUNTER — Other Ambulatory Visit: Payer: Self-pay

## 2022-03-16 DIAGNOSIS — Z79899 Other long term (current) drug therapy: Secondary | ICD-10-CM

## 2022-03-16 NOTE — Telephone Encounter (Signed)
Lipid panel reviewed.  LDL is 70.  This pretty much right at the target zone of 70 or less.  For now continue current medications.  Continue to watch diet and increase exercise.   Should be rechecked in about 6 months.  Glenetta Hew, MD

## 2022-03-27 ENCOUNTER — Encounter (HOSPITAL_COMMUNITY)
Admission: RE | Admit: 2022-03-27 | Discharge: 2022-03-27 | Disposition: A | Discharge status: Home / Self Care | Payer: BC Managed Care – PPO | Source: Ambulatory Visit | Attending: Nephrology | Admitting: Nephrology

## 2022-03-27 VITALS — BP 127/87 | HR 72 | Temp 97.9°F | Resp 17

## 2022-03-27 DIAGNOSIS — N183 Chronic kidney disease, stage 3 unspecified: Secondary | ICD-10-CM

## 2022-03-27 LAB — CBC WITH DIFFERENTIAL/PLATELET
Abs Immature Granulocytes: 0.02 10*3/uL (ref 0.00–0.07)
Basophils Absolute: 0.1 10*3/uL (ref 0.0–0.1)
Basophils Relative: 1 %
Eosinophils Absolute: 0.1 10*3/uL (ref 0.0–0.5)
Eosinophils Relative: 2 %
HCT: 26.4 % — ABNORMAL LOW (ref 39.0–52.0)
Hemoglobin: 9.9 g/dL — ABNORMAL LOW (ref 13.0–17.0)
Immature Granulocytes: 0 %
Lymphocytes Relative: 10 %
Lymphs Abs: 0.8 10*3/uL (ref 0.7–4.0)
MCH: 31.2 pg (ref 26.0–34.0)
MCHC: 37.5 g/dL — ABNORMAL HIGH (ref 30.0–36.0)
MCV: 83.3 fL (ref 80.0–100.0)
Monocytes Absolute: 2.1 10*3/uL — ABNORMAL HIGH (ref 0.1–1.0)
Monocytes Relative: 23 %
Neutro Abs: 5.7 10*3/uL (ref 1.7–7.7)
Neutrophils Relative %: 64 %
Platelets: 267 10*3/uL (ref 150–400)
RBC: 3.17 MIL/uL — ABNORMAL LOW (ref 4.22–5.81)
RDW: 14.8 % (ref 11.5–15.5)
WBC: 8.9 10*3/uL (ref 4.0–10.5)
nRBC: 0.5 % — ABNORMAL HIGH (ref 0.0–0.2)

## 2022-03-27 LAB — RENAL FUNCTION PANEL
Albumin: 4 g/dL (ref 3.5–5.0)
Anion gap: 10 (ref 5–15)
BUN: 38 mg/dL — ABNORMAL HIGH (ref 6–20)
CO2: 23 mmol/L (ref 22–32)
Calcium: 8.8 mg/dL — ABNORMAL LOW (ref 8.9–10.3)
Chloride: 108 mmol/L (ref 98–111)
Creatinine, Ser: 3.47 mg/dL — ABNORMAL HIGH (ref 0.61–1.24)
GFR, Estimated: 20 mL/min — ABNORMAL LOW (ref >60)
Glucose, Bld: 116 mg/dL — ABNORMAL HIGH (ref 70–99)
Phosphorus: 3.4 mg/dL (ref 2.5–4.6)
Potassium: 3.6 mmol/L (ref 3.5–5.1)
Sodium: 141 mmol/L (ref 135–145)

## 2022-03-27 LAB — IRON AND TIBC
Iron: 36 ug/dL — ABNORMAL LOW (ref 45–182)
Saturation Ratios: 15 % — ABNORMAL LOW (ref 17.9–39.5)
TIBC: 245 ug/dL — ABNORMAL LOW (ref 250–450)
UIBC: 209 ug/dL

## 2022-03-27 LAB — VITAMIN D 25 HYDROXY (VIT D DEFICIENCY, FRACTURES): Vit D, 25-Hydroxy: 31.23 ng/mL (ref 30–100)

## 2022-03-27 LAB — POCT HEMOGLOBIN-HEMACUE: Hemoglobin: 9.9 g/dL — ABNORMAL LOW (ref 13.0–17.0)

## 2022-03-27 LAB — FERRITIN: Ferritin: 321 ng/mL (ref 24–336)

## 2022-03-27 MED ORDER — EPOETIN ALFA-EPBX 10000 UNIT/ML IJ SOLN
INTRAMUSCULAR | Status: AC
  Filled 2022-03-27: qty 2

## 2022-03-27 MED ORDER — EPOETIN ALFA-EPBX 10000 UNIT/ML IJ SOLN
20000.0000 [IU] | INTRAMUSCULAR | Status: DC
  Administered 2022-03-27: 20000 [IU] via SUBCUTANEOUS

## 2022-03-28 LAB — PTH, INTACT AND CALCIUM
Calcium, Total (PTH): 8.8 mg/dL (ref 8.7–10.2)
PTH: 102 pg/mL — ABNORMAL HIGH (ref 15–65)

## 2022-03-29 ENCOUNTER — Encounter (HOSPITAL_COMMUNITY): Payer: BC Managed Care – PPO

## 2022-04-07 ENCOUNTER — Other Ambulatory Visit (HOSPITAL_COMMUNITY): Payer: Self-pay | Admitting: *Deleted

## 2022-04-10 ENCOUNTER — Encounter (HOSPITAL_COMMUNITY)
Admission: RE | Admit: 2022-04-10 | Discharge: 2022-04-10 | Disposition: A | Discharge status: Home / Self Care | Payer: BC Managed Care – PPO | Source: Ambulatory Visit | Attending: Nephrology | Admitting: Nephrology

## 2022-04-10 VITALS — BP 136/78 | HR 72 | Temp 97.4°F | Resp 17

## 2022-04-10 DIAGNOSIS — N183 Chronic kidney disease, stage 3 unspecified: Secondary | ICD-10-CM

## 2022-04-10 MED ORDER — EPOETIN ALFA-EPBX 10000 UNIT/ML IJ SOLN
INTRAMUSCULAR | Status: AC
  Filled 2022-04-10: qty 2

## 2022-04-10 MED ORDER — EPOETIN ALFA-EPBX 10000 UNIT/ML IJ SOLN
20000.0000 [IU] | INTRAMUSCULAR | Status: DC
  Administered 2022-04-10: 20000 [IU] via SUBCUTANEOUS

## 2022-04-11 LAB — POCT HEMOGLOBIN-HEMACUE: Hemoglobin: 8.8 g/dL — ABNORMAL LOW (ref 13.0–17.0)

## 2022-04-24 ENCOUNTER — Encounter (HOSPITAL_COMMUNITY)
Admission: RE | Admit: 2022-04-24 | Discharge: 2022-04-24 | Disposition: A | Discharge status: Home / Self Care | Payer: BC Managed Care – PPO | Source: Ambulatory Visit | Attending: Nephrology | Admitting: Nephrology

## 2022-04-24 VITALS — BP 138/82 | HR 56 | Temp 97.8°F | Resp 17 | Wt 230.0 lb

## 2022-04-24 DIAGNOSIS — N183 Chronic kidney disease, stage 3 unspecified: Secondary | ICD-10-CM | POA: Insufficient documentation

## 2022-04-24 LAB — POCT HEMOGLOBIN-HEMACUE: Hemoglobin: 9.6 g/dL — ABNORMAL LOW (ref 13.0–17.0)

## 2022-04-24 MED ORDER — EPOETIN ALFA-EPBX 10000 UNIT/ML IJ SOLN
INTRAMUSCULAR | Status: AC
  Administered 2022-04-24: 20000 [IU] via SUBCUTANEOUS
  Filled 2022-04-24: qty 2

## 2022-04-24 MED ORDER — EPOETIN ALFA-EPBX 10000 UNIT/ML IJ SOLN: 20000.0000 [IU] | INTRAMUSCULAR | Status: DC

## 2022-04-24 MED ORDER — SODIUM CHLORIDE 0.9 % IV SOLN
510.0000 mg | Freq: Once | INTRAVENOUS | Status: AC
  Administered 2022-04-24: 510 mg via INTRAVENOUS
  Filled 2022-04-24: qty 510

## 2022-05-08 ENCOUNTER — Encounter (HOSPITAL_COMMUNITY)
Admission: RE | Admit: 2022-05-08 | Discharge: 2022-05-08 | Disposition: A | Discharge status: Home / Self Care | Payer: BC Managed Care – PPO | Source: Ambulatory Visit | Attending: Nephrology | Admitting: Nephrology

## 2022-05-08 VITALS — BP 145/83 | HR 57 | Temp 97.9°F | Resp 17

## 2022-05-08 DIAGNOSIS — N183 Chronic kidney disease, stage 3 unspecified: Secondary | ICD-10-CM

## 2022-05-08 LAB — IRON AND TIBC
Iron: 96 ug/dL (ref 45–182)
Saturation Ratios: 39 % (ref 17.9–39.5)
TIBC: 249 ug/dL — ABNORMAL LOW (ref 250–450)
UIBC: 153 ug/dL

## 2022-05-08 LAB — POCT HEMOGLOBIN-HEMACUE: Hemoglobin: 10 g/dL — ABNORMAL LOW (ref 13.0–17.0)

## 2022-05-08 LAB — FERRITIN: Ferritin: 227 ng/mL (ref 24–336)

## 2022-05-08 MED ORDER — EPOETIN ALFA-EPBX 10000 UNIT/ML IJ SOLN
INTRAMUSCULAR | Status: AC
  Filled 2022-05-08: qty 2

## 2022-05-08 MED ORDER — EPOETIN ALFA-EPBX 10000 UNIT/ML IJ SOLN
20000.0000 [IU] | INTRAMUSCULAR | Status: DC
  Administered 2022-05-08: 20000 [IU] via SUBCUTANEOUS

## 2022-05-22 ENCOUNTER — Encounter (HOSPITAL_COMMUNITY)
Admission: RE | Admit: 2022-05-22 | Discharge: 2022-05-22 | Disposition: A | Discharge status: Home / Self Care | Payer: BC Managed Care – PPO | Source: Ambulatory Visit | Attending: Nephrology | Admitting: Nephrology

## 2022-05-22 VITALS — BP 152/82 | HR 73 | Temp 97.3°F | Resp 17

## 2022-05-22 DIAGNOSIS — N183 Chronic kidney disease, stage 3 unspecified: Secondary | ICD-10-CM | POA: Insufficient documentation

## 2022-05-22 LAB — POCT HEMOGLOBIN-HEMACUE: Hemoglobin: 10.5 g/dL — ABNORMAL LOW (ref 13.0–17.0)

## 2022-05-22 MED ORDER — EPOETIN ALFA-EPBX 10000 UNIT/ML IJ SOLN
INTRAMUSCULAR | Status: AC
  Filled 2022-05-22: qty 2

## 2022-05-22 MED ORDER — EPOETIN ALFA-EPBX 10000 UNIT/ML IJ SOLN
20000.0000 [IU] | INTRAMUSCULAR | Status: DC
  Administered 2022-05-22: 20000 [IU] via SUBCUTANEOUS

## 2022-05-26 ENCOUNTER — Other Ambulatory Visit: Payer: Self-pay | Admitting: Cardiology

## 2022-06-05 ENCOUNTER — Encounter (HOSPITAL_COMMUNITY)
Admission: RE | Admit: 2022-06-05 | Discharge: 2022-06-05 | Disposition: A | Discharge status: Home / Self Care | Payer: BC Managed Care – PPO | Source: Ambulatory Visit | Attending: Nephrology | Admitting: Nephrology

## 2022-06-05 VITALS — BP 137/80 | HR 64 | Temp 97.3°F | Resp 17

## 2022-06-05 DIAGNOSIS — N183 Chronic kidney disease, stage 3 unspecified: Secondary | ICD-10-CM

## 2022-06-05 LAB — IRON AND TIBC
Iron: 133 ug/dL (ref 45–182)
Saturation Ratios: 63 % — ABNORMAL HIGH (ref 17.9–39.5)
TIBC: 213 ug/dL — ABNORMAL LOW (ref 250–450)
UIBC: 80 ug/dL

## 2022-06-05 LAB — POCT HEMOGLOBIN-HEMACUE: Hemoglobin: 9.7 g/dL — ABNORMAL LOW (ref 13.0–17.0)

## 2022-06-05 LAB — FERRITIN: Ferritin: 359 ng/mL — ABNORMAL HIGH (ref 24–336)

## 2022-06-05 MED ORDER — EPOETIN ALFA-EPBX 10000 UNIT/ML IJ SOLN: 20000.0000 [IU] | INTRAMUSCULAR | Status: DC

## 2022-06-05 MED ORDER — EPOETIN ALFA-EPBX 40000 UNIT/ML IJ SOLN
INTRAMUSCULAR | Status: AC
  Administered 2022-06-05: 20000 [IU] via SUBCUTANEOUS
  Filled 2022-06-05: qty 1

## 2022-06-19 ENCOUNTER — Encounter (HOSPITAL_COMMUNITY)
Admission: RE | Admit: 2022-06-19 | Discharge: 2022-06-19 | Disposition: A | Discharge status: Home / Self Care | Payer: BC Managed Care – PPO | Source: Ambulatory Visit | Attending: Nephrology | Admitting: Nephrology

## 2022-06-19 VITALS — BP 137/89 | HR 65 | Temp 97.4°F | Resp 17

## 2022-06-19 DIAGNOSIS — N183 Chronic kidney disease, stage 3 unspecified: Secondary | ICD-10-CM | POA: Insufficient documentation

## 2022-06-19 MED ORDER — EPOETIN ALFA-EPBX 10000 UNIT/ML IJ SOLN
20000.0000 [IU] | INTRAMUSCULAR | Status: DC
  Administered 2022-06-19: 20000 [IU] via SUBCUTANEOUS

## 2022-06-19 MED ORDER — EPOETIN ALFA-EPBX 10000 UNIT/ML IJ SOLN
INTRAMUSCULAR | Status: AC
  Filled 2022-06-19: qty 2

## 2022-06-20 LAB — POCT HEMOGLOBIN-HEMACUE: Hemoglobin: 9.9 g/dL — ABNORMAL LOW (ref 13.0–17.0)

## 2022-06-25 ENCOUNTER — Other Ambulatory Visit: Payer: Self-pay | Admitting: Cardiology

## 2022-07-03 ENCOUNTER — Encounter (HOSPITAL_COMMUNITY)
Admission: RE | Admit: 2022-07-03 | Discharge: 2022-07-03 | Disposition: A | Discharge status: Home / Self Care | Payer: BC Managed Care – PPO | Source: Ambulatory Visit | Attending: Nephrology | Admitting: Nephrology

## 2022-07-03 VITALS — BP 142/84 | HR 64 | Temp 97.3°F | Resp 17

## 2022-07-03 DIAGNOSIS — N183 Chronic kidney disease, stage 3 unspecified: Secondary | ICD-10-CM

## 2022-07-03 LAB — IRON AND TIBC
Iron: 111 ug/dL (ref 45–182)
Saturation Ratios: 42 % — ABNORMAL HIGH (ref 17.9–39.5)
TIBC: 262 ug/dL (ref 250–450)
UIBC: 151 ug/dL

## 2022-07-03 LAB — POCT HEMOGLOBIN-HEMACUE: Hemoglobin: 10.2 g/dL — ABNORMAL LOW (ref 13.0–17.0)

## 2022-07-03 LAB — FERRITIN: Ferritin: 306 ng/mL (ref 24–336)

## 2022-07-03 MED ORDER — EPOETIN ALFA-EPBX 10000 UNIT/ML IJ SOLN
INTRAMUSCULAR | Status: AC
  Administered 2022-07-03: 20000 [IU] via SUBCUTANEOUS
  Filled 2022-07-03: qty 2

## 2022-07-03 MED ORDER — EPOETIN ALFA-EPBX 10000 UNIT/ML IJ SOLN: 20000.0000 [IU] | INTRAMUSCULAR | Status: DC

## 2022-07-17 ENCOUNTER — Encounter (HOSPITAL_COMMUNITY)
Admission: RE | Admit: 2022-07-17 | Discharge: 2022-07-17 | Disposition: A | Discharge status: Home / Self Care | Payer: BC Managed Care – PPO | Source: Ambulatory Visit | Attending: Nephrology | Admitting: Nephrology

## 2022-07-17 VITALS — BP 156/85 | HR 61 | Temp 97.4°F | Resp 17

## 2022-07-17 DIAGNOSIS — N183 Chronic kidney disease, stage 3 unspecified: Secondary | ICD-10-CM | POA: Insufficient documentation

## 2022-07-17 MED ORDER — EPOETIN ALFA-EPBX 10000 UNIT/ML IJ SOLN
20000.0000 [IU] | INTRAMUSCULAR | Status: DC
  Administered 2022-07-17: 20000 [IU] via SUBCUTANEOUS

## 2022-07-17 MED ORDER — EPOETIN ALFA-EPBX 10000 UNIT/ML IJ SOLN
INTRAMUSCULAR | Status: AC
  Filled 2022-07-17: qty 2

## 2022-07-18 LAB — POCT HEMOGLOBIN-HEMACUE: Hemoglobin: 9.9 g/dL — ABNORMAL LOW (ref 13.0–17.0)

## 2022-07-31 ENCOUNTER — Encounter (HOSPITAL_COMMUNITY)
Admission: RE | Admit: 2022-07-31 | Discharge: 2022-07-31 | Disposition: A | Discharge status: Home / Self Care | Payer: BC Managed Care – PPO | Source: Ambulatory Visit | Attending: Nephrology | Admitting: Nephrology

## 2022-07-31 VITALS — BP 148/93 | HR 64 | Temp 97.3°F | Resp 17

## 2022-07-31 DIAGNOSIS — N183 Chronic kidney disease, stage 3 unspecified: Secondary | ICD-10-CM | POA: Diagnosis not present

## 2022-07-31 LAB — IRON AND TIBC
Iron: 124 ug/dL (ref 45–182)
Saturation Ratios: 46 % — ABNORMAL HIGH (ref 17.9–39.5)
TIBC: 267 ug/dL (ref 250–450)
UIBC: 143 ug/dL

## 2022-07-31 LAB — POCT HEMOGLOBIN-HEMACUE: Hemoglobin: 9.8 g/dL — ABNORMAL LOW (ref 13.0–17.0)

## 2022-07-31 LAB — FERRITIN: Ferritin: 295 ng/mL (ref 24–336)

## 2022-07-31 MED ORDER — EPOETIN ALFA-EPBX 10000 UNIT/ML IJ SOLN
INTRAMUSCULAR | Status: AC
  Administered 2022-07-31: 20000 [IU] via SUBCUTANEOUS
  Filled 2022-07-31: qty 2

## 2022-07-31 MED ORDER — EPOETIN ALFA-EPBX 10000 UNIT/ML IJ SOLN: 20000.0000 [IU] | INTRAMUSCULAR | Status: DC

## 2022-08-14 ENCOUNTER — Encounter (HOSPITAL_COMMUNITY)
Admission: RE | Admit: 2022-08-14 | Discharge: 2022-08-14 | Disposition: A | Discharge status: Home / Self Care | Payer: BC Managed Care – PPO | Source: Ambulatory Visit | Attending: Nephrology | Admitting: Nephrology

## 2022-08-14 VITALS — BP 148/94 | HR 56 | Temp 97.4°F | Resp 17

## 2022-08-14 DIAGNOSIS — N183 Chronic kidney disease, stage 3 unspecified: Secondary | ICD-10-CM | POA: Diagnosis present

## 2022-08-14 LAB — POCT HEMOGLOBIN-HEMACUE: Hemoglobin: 9.6 g/dL — ABNORMAL LOW (ref 13.0–17.0)

## 2022-08-14 MED ORDER — EPOETIN ALFA-EPBX 10000 UNIT/ML IJ SOLN: 20000.0000 [IU] | INTRAMUSCULAR | Status: DC

## 2022-08-14 MED ORDER — EPOETIN ALFA-EPBX 10000 UNIT/ML IJ SOLN
INTRAMUSCULAR | Status: AC
  Administered 2022-08-14: 20000 [IU] via SUBCUTANEOUS
  Filled 2022-08-14: qty 2

## 2022-08-28 ENCOUNTER — Encounter (HOSPITAL_COMMUNITY)
Admission: RE | Admit: 2022-08-28 | Discharge: 2022-08-28 | Disposition: A | Discharge status: Home / Self Care | Payer: BC Managed Care – PPO | Source: Ambulatory Visit | Attending: Nephrology | Admitting: Nephrology

## 2022-08-28 VITALS — BP 133/88 | HR 62 | Temp 97.1°F | Resp 17

## 2022-08-28 DIAGNOSIS — N183 Chronic kidney disease, stage 3 unspecified: Secondary | ICD-10-CM

## 2022-08-28 LAB — FERRITIN: Ferritin: 269 ng/mL (ref 24–336)

## 2022-08-28 LAB — IRON AND TIBC
Iron: 117 ug/dL (ref 45–182)
Saturation Ratios: 45 % — ABNORMAL HIGH (ref 17.9–39.5)
TIBC: 263 ug/dL (ref 250–450)
UIBC: 146 ug/dL

## 2022-08-28 LAB — POCT HEMOGLOBIN-HEMACUE: Hemoglobin: 10 g/dL — ABNORMAL LOW (ref 13.0–17.0)

## 2022-08-28 MED ORDER — EPOETIN ALFA-EPBX 10000 UNIT/ML IJ SOLN
INTRAMUSCULAR | Status: AC
  Administered 2022-08-28: 20000 [IU] via SUBCUTANEOUS
  Filled 2022-08-28: qty 2

## 2022-08-28 MED ORDER — EPOETIN ALFA-EPBX 10000 UNIT/ML IJ SOLN: 20000.0000 [IU] | INTRAMUSCULAR | Status: DC

## 2022-09-11 ENCOUNTER — Encounter (HOSPITAL_COMMUNITY)
Admission: RE | Admit: 2022-09-11 | Discharge: 2022-09-11 | Disposition: A | Discharge status: Home / Self Care | Payer: BC Managed Care – PPO | Source: Ambulatory Visit | Attending: Nephrology | Admitting: Nephrology

## 2022-09-11 VITALS — BP 136/91 | HR 56 | Temp 97.3°F | Resp 17

## 2022-09-11 DIAGNOSIS — N183 Chronic kidney disease, stage 3 unspecified: Secondary | ICD-10-CM | POA: Diagnosis present

## 2022-09-11 LAB — POCT HEMOGLOBIN-HEMACUE: Hemoglobin: 9.3 g/dL — ABNORMAL LOW (ref 13.0–17.0)

## 2022-09-11 MED ORDER — EPOETIN ALFA-EPBX 10000 UNIT/ML IJ SOLN
20000.0000 [IU] | INTRAMUSCULAR | Status: DC
  Administered 2022-09-11: 20000 [IU] via SUBCUTANEOUS

## 2022-09-11 MED ORDER — EPOETIN ALFA-EPBX 10000 UNIT/ML IJ SOLN
INTRAMUSCULAR | Status: AC
  Filled 2022-09-11: qty 2

## 2022-09-16 LAB — LIPID PANEL
Chol/HDL Ratio: 3 ratio (ref 0.0–5.0)
Cholesterol, Total: 121 mg/dL (ref 100–199)
HDL: 41 mg/dL (ref >39)
LDL Chol Calc (NIH): 62 mg/dL (ref 0–99)
Triglycerides: 96 mg/dL (ref 0–149)
VLDL Cholesterol Cal: 18 mg/dL (ref 5–40)

## 2022-09-25 ENCOUNTER — Encounter (HOSPITAL_COMMUNITY)
Admission: RE | Admit: 2022-09-25 | Discharge: 2022-09-25 | Disposition: A | Discharge status: Home / Self Care | Payer: BC Managed Care – PPO | Source: Ambulatory Visit | Attending: Nephrology | Admitting: Nephrology

## 2022-09-25 VITALS — BP 132/88 | HR 62 | Temp 97.5°F | Resp 17

## 2022-09-25 DIAGNOSIS — N183 Chronic kidney disease, stage 3 unspecified: Secondary | ICD-10-CM | POA: Diagnosis not present

## 2022-09-25 LAB — IRON AND TIBC
Iron: 104 ug/dL (ref 45–182)
Saturation Ratios: 40 % — ABNORMAL HIGH (ref 17.9–39.5)
TIBC: 262 ug/dL (ref 250–450)
UIBC: 158 ug/dL

## 2022-09-25 LAB — FERRITIN: Ferritin: 347 ng/mL — ABNORMAL HIGH (ref 24–336)

## 2022-09-25 LAB — POCT HEMOGLOBIN-HEMACUE: Hemoglobin: 10.3 g/dL — ABNORMAL LOW (ref 13.0–17.0)

## 2022-09-25 MED ORDER — EPOETIN ALFA-EPBX 10000 UNIT/ML IJ SOLN
20000.0000 [IU] | INTRAMUSCULAR | Status: DC
  Administered 2022-09-25: 20000 [IU] via SUBCUTANEOUS

## 2022-09-25 MED ORDER — EPOETIN ALFA-EPBX 10000 UNIT/ML IJ SOLN
INTRAMUSCULAR | Status: AC
  Filled 2022-09-25: qty 2

## 2022-10-02 ENCOUNTER — Encounter: Payer: Self-pay | Admitting: Cardiology

## 2022-10-03 ENCOUNTER — Telehealth: Payer: Self-pay | Admitting: *Deleted

## 2022-10-03 NOTE — Telephone Encounter (Signed)
Pt has been scheduled for tele pre op appt 10/04/22 add on due to procedure date and med hold. Pt was advised he will need to begin to hold his Plavix as of tomorrow 10/04/22, which the pre op APP will go over this with as well during the tele pre op appt.    Med rec and consent are done.

## 2022-10-03 NOTE — Telephone Encounter (Signed)
   Name: George Erickson  DOB: February 18, 1971  MRN: [id number]  Primary Cardiologist: Bryan Lemma, MD   Preoperative team, please contact this patient and set up a phone call appointment for further preoperative risk assessment. Please obtain consent and complete medication review. Thank you for your help.  I confirm that guidance regarding antiplatelet and oral anticoagulation therapy has been completed and, if necessary, noted below.  Per office protocol, if patient is without any new symptoms or concerns at the time of their virtual visit, he may hold Plavix for 5 days prior to procedure. Please resume Plavix as soon as possible postprocedure, at the discretion of the surgeon.     Joylene Grapes, NP 10/03/2022, 12:48 PM [facility]

## 2022-10-03 NOTE — Telephone Encounter (Signed)
Pt has been scheduled for tele pre op appt 10/04/22 add on due to procedure date and med hold. Pt was advised he will need to begin to hold his Plavix as of tomorrow 10/04/22, which the pre op APP will go over this with as well during the tele pre op appt.   Med rec and consent are done.     Patient Consent for Virtual Visit        George Erickson has provided verbal consent on 10/03/2022 for a virtual visit (video or telephone).   CONSENT FOR VIRTUAL VISIT FOR:  George Erickson  By participating in this virtual visit I agree to the following:  I hereby voluntarily request, consent and authorize [facility] and its employed or contracted physicians, physician assistants, nurse practitioners or other licensed health care professionals (the Practitioner), to provide me with telemedicine health care services (the "Services") as deemed necessary by the treating Practitioner. I acknowledge and consent to receive the Services by the Practitioner via telemedicine. I understand that the telemedicine visit will involve communicating with the Practitioner through live audiovisual communication technology and the disclosure of certain medical information by electronic transmission. I acknowledge that I have been given the opportunity to request an in-person assessment or other available alternative prior to the telemedicine visit and am voluntarily participating in the telemedicine visit.  I understand that I have the right to withhold or withdraw my consent to the use of telemedicine in the course of my care at any time, without affecting my right to future care or treatment, and that the Practitioner or I may terminate the telemedicine visit at any time. I understand that I have the right to inspect all information obtained and/or recorded in the course of the telemedicine visit and may receive copies of available information for a reasonable fee.  I understand that some of the potential risks of  receiving the Services via telemedicine include:  Delay or interruption in medical evaluation due to technological equipment failure or disruption; Information transmitted may not be sufficient (e.g. poor resolution of images) to allow for appropriate medical decision making by the Practitioner; and/or  In rare instances, security protocols could fail, causing a breach of personal health information.  Furthermore, I acknowledge that it is my responsibility to provide information about my medical history, conditions and care that is complete and accurate to the best of my ability. I acknowledge that Practitioner's advice, recommendations, and/or decision may be based on factors not within their control, such as incomplete or inaccurate data provided by me or distortions of diagnostic images or specimens that may result from electronic transmissions. I understand that the practice of medicine is not an exact science and that Practitioner makes no warranties or guarantees regarding treatment outcomes. I acknowledge that a copy of this consent can be made available to me via my patient portal [facility] MyChart), or I can request a printed copy by calling the office of [facility].    I understand that my insurance will be billed for this visit.   I have read or had this consent read to me. I understand the contents of this consent, which adequately explains the benefits and risks of the Services being provided via telemedicine.  I have been provided ample opportunity to ask questions regarding this consent and the Services and have had my questions answered to my satisfaction. I give my informed consent for the services to be provided through the use of telemedicine  in my medical care

## 2022-10-03 NOTE — Telephone Encounter (Signed)
   Pre-operative Risk Assessment    Patient Name: George Erickson  DOB: 1970/07/15 MRN: [id number]      Request for Surgical Clearance    Procedure:   Vitrectomy  Date of Surgery:  Clearance 10/09/22                                 Surgeon:  Dr. Stephannie Li Surgeon's Group or Practice Name:  [facility] Phone number:  [phone] Fax number:  [phone]   Type of Clearance Requested:   - Medical  - Pharmacy:  Hold Clopidogrel (Plavix) 5 days prior.   Type of Anesthesia:  Not Indicated   Additional requests/questions: S/w [place] to clarify surgery.   Signed, Emmit Pomfret   10/03/2022, 12:25 PM

## 2022-10-04 ENCOUNTER — Ambulatory Visit: Payer: BC Managed Care – PPO | Attending: Internal Medicine | Admitting: Adult Health

## 2022-10-04 DIAGNOSIS — Z01818 Encounter for other preprocedural examination: Secondary | ICD-10-CM

## 2022-10-04 NOTE — Progress Notes (Signed)
Virtual Visit via Telephone Note   Because of George Erickson co-morbid illnesses, he is at least at moderate risk for complications without adequate follow up.  This format is felt to be most appropriate for this patient at this time.  The patient did not have access to video technology/had technical difficulties with video requiring transitioning to audio format only (telephone).  All issues noted in this document were discussed and addressed.  No physical exam could be performed with this format.  Please refer to the patient's chart for his consent to telehealth for [facility].  Evaluation Performed:  Preoperative cardiovascular risk assessment _____________   Date:  10/04/2022   Patient ID:  George Erickson, DOB 21-Feb-1971, MRN [id number] Patient Location:  Home Provider location:   Office  Primary Care Provider:  Shon Hale, MD Primary Cardiologist:  Bryan Lemma, MD  Chief Complaint / Patient Profile   52 y.o. y/o male with a h/o CAD, non-STEMI 2021, status post PCI of the left circumflex to OM with in-stent restenosis OM 3 with a percent stenosis, with recommendations to continue Plavix alone just that time to consider placing on transplant list;  who is pending vitrectomy by [facility] Retina specialist Dr. Stephannie Li on 10/09/2022 and  and presents today for telephonic preoperative cardiovascular risk assessment and recommendations concerning holding clopidogrel for 5 days.   History of Present Illness    George Erickson is a 52 y.o. male who presents via audio/video conferencing for a telehealth visit today.  Pt was last seen in cardiology clinic on 02/27/2022 by Dr. Herbie Baltimore.  At that time George Erickson was doing well .  The patient is now pending procedure as outlined above. Since his last visit, he doing well since being seen.,  Past Medical History    Past Medical History:  Diagnosis Date   Allergic rhinitis due to pollen    Arthritis     lower back   CAD S/P DES PCI D1 & OM3 11/15/2009   a) NSTEMI - 100% D1 (DES PCI - Promus DES 2.25 x 23 -- 2.3 mm); otherwise minimal CAD in a co-dominant system;; b) 02/06/20: OM3 80% (DES PCI), D1 70% ISR, mRCA 60-65%.    Chronic left shoulder pain    Impingement syndrome   Erectile dysfunction    Gout    Hyperlipidemia with target LDL less than 70    Associated CAD   Hypertension    Hypertensive kidney disease with CKD stage IV (HCC)    Cr as of August 2021 1.92   Non-ST elevation (NSTEMI) myocardial infarction (HCC) 11/2009; 01/2020   a) 9/'11: Cath - 100% D1 --> DES PCI Promus 2.25 x 23 (2.3 mm).  EF 55% with anterolateral HK. -> patent stent in Sept 2012 & 2015; b) 02/05/2020: NSTEMI - Echo EF >55% no RWMA - 80% OM3 (DES PCI), 70% D1 ISR, 60-65% mRCA.   Obesity (BMI 30.0-34.9)    OSA on CPAP    uses cpap   Vertigo    Past Surgical History:  Procedure Laterality Date   CORONARY STENT INTERVENTION  02/06/2020   [facility] [place] [facility] [place], [place] -> Dr. Jeanne Ivan): NSTEMI: OM3 80% -> DES PCI: Synergy DES 2.5 x 20 - > 2.6 mm   CORONARY STENT PLACEMENT  11/15/2009   (Dr. Excell Seltzer) -- DES PCI of 100% D1 - Promus DES 2.25 x 23 --> 2.3 mm   HERNIA REPAIR     hernia repair at  birth   LEFT HEART CATH AND CORONARY ANGIOGRAPHY  11/15/2009   (Dr. Excell Seltzer): NSTEMI -- LM-< LAD & LCx. ~mLAD ~30-40% just past D1 (D1 - 100% - CULPRIT - PCI), LCx normal - 2 OM & LPL. Dom RCA with Large PDA (small PL), ~30% mRCA.  EF ~55% with Ant-Lat HK    LEFT HEART CATH AND CORONARY ANGIOGRAPHY  11/22/2010   (Dr. Donnie Aho): Patent Diag 1 stent with minimal CAD elsewhere.  EF ~55-60% with Anterolateral HK   LEFT HEART CATH AND CORONARY ANGIOGRAPHY  12/31/2013   [facility] - for Abnormal Myoview) --> Non-obstructive CAD with widely patent D1 stent.  LVEDP ~16 mmHg   LEFT HEART CATH AND CORONARY ANGIOGRAPHY  02/06/2020   [facility] [place] [facility] [place], [place] -> Dr. Mellody Drown): NSTEMI: D1 70% mid stent ISR;  OM3 80% (-> DES PCI), mRCA 60-65%.     LUMBAR LAMINECTOMY/ DECOMPRESSION WITH MET-RX N/A 01/07/2021   Procedure: Minimally Invasive Microdiscectomy right Lumbar four-Five, Left Lumbar five-Sacral one;  Surgeon: Dawley, Alan Mulder, DO;  Location: [facility] OR;  Service: Neurosurgery;  Laterality: N/A;   NASAL SINUS SURGERY  2010   For sleep apnea   NM MYOVIEW LTD  12/05/2019   EF> 65%.  Fixed anterior perfusion defect with normal motion suggesting artifact.  Read as LOW RISK -> but NOT NORMAL   TM MYOVIEW  12/2013   (FALSE POSITIVE) + TM - lateral ST depressions (also noted with recovery - near syncope) -> Reversible Anterior Wall Perfusion defect - CRO Ischemia; Partialy reversible Inferior defect - ~ diaphragmatic attenuation. EF ~48%. --> Referred for CATH --> FALSE POSTIIVE   TRANSTHORACIC ECHOCARDIOGRAM  07/08/2014   EF 60%. No RWMA. Mild LA dilation. Normal walves.   TRANSTHORACIC ECHOCARDIOGRAM  02/05/2020   [facility] [place], [place] -> Dr. Mellody Drown): NSTEMI: EF> 55%. No R WMA. Normal valves.    Allergies  Allergies  Allergen Reactions   Niacin And Related Itching   Zocor [Simvastatin-High Dose]     Muscle aches    Home Medications    Prior to Admission medications   Medication Sig Start Date End Date Taking? Authorizing Provider  amLODipine (NORVASC) 10 MG tablet Take 1 tablet by mouth daily.    [provider]  calcitRIOL (ROCALTROL) 0.25 MCG capsule Take 0.25 mcg by mouth daily. 11/28/21   [provider]  carvedilol (COREG) 6.25 MG tablet Take 1 tablet by mouth twice daily 06/27/22   Marykay Lex, MD  clopidogrel (PLAVIX) 75 MG tablet TAKE 1 TABLET BY MOUTH ONCE DAILY START  AFTER  YOU  COMPLETE  BRILINTA 05/26/22   Marykay Lex, MD  epoetin alfa (PROCRIT) 16109 UNIT/ML injection  10/11/21   [provider]  hydrALAZINE (APRESOLINE) 100 MG tablet Take 100 mg by mouth 2 (two) times daily.    [provider]  nitroGLYCERIN  (NITROSTAT) 0.4 MG SL tablet Place 1 tablet (0.4 mg total) under the tongue every 5 (five) minutes as needed for chest pain. 01/23/20   Marykay Lex, MD  nitroGLYCERIN (NITROSTAT) 0.4 MG SL tablet See admin instructions. 08/19/20   [provider]  pravastatin (PRAVACHOL) 40 MG tablet TAKE 1 TABLET BY MOUTH ONCE DAILY IN THE EVENING 10/31/21   Marykay Lex, MD  PROAIR RESPICLICK [phone] Base) MCG/ACT AEPB Inhale 2 puffs into the lungs every 4 (four) hours as needed (SOB/Wheezing). 01/08/20   [provider]  sennosides-docusate sodium (SENOKOT-S) 8.6-50 MG tablet Take 1  tablet by mouth daily as needed for constipation.    [provider]  sodium bicarbonate 650 MG tablet Take 1,300 mg by mouth 2 (two) times daily. 11/23/21   [provider]  vitamin C (ASCORBIC ACID) 500 MG tablet Take 500 mg by mouth daily.    [provider]    Physical Exam    Vital Signs:  George Erickson does not have vital signs available for review today.  Given telephonic nature of communication, physical exam is limited. AAOx3. NAD. Normal affect.  Speech and respirations are unlabored.  Accessory Clinical Findings    None  Assessment & Plan    1.  Preoperative Cardiovascular Risk Assessment:  The patient was advised that if he develops new symptoms prior to surgery to contact our office to arrange for a follow-up visit, and he verbalized understanding.  According to the Revised Cardiac Risk Index (RCRI), his Perioperative Risk of Major Cardiac Event is (%): 0.9-Moderate Risk  His Functional Capacity in METs is: 9.89 according to the Duke Activity Status Index (DASI).   Therefore, based on ACC/AHA guidelines, patient would be at acceptable risk for the planned procedure without further cardiovascular testing.   Per office protocol, if patient is without any new symptoms or concerns at the time of their virtual visit, he/she may hold Plavix for 5 days prior to  procedure. Please resume Plavix  as soon as possible postprocedure, at the discretion of the surgeon.    A copy of this note will be routed to requesting surgeon.   Time:   Today, I have spent 10 minutes with the patient with telehealth technology discussing medical history, symptoms, and management plan.     Joni Reining, NP  10/04/2022, 10:52 AM

## 2022-10-09 ENCOUNTER — Encounter (HOSPITAL_COMMUNITY): Payer: BC Managed Care – PPO

## 2022-10-10 ENCOUNTER — Encounter (HOSPITAL_COMMUNITY)
Admission: RE | Admit: 2022-10-10 | Discharge: 2022-10-10 | Disposition: A | Discharge status: Home / Self Care | Payer: BC Managed Care – PPO | Source: Ambulatory Visit | Attending: Nephrology | Admitting: Nephrology

## 2022-10-10 VITALS — BP 177/84 | HR 72 | Temp 97.6°F | Resp 17

## 2022-10-10 DIAGNOSIS — N183 Chronic kidney disease, stage 3 unspecified: Secondary | ICD-10-CM | POA: Diagnosis not present

## 2022-10-10 MED ORDER — EPOETIN ALFA-EPBX 10000 UNIT/ML IJ SOLN
20000.0000 [IU] | INTRAMUSCULAR | Status: DC
  Administered 2022-10-10: 20000 [IU] via SUBCUTANEOUS

## 2022-10-10 MED ORDER — EPOETIN ALFA-EPBX 10000 UNIT/ML IJ SOLN
INTRAMUSCULAR | Status: AC
  Filled 2022-10-10: qty 2

## 2022-10-20 ENCOUNTER — Other Ambulatory Visit: Payer: Self-pay | Admitting: Cardiology

## 2022-10-23 ENCOUNTER — Encounter (HOSPITAL_COMMUNITY)
Admission: RE | Admit: 2022-10-23 | Discharge: 2022-10-23 | Disposition: A | Discharge status: Home / Self Care | Payer: BC Managed Care – PPO | Source: Ambulatory Visit | Attending: Nephrology | Admitting: Nephrology

## 2022-10-23 VITALS — BP 150/87 | HR 56 | Resp 17

## 2022-10-23 DIAGNOSIS — N183 Chronic kidney disease, stage 3 unspecified: Secondary | ICD-10-CM | POA: Diagnosis present

## 2022-10-23 LAB — POCT HEMOGLOBIN-HEMACUE: Hemoglobin: 9.3 g/dL — ABNORMAL LOW (ref 13.0–17.0)

## 2022-10-23 LAB — IRON AND TIBC
Iron: 79 ug/dL (ref 45–182)
Saturation Ratios: 33 % (ref 17.9–39.5)
TIBC: 242 ug/dL — ABNORMAL LOW (ref 250–450)
UIBC: 163 ug/dL

## 2022-10-23 LAB — FERRITIN: Ferritin: 262 ng/mL (ref 24–336)

## 2022-10-23 MED ORDER — EPOETIN ALFA-EPBX 10000 UNIT/ML IJ SOLN
INTRAMUSCULAR | Status: AC
  Administered 2022-10-23: 20000 [IU] via SUBCUTANEOUS
  Filled 2022-10-23: qty 2

## 2022-10-23 MED ORDER — EPOETIN ALFA-EPBX 10000 UNIT/ML IJ SOLN: 20000.0000 [IU] | INTRAMUSCULAR | Status: DC

## 2022-10-24 ENCOUNTER — Encounter (HOSPITAL_COMMUNITY): Payer: BC Managed Care – PPO

## 2022-11-06 ENCOUNTER — Encounter (HOSPITAL_COMMUNITY)
Admission: RE | Admit: 2022-11-06 | Discharge: 2022-11-06 | Disposition: A | Discharge status: Home / Self Care | Payer: BC Managed Care – PPO | Source: Ambulatory Visit | Attending: Nephrology | Admitting: Nephrology

## 2022-11-06 VITALS — BP 135/82 | HR 66 | Temp 97.4°F | Resp 17

## 2022-11-06 DIAGNOSIS — N183 Chronic kidney disease, stage 3 unspecified: Secondary | ICD-10-CM

## 2022-11-06 LAB — POCT HEMOGLOBIN-HEMACUE: Hemoglobin: 9.4 g/dL — ABNORMAL LOW (ref 13.0–17.0)

## 2022-11-06 MED ORDER — EPOETIN ALFA-EPBX 10000 UNIT/ML IJ SOLN
INTRAMUSCULAR | Status: AC
  Filled 2022-11-06: qty 2

## 2022-11-06 MED ORDER — EPOETIN ALFA-EPBX 10000 UNIT/ML IJ SOLN
20000.0000 [IU] | INTRAMUSCULAR | Status: DC
  Administered 2022-11-06: 20000 [IU] via SUBCUTANEOUS

## 2022-11-20 ENCOUNTER — Encounter (HOSPITAL_COMMUNITY)
Admission: RE | Admit: 2022-11-20 | Discharge: 2022-11-20 | Disposition: A | Discharge status: Home / Self Care | Payer: BC Managed Care – PPO | Source: Ambulatory Visit | Attending: Nephrology | Admitting: Nephrology

## 2022-11-20 VITALS — BP 156/99 | HR 67 | Resp 18

## 2022-11-20 DIAGNOSIS — N183 Chronic kidney disease, stage 3 unspecified: Secondary | ICD-10-CM | POA: Insufficient documentation

## 2022-11-20 LAB — IRON AND TIBC
Iron: 92 ug/dL (ref 45–182)
Saturation Ratios: 37 % (ref 17.9–39.5)
TIBC: 252 ug/dL (ref 250–450)
UIBC: 160 ug/dL

## 2022-11-20 LAB — FERRITIN: Ferritin: 496 ng/mL — ABNORMAL HIGH (ref 24–336)

## 2022-11-20 LAB — POCT HEMOGLOBIN-HEMACUE: Hemoglobin: 9.4 g/dL — ABNORMAL LOW (ref 13.0–17.0)

## 2022-11-20 MED ORDER — EPOETIN ALFA-EPBX 10000 UNIT/ML IJ SOLN
20000.0000 [IU] | INTRAMUSCULAR | Status: DC
  Administered 2022-11-20: 20000 [IU] via SUBCUTANEOUS

## 2022-11-20 MED ORDER — EPOETIN ALFA-EPBX 10000 UNIT/ML IJ SOLN
INTRAMUSCULAR | Status: AC
  Filled 2022-11-20: qty 2

## 2022-11-21 ENCOUNTER — Emergency Department (HOSPITAL_COMMUNITY)
Admission: EM | Admit: 2022-11-21 | Discharge: 2022-11-21 | Disposition: A | Discharge status: Home / Self Care | Payer: BC Managed Care – PPO | Attending: Emergency Medicine | Admitting: Emergency Medicine

## 2022-11-21 ENCOUNTER — Encounter (HOSPITAL_COMMUNITY): Payer: Self-pay | Admitting: Emergency Medicine

## 2022-11-21 ENCOUNTER — Other Ambulatory Visit: Payer: Self-pay

## 2022-11-21 DIAGNOSIS — E876 Hypokalemia: Secondary | ICD-10-CM | POA: Diagnosis not present

## 2022-11-21 DIAGNOSIS — M62830 Muscle spasm of back: Secondary | ICD-10-CM | POA: Diagnosis not present

## 2022-11-21 DIAGNOSIS — N184 Chronic kidney disease, stage 4 (severe): Secondary | ICD-10-CM | POA: Insufficient documentation

## 2022-11-21 DIAGNOSIS — M545 Low back pain, unspecified: Secondary | ICD-10-CM | POA: Diagnosis present

## 2022-11-21 DIAGNOSIS — M62838 Other muscle spasm: Secondary | ICD-10-CM

## 2022-11-21 LAB — CBC WITH DIFFERENTIAL/PLATELET
Abs Immature Granulocytes: 0.31 10*3/uL — ABNORMAL HIGH (ref 0.00–0.07)
Basophils Absolute: 0.1 10*3/uL (ref 0.0–0.1)
Basophils Relative: 0 %
Eosinophils Absolute: 0 10*3/uL (ref 0.0–0.5)
Eosinophils Relative: 0 %
HCT: 23.7 % — ABNORMAL LOW (ref 39.0–52.0)
Hemoglobin: 8.7 g/dL — ABNORMAL LOW (ref 13.0–17.0)
Immature Granulocytes: 2 %
Lymphocytes Relative: 17 %
Lymphs Abs: 3.2 10*3/uL (ref 0.7–4.0)
MCH: 30.7 pg (ref 26.0–34.0)
MCHC: 36.7 g/dL — ABNORMAL HIGH (ref 30.0–36.0)
MCV: 83.7 fL (ref 80.0–100.0)
Monocytes Absolute: 2.6 10*3/uL — ABNORMAL HIGH (ref 0.1–1.0)
Monocytes Relative: 13 %
Neutro Abs: 13.2 10*3/uL — ABNORMAL HIGH (ref 1.7–7.7)
Neutrophils Relative %: 68 %
Platelets: 331 10*3/uL (ref 150–400)
RBC: 2.83 MIL/uL — ABNORMAL LOW (ref 4.22–5.81)
RDW: 16.4 % — ABNORMAL HIGH (ref 11.5–15.5)
WBC: 19.4 10*3/uL — ABNORMAL HIGH (ref 4.0–10.5)
nRBC: 1.3 % — ABNORMAL HIGH (ref 0.0–0.2)

## 2022-11-21 LAB — BASIC METABOLIC PANEL
Anion gap: 10 (ref 5–15)
BUN: 66 mg/dL — ABNORMAL HIGH (ref 6–20)
CO2: 21 mmol/L — ABNORMAL LOW (ref 22–32)
Calcium: 8.5 mg/dL — ABNORMAL LOW (ref 8.9–10.3)
Chloride: 107 mmol/L (ref 98–111)
Creatinine, Ser: 3.92 mg/dL — ABNORMAL HIGH (ref 0.61–1.24)
GFR, Estimated: 18 mL/min — ABNORMAL LOW (ref >60)
Glucose, Bld: 97 mg/dL (ref 70–99)
Potassium: 3.2 mmol/L — ABNORMAL LOW (ref 3.5–5.1)
Sodium: 138 mmol/L (ref 135–145)

## 2022-11-21 LAB — MAGNESIUM: Magnesium: 2.2 mg/dL (ref 1.7–2.4)

## 2022-11-21 MED ORDER — ONDANSETRON 4 MG PO TBDP
8.0000 mg | ORAL_TABLET | Freq: Once | ORAL | Status: DC
  Filled 2022-11-21: qty 2

## 2022-11-21 MED ORDER — OXYCODONE HCL 5 MG PO TABS
5.0000 mg | ORAL_TABLET | ORAL | 0 refills | Status: DC | PRN
Start: 2022-11-21 — End: 2023-04-29

## 2022-11-21 MED ORDER — POTASSIUM CHLORIDE CRYS ER 20 MEQ PO TBCR
40.0000 meq | EXTENDED_RELEASE_TABLET | Freq: Every day | ORAL | 0 refills | Status: DC
Start: 2022-11-21 — End: 2023-04-29

## 2022-11-21 MED ORDER — MORPHINE SULFATE (PF) 4 MG/ML IV SOLN
4.0000 mg | Freq: Once | INTRAVENOUS | Status: AC
  Administered 2022-11-21: 4 mg via INTRAVENOUS
  Filled 2022-11-21: qty 1

## 2022-11-21 MED ORDER — POTASSIUM CHLORIDE CRYS ER 20 MEQ PO TBCR
40.0000 meq | EXTENDED_RELEASE_TABLET | Freq: Once | ORAL | Status: AC
  Administered 2022-11-21: 40 meq via ORAL
  Filled 2022-11-21: qty 2

## 2022-11-21 MED ORDER — DIAZEPAM 2 MG PO TABS
2.0000 mg | ORAL_TABLET | Freq: Four times a day (QID) | ORAL | 0 refills | Status: DC | PRN
Start: 2022-11-21 — End: 2023-04-29

## 2022-11-21 MED ORDER — LIDOCAINE 5 % EX PTCH
1.0000 | MEDICATED_PATCH | Freq: Once | CUTANEOUS | Status: DC
  Administered 2022-11-21: 1 via TRANSDERMAL
  Filled 2022-11-21: qty 1

## 2022-11-21 MED ORDER — HYDROCODONE-ACETAMINOPHEN 5-325 MG PO TABS
1.0000 | ORAL_TABLET | Freq: Once | ORAL | Status: DC
  Filled 2022-11-21: qty 1

## 2022-11-21 MED ORDER — ONDANSETRON HCL 4 MG/2ML IJ SOLN
4.0000 mg | Freq: Once | INTRAMUSCULAR | Status: AC
  Administered 2022-11-21: 4 mg via INTRAVENOUS
  Filled 2022-11-21: qty 2

## 2022-11-21 MED ORDER — METHYLPREDNISOLONE SODIUM SUCC 125 MG IJ SOLR
125.0000 mg | Freq: Once | INTRAMUSCULAR | Status: AC
  Administered 2022-11-21: 125 mg via INTRAVENOUS
  Filled 2022-11-21: qty 2

## 2022-11-21 MED ORDER — DIAZEPAM 2 MG PO TABS
2.0000 mg | ORAL_TABLET | Freq: Once | ORAL | Status: AC
  Administered 2022-11-21: 2 mg via ORAL
  Filled 2022-11-21: qty 1

## 2022-11-21 NOTE — Discharge Instructions (Addendum)
Your workup today was overall reassuring.  Creatinine was 3.92 which is slightly improved from what the labs showed from yesterday.  Your muscle spasm states improved somewhat after the medications.  I will send in value for a few days into the pharmacy along with potassium supplement since your potassium was slightly low at 3.2.  Will also send in narcotic pain medication to keep on hand for severe breakthrough pain.  In addition of these medications take Tylenol 1000 mg every 8 hours.  For any concerning symptoms return to the emergency room otherwise follow-up with your primary care provider in the next couple days.

## 2022-11-21 NOTE — ED Triage Notes (Signed)
Patient arrives in wheelchair c/o lower back and leg cramps. Patient states he recently had a gout flare up in his right knee that his doctor gave him meds that has now moved into right foot. States due to that pain is having to put a lot of pressure on left leg. Patient states he also had CKD4 and was seen by his PCP yesterday- given flu shot and another injection for kidneys.

## 2022-11-21 NOTE — ED Provider Notes (Signed)
[place] EMERGENCY DEPARTMENT AT [facility] Provider Note   CSN: [id number] Arrival date & time: 11/21/22  8295     History  Chief Complaint  Patient presents with   Back Pain    George Erickson is a 52 y.o. male.  52 year old male with past medical history significant for CKD 4, recent flareup of gout presents today for concern of muscle spasms and low back pain.  No traumatic injury.  Denies any difficulty urinating, saddle anesthesia, fever.  Describes muscle spasms that started yesterday and almost became constant sometime overnight.  Had lab work done yesterday.  Creatinine was 4.2.  The history is provided by the patient. No language interpreter was used.       Home Medications Prior to Admission medications   Medication Sig Start Date End Date Taking? Authorizing Provider  diazepam (VALIUM) 2 MG tablet Take 1 tablet (2 mg total) by mouth every 6 (six) hours as needed for muscle spasms. 11/21/22  Yes Saisha Hogue, PA-C  oxyCODONE (ROXICODONE) 5 MG immediate release tablet Take 1 tablet (5 mg total) by mouth every 4 (four) hours as needed for severe pain. 11/21/22  Yes Abass Misener, PA-C  potassium chloride SA (KLOR-CON M) 20 MEQ tablet Take 2 tablets (40 mEq total) by mouth daily for 5 days. 11/21/22 11/26/22 Yes Coutney Wildermuth, PA-C  amLODipine (NORVASC) 10 MG tablet Take 1 tablet by mouth daily.    [provider]  calcitRIOL (ROCALTROL) 0.25 MCG capsule Take 0.25 mcg by mouth daily. 11/28/21   [provider]  carvedilol (COREG) 6.25 MG tablet Take 1 tablet by mouth twice daily 06/27/22   Marykay Lex, MD  clopidogrel (PLAVIX) 75 MG tablet TAKE 1 TABLET BY MOUTH ONCE DAILY START  AFTER  YOU  COMPLETE  BRILINTA 05/26/22   Marykay Lex, MD  epoetin alfa (PROCRIT) 62130 UNIT/ML injection  10/11/21   [provider]  hydrALAZINE (APRESOLINE) 100 MG tablet Take 100 mg by mouth 2 (two) times daily.    [provider]  nitroGLYCERIN  (NITROSTAT) 0.4 MG SL tablet Place 1 tablet (0.4 mg total) under the tongue every 5 (five) minutes as needed for chest pain. 01/23/20   Marykay Lex, MD  nitroGLYCERIN (NITROSTAT) 0.4 MG SL tablet See admin instructions. 08/19/20   [provider]  pravastatin (PRAVACHOL) 40 MG tablet Take 1 tablet (40 mg total) by mouth every evening. 10/20/22   Marykay Lex, MD  PROAIR RESPICLICK 108 [phone] Base) MCG/ACT AEPB Inhale 2 puffs into the lungs every 4 (four) hours as needed (SOB/Wheezing). 01/08/20   [provider]  sennosides-docusate sodium (SENOKOT-S) 8.6-50 MG tablet Take 1 tablet by mouth daily as needed for constipation.    [provider]  sodium bicarbonate 650 MG tablet Take 1,300 mg by mouth 2 (two) times daily. 11/23/21   [provider]  vitamin C (ASCORBIC ACID) 500 MG tablet Take 500 mg by mouth daily.    [provider]      Allergies    Niacin and related and Zocor [simvastatin-high dose]    Review of Systems   Review of Systems  Constitutional:  Negative for fever.  Respiratory:  Negative for shortness of breath.   Cardiovascular:  Negative for chest pain.  Gastrointestinal:  Negative for abdominal pain.  Genitourinary:  Negative for difficulty urinating and dysuria.  Musculoskeletal:  Positive for back pain.  Neurological:  Negative for light-headedness.  All other systems reviewed and are  negative.   Physical Exam Updated Vital Signs BP (!) 148/81   Pulse 65   Temp 98.6 F (37 C) (Oral)   Resp 16   Ht 6' (1.829 m)   Wt 104.3 kg   SpO2 98%   BMI 31.19 kg/m  Physical Exam Vitals and nursing note reviewed.  Constitutional:      General: He is not in acute distress.    Appearance: Normal appearance. He is not ill-appearing.  HENT:     Head: Normocephalic and atraumatic.     Nose: Nose normal.  Eyes:     Conjunctiva/sclera: Conjunctivae normal.  Cardiovascular:     Rate and Rhythm: Normal rate.  Pulmonary:      Effort: Pulmonary effort is normal. No respiratory distress.  Abdominal:     General: There is no distension.     Palpations: Abdomen is soft.     Tenderness: There is no abdominal tenderness. There is no guarding.  Musculoskeletal:        General: No deformity. Normal range of motion.     Cervical back: Normal range of motion.     Comments: Cervical, thoracic and lumbar spine without tenderness palpation.  Full range of motion bilateral lower extremities.  5/5 strength in extensor and flexor muscles.  Neurovascularly intact.  Skin:    Findings: No rash.  Neurological:     Mental Status: He is alert.     ED Results / Procedures / Treatments   Labs (all labs ordered are listed, but only abnormal results are displayed) Labs Reviewed  CBC WITH DIFFERENTIAL/PLATELET - Abnormal; Notable for the following components:      Result Value   WBC 19.4 (*)    RBC 2.83 (*)    Hemoglobin 8.7 (*)    HCT 23.7 (*)    MCHC 36.7 (*)    RDW 16.4 (*)    nRBC 1.3 (*)    Neutro Abs 13.2 (*)    Monocytes Absolute 2.6 (*)    Abs Immature Granulocytes 0.31 (*)    All other components within normal limits  BASIC METABOLIC PANEL - Abnormal; Notable for the following components:   Potassium 3.2 (*)    CO2 21 (*)    BUN 66 (*)    Creatinine, Ser 3.92 (*)    Calcium 8.5 (*)    GFR, Estimated 18 (*)    All other components within normal limits  MAGNESIUM    EKG None  Radiology No results found.  Procedures Procedures    Medications Ordered in ED Medications  lidocaine (LIDODERM) 5 % 1 patch (1 patch Transdermal Patch Applied 11/21/22 1321)  diazepam (VALIUM) tablet 2 mg (2 mg Oral Given 11/21/22 1310)  methylPREDNISolone sodium succinate (SOLU-MEDROL) 125 mg/2 mL injection 125 mg (125 mg Intravenous Given 11/21/22 1320)  morphine (PF) 4 MG/ML injection 4 mg (4 mg Intravenous Given 11/21/22 1321)  ondansetron (ZOFRAN) injection 4 mg (4 mg Intravenous Given 11/21/22 1321)    ED Course/ Medical  Decision Making/ A&P                                 Medical Decision Making Amount and/or Complexity of Data Reviewed Labs: ordered.  Risk Prescription drug management.   52 year old male presents today for concern of lower back pain and muscle spasms.  Started yesterday and got worse overnight.  Hemodynamically stable.  No acute distress.  No red flag signs or symptoms  concerning for cauda equina syndrome or spinal epidural abscess.  Multimodal pain control given in the emergency department.  Labs obtained to ensure electrolytes are within normal.  Pain improved after multimodal pain control.  Will prescribe short course of Roxicodone, Valium, and potassium.  Potassium 3.2 in the emergency department.  40 mEq of supplemental potassium given.  Discussed close follow-up with PCP within the next 48 hours.  Patient voices understanding and is in agreement with plan.  Stable for discharge.  Leukocytosis on CBC likely reactive to recent prednisone course for gout flareup.  Magnesium 2.2.  Creatinine is 3.92 which is slightly down trended from yesterday.  He had labs done at an outside facility.  He pulls them up on his phone.  Creatinine was 4.2.   Final Clinical Impression(s) / ED Diagnoses Final diagnoses:  Muscle spasm  Hypokalemia    Rx / DC Orders ED Discharge Orders          Ordered    diazepam (VALIUM) 2 MG tablet  Every 6 hours PRN        11/21/22 1511    oxyCODONE (ROXICODONE) 5 MG immediate release tablet  Every 4 hours PRN        11/21/22 1511    potassium chloride SA (KLOR-CON M) 20 MEQ tablet  Daily        11/21/22 1511              Marita Kansas, PA-C 11/21/22 1520    Linwood Dibbles, MD 11/21/22 1615

## 2022-11-24 ENCOUNTER — Ambulatory Visit (INDEPENDENT_AMBULATORY_CARE_PROVIDER_SITE_OTHER): Payer: BC Managed Care – PPO | Admitting: Podiatry

## 2022-11-24 ENCOUNTER — Encounter (HOSPITAL_COMMUNITY): Payer: Self-pay

## 2022-11-24 ENCOUNTER — Encounter: Payer: Self-pay | Admitting: Podiatry

## 2022-11-24 DIAGNOSIS — M10371 Gout due to renal impairment, right ankle and foot: Secondary | ICD-10-CM

## 2022-11-24 DIAGNOSIS — M79671 Pain in right foot: Secondary | ICD-10-CM

## 2022-11-24 DIAGNOSIS — M1A0710 Idiopathic chronic gout, right ankle and foot, without tophus (tophi): Secondary | ICD-10-CM

## 2022-11-25 ENCOUNTER — Emergency Department (HOSPITAL_BASED_OUTPATIENT_CLINIC_OR_DEPARTMENT_OTHER)
Admission: EM | Admit: 2022-11-25 | Discharge: 2022-11-25 | Disposition: A | Discharge status: Home / Self Care | Payer: BC Managed Care – PPO | Attending: Emergency Medicine | Admitting: Emergency Medicine

## 2022-11-25 ENCOUNTER — Emergency Department (HOSPITAL_BASED_OUTPATIENT_CLINIC_OR_DEPARTMENT_OTHER): Payer: BC Managed Care – PPO | Admitting: Radiology

## 2022-11-25 ENCOUNTER — Other Ambulatory Visit: Payer: Self-pay

## 2022-11-25 ENCOUNTER — Encounter (HOSPITAL_BASED_OUTPATIENT_CLINIC_OR_DEPARTMENT_OTHER): Payer: Self-pay | Admitting: Emergency Medicine

## 2022-11-25 ENCOUNTER — Emergency Department (HOSPITAL_COMMUNITY): Payer: BC Managed Care – PPO

## 2022-11-25 DIAGNOSIS — M5432 Sciatica, left side: Secondary | ICD-10-CM | POA: Diagnosis not present

## 2022-11-25 DIAGNOSIS — I129 Hypertensive chronic kidney disease with stage 1 through stage 4 chronic kidney disease, or unspecified chronic kidney disease: Secondary | ICD-10-CM | POA: Insufficient documentation

## 2022-11-25 DIAGNOSIS — I2510 Atherosclerotic heart disease of native coronary artery without angina pectoris: Secondary | ICD-10-CM | POA: Insufficient documentation

## 2022-11-25 DIAGNOSIS — Z79899 Other long term (current) drug therapy: Secondary | ICD-10-CM | POA: Diagnosis not present

## 2022-11-25 DIAGNOSIS — N184 Chronic kidney disease, stage 4 (severe): Secondary | ICD-10-CM | POA: Diagnosis not present

## 2022-11-25 DIAGNOSIS — M545 Low back pain, unspecified: Secondary | ICD-10-CM | POA: Diagnosis present

## 2022-11-25 DIAGNOSIS — D72829 Elevated white blood cell count, unspecified: Secondary | ICD-10-CM

## 2022-11-25 LAB — CBC
HCT: 26.1 % — ABNORMAL LOW (ref 39.0–52.0)
Hemoglobin: 9.6 g/dL — ABNORMAL LOW (ref 13.0–17.0)
MCH: 30.6 pg (ref 26.0–34.0)
MCHC: 36.8 g/dL — ABNORMAL HIGH (ref 30.0–36.0)
MCV: 83.1 fL (ref 80.0–100.0)
Platelets: 365 10*3/uL (ref 150–400)
RBC: 3.14 MIL/uL — ABNORMAL LOW (ref 4.22–5.81)
RDW: 17.8 % — ABNORMAL HIGH (ref 11.5–15.5)
WBC: 21.6 10*3/uL — ABNORMAL HIGH (ref 4.0–10.5)
nRBC: 1.6 % — ABNORMAL HIGH (ref 0.0–0.2)

## 2022-11-25 LAB — BASIC METABOLIC PANEL
Anion gap: 12 (ref 5–15)
BUN: 63 mg/dL — ABNORMAL HIGH (ref 6–20)
CO2: 21 mmol/L — ABNORMAL LOW (ref 22–32)
Calcium: 8.8 mg/dL — ABNORMAL LOW (ref 8.9–10.3)
Chloride: 105 mmol/L (ref 98–111)
Creatinine, Ser: 3.8 mg/dL — ABNORMAL HIGH (ref 0.61–1.24)
GFR, Estimated: 18 mL/min — ABNORMAL LOW (ref >60)
Glucose, Bld: 170 mg/dL — ABNORMAL HIGH (ref 70–99)
Potassium: 4.5 mmol/L (ref 3.5–5.1)
Sodium: 138 mmol/L (ref 135–145)

## 2022-11-25 MED ORDER — GABAPENTIN 300 MG PO CAPS
300.0000 mg | ORAL_CAPSULE | Freq: Once | ORAL | Status: AC
  Administered 2022-11-25: 300 mg via ORAL
  Filled 2022-11-25: qty 1

## 2022-11-25 MED ORDER — HYDROMORPHONE HCL 1 MG/ML IJ SOLN
1.0000 mg | Freq: Once | INTRAMUSCULAR | Status: AC
  Administered 2022-11-25: 1 mg via INTRAVENOUS
  Filled 2022-11-25: qty 1

## 2022-11-25 MED ORDER — MORPHINE SULFATE (PF) 4 MG/ML IV SOLN
4.0000 mg | Freq: Once | INTRAVENOUS | Status: AC
  Administered 2022-11-25: 4 mg via INTRAVENOUS
  Filled 2022-11-25: qty 1

## 2022-11-25 MED ORDER — DIAZEPAM 5 MG/ML IJ SOLN
5.0000 mg | Freq: Once | INTRAMUSCULAR | Status: AC | PRN
  Administered 2022-11-25: 5 mg via INTRAVENOUS
  Filled 2022-11-25: qty 2

## 2022-11-25 MED ORDER — ONDANSETRON HCL 4 MG/2ML IJ SOLN
4.0000 mg | Freq: Once | INTRAMUSCULAR | Status: AC
  Administered 2022-11-25: 4 mg via INTRAVENOUS
  Filled 2022-11-25: qty 2

## 2022-11-25 MED ORDER — LIDOCAINE 5 % EX PTCH
1.0000 | MEDICATED_PATCH | CUTANEOUS | Status: DC
  Administered 2022-11-25: 1 via TRANSDERMAL
  Filled 2022-11-25: qty 1

## 2022-11-25 MED ORDER — LIDOCAINE 5 % EX PTCH
1.0000 | MEDICATED_PATCH | CUTANEOUS | 0 refills | Status: AC
  Filled 2022-11-25: qty 30, 30d supply, fill #0

## 2022-11-25 NOTE — Discharge Instructions (Addendum)
1. Right laminectomy at L4-5. Previously seen superior disc extrusion is no longer visible. 2. Mild subarticular narrowing bilaterally at L4-5 is improved compared to the prior exam. 3. Mild foraminal narrowing bilaterally at L4-5 has progressed slightly. 4. Left laminectomy at L5-S1 with mild left subarticular narrowing improved. 5. Mild left foraminal narrowing at L5-S1 is similar to the prior exam. 6. Mild disc bulging and facet hypertrophy at L2-3 and L3-4 without significant stenosis.

## 2022-11-25 NOTE — ED Notes (Signed)
[facility] called @ 1444 for Neursurg consult.

## 2022-11-25 NOTE — ED Notes (Signed)
Patient arriving from [facility] reference MRI. Patient able to take a few steps from EMS stretcher to bed. Once settled, he was in no apparent distress. Patient awaiting MRI.

## 2022-11-25 NOTE — ED Notes (Signed)
[facility] called for ED to ED tx via [facility]

## 2022-11-25 NOTE — ED Notes (Addendum)
Secured chat report with Asher Muir at [facility]. Awaiting response. Spoke with Homero Fellers with [facility]. ETA 20 minutes.

## 2022-11-25 NOTE — ED Provider Notes (Signed)
[place] EMERGENCY DEPARTMENT AT [facility] Provider Note   CSN: [id number] Arrival date & time: 11/25/22  1249     History  No chief complaint on file.   George Erickson is a 52 y.o. male.  HPI Patient with low back pain rating on the left leg.  History of sciatica.  Has had previous surgery.  Also has had recent gout in his knee and then foot.  Thinks this is why he was walking differently and then potentially cause the back pain.  Had been seen in the ER for the same.  Had been on steroids for the gout and then also had shot of steroids here along with symptomatic treatment.  Has had continued pain.  States he felt he had been doing better with rest but then had to get up and go to his podiatrist and PCP and pain got worse.  Had lumbar surgery by Dr. Jake Samples 2 years ago but is also seeing Dr. Yevette Edwards, however he did not operat on him.  Patient also has had worsening kidney function.  Reportedly is on the transplant list.   Past Medical History:  Diagnosis Date   Allergic rhinitis due to pollen    Arthritis    lower back   CAD S/P DES PCI D1 & OM3 11/15/2009   a) NSTEMI - 100% D1 (DES PCI - Promus DES 2.25 x 23 -- 2.3 mm); otherwise minimal CAD in a co-dominant system;; b) 02/06/20: OM3 80% (DES PCI), D1 70% ISR, mRCA 60-65%.    Chronic left shoulder pain    Impingement syndrome   Erectile dysfunction    Gout    Hyperlipidemia with target LDL less than 70    Associated CAD   Hypertension    Hypertensive kidney disease with CKD stage IV (HCC)    Cr as of August 2021 1.92   Non-ST elevation (NSTEMI) myocardial infarction (HCC) 11/2009; 01/2020   a) 9/'11: Cath - 100% D1 --> DES PCI Promus 2.25 x 23 (2.3 mm).  EF 55% with anterolateral HK. -> patent stent in Sept 2012 & 2015; b) 02/05/2020: NSTEMI - Echo EF >55% no RWMA - 80% OM3 (DES PCI), 70% D1 ISR, 60-65% mRCA.   Obesity (BMI 30.0-34.9)    OSA on CPAP    uses cpap   Vertigo     Home Medications Prior to  Admission medications   Medication Sig Start Date End Date Taking? Authorizing Provider  amLODipine (NORVASC) 10 MG tablet Take 1 tablet by mouth daily.    [provider]  calcitRIOL (ROCALTROL) 0.25 MCG capsule Take 0.25 mcg by mouth daily. 11/28/21   [provider]  carvedilol (COREG) 6.25 MG tablet Take 1 tablet by mouth twice daily 06/27/22   Marykay Lex, MD  clopidogrel (PLAVIX) 75 MG tablet TAKE 1 TABLET BY MOUTH ONCE DAILY START  AFTER  YOU  COMPLETE  BRILINTA 05/26/22   Marykay Lex, MD  diazepam (VALIUM) 2 MG tablet Take 1 tablet (2 mg total) by mouth every 6 (six) hours as needed for muscle spasms. 11/21/22   Marita Kansas, PA-C  epoetin alfa (PROCRIT) 30865 UNIT/ML injection  10/11/21   [provider]  hydrALAZINE (APRESOLINE) 100 MG tablet Take 100 mg by mouth 2 (two) times daily.    [provider]  nitroGLYCERIN (NITROSTAT) 0.4 MG SL tablet Place 1 tablet (0.4 mg total) under the tongue every 5 (five) minutes as needed for chest pain. 01/23/20   Marykay Lex,  MD  nitroGLYCERIN (NITROSTAT) 0.4 MG SL tablet See admin instructions. 08/19/20   [provider]  oxyCODONE (ROXICODONE) 5 MG immediate release tablet Take 1 tablet (5 mg total) by mouth every 4 (four) hours as needed for severe pain. 11/21/22   Karie Mainland, Amjad, PA-C  potassium chloride SA (KLOR-CON M) 20 MEQ tablet Take 2 tablets (40 mEq total) by mouth daily for 5 days. 11/21/22 11/26/22  Marita Kansas, PA-C  pravastatin (PRAVACHOL) 40 MG tablet Take 1 tablet (40 mg total) by mouth every evening. 10/20/22   Marykay Lex, MD  PROAIR RESPICLICK 108 [phone] Base) MCG/ACT AEPB Inhale 2 puffs into the lungs every 4 (four) hours as needed (SOB/Wheezing). 01/08/20   [provider]  sennosides-docusate sodium (SENOKOT-S) 8.6-50 MG tablet Take 1 tablet by mouth daily as needed for constipation.    [provider]  sodium bicarbonate 650 MG tablet Take 1,300 mg by mouth 2 (two) times  daily. 11/23/21   [provider]  vitamin C (ASCORBIC ACID) 500 MG tablet Take 500 mg by mouth daily.    [provider]      Allergies    Niacin and related and Zocor [simvastatin-high dose]    Review of Systems   Review of Systems  Physical Exam Updated Vital Signs BP (!) 176/88 (BP Location: Right Arm)   Pulse 65   Temp 99.5 F (37.5 C) (Oral)   Resp 18   SpO2 96%  Physical Exam Vitals and nursing note reviewed.  Musculoskeletal:        General: Tenderness present.     Cervical back: Neck supple.     Comments: Tenderness on the left SI area.  Does have pain radiation down the leg with palpation in this area.  Sensation intact distally.  Improvement of right foot gout had been present.  Neurological:     Mental Status: He is alert.     ED Results / Procedures / Treatments   Labs (all labs ordered are listed, but only abnormal results are displayed) Labs Reviewed  BASIC METABOLIC PANEL - Abnormal; Notable for the following components:      Result Value   CO2 21 (*)    Glucose, Bld 170 (*)    BUN 63 (*)    Creatinine, Ser 3.80 (*)    Calcium 8.8 (*)    GFR, Estimated 18 (*)    All other components within normal limits  CBC - Abnormal; Notable for the following components:   WBC 21.6 (*)    RBC 3.14 (*)    Hemoglobin 9.6 (*)    HCT 26.1 (*)    MCHC 36.8 (*)    RDW 17.8 (*)    nRBC 1.6 (*)    All other components within normal limits    EKG None  Radiology DG Lumbar Spine Complete  Result Date: 11/25/2022 CLINICAL DATA:  Worsening back pain EXAM: LUMBAR SPINE - COMPLETE 5 VIEW COMPARISON:  MRI lumbar spine dated 08/06/2020, lumbar spine radiograph dated 08/19/2020 FINDINGS: There is no evidence of lumbar spine fracture. Similar grade 1 anterolisthesis at L4-5 compared to 08/19/2020. Intervertebral disc space narrowing at L5-S1, as before. Multilevel facet arthropathy of the lumbar spine. IMPRESSION: 1. Similar grade 1 anterolisthesis at L4-5  compared to 08/19/2020. 2. Unchanged intervertebral disc space narrowing at L5-S1 and multilevel facet arthropathy of the lumbar spine. Electronically Signed   By: Agustin Cree M.D.   On: 11/25/2022 14:17    Procedures Procedures    Medications  Ordered in ED Medications  morphine (PF) 4 MG/ML injection 4 mg (4 mg Intravenous Given 11/25/22 1333)  ondansetron (ZOFRAN) injection 4 mg (4 mg Intravenous Given 11/25/22 1332)  HYDROmorphone (DILAUDID) injection 1 mg (1 mg Intravenous Given 11/25/22 1429)    ED Course/ Medical Decision Making/ A&P                                 Medical Decision Making Amount and/or Complexity of Data Reviewed Labs: ordered. Radiology: ordered.  Risk Prescription drug management.   Patient with low back pain.  Recently seen for same.  No definite trauma but did walk differently due to gout.  Unrelieved pain at home.  Will treat with some morphine here.  Will recheck kidney function since it had worsened.  Kidney function stable to prior.  White count elevated but is on steroids.  Doubt infection as a cause.  Will discuss with Dr. Jake Samples, who is on-call and did his surgery.  Care turned over to Dr Silverio Lay        Final Clinical Impression(s) / ED Diagnoses Final diagnoses:  Sciatica of left side    Rx / DC Orders ED Discharge Orders     None         Benjiman Core, MD 11/25/22 1501

## 2022-11-25 NOTE — ED Provider Notes (Signed)
  Physical Exam  BP (!) 185/94   Pulse 72   Temp 99.5 F (37.5 C) (Oral)   Resp 18   SpO2 96%   Physical Exam  Procedures  Procedures  ED Course / MDM    Medical Decision Making Care assumed at 3 PM.  Patient is here with worsening back pain.  Patient received steroid injection yesterday for gout and is on oral steroids.  Patient did not have back pain during last ED visit but now has worsening back pain and has hard time walking.  His white blood cell count is 21,000.  Signed out pending neurosurgery consult  3:41 PM I discussed case with neurosurgery PA, Esperanza Richters.  She agreed with MRI lumbar spine.  I discussed with Dr. Durwin Nora at [facility], who will be accepting doctor.  Neurosurgery can be consulted if MRI showed epidural abscess or cauda equina.  Unfortunately due to patient's baseline CKD, patient is unable to get MRI with contrast so can only get MRI lumbar spine without contrast.  Patient will be transferred by EMS.  Problems Addressed: Sciatica of left side: acute illness or injury  Amount and/or Complexity of Data Reviewed Labs: ordered. Decision-making details documented in ED Course. Radiology: ordered and independent interpretation performed. Decision-making details documented in ED Course.  Risk Prescription drug management.          Charlynne Pander, MD 11/25/22 [phone]

## 2022-11-25 NOTE — ED Triage Notes (Signed)
Pt was seen on Tuesday,has been taking the prescribed meds. Today it is feeling worse, nothing helps, he has hx of disc lying/pinching on his sciatic nerve.

## 2022-11-25 NOTE — ED Notes (Signed)
Patient transported to MRI 

## 2022-11-25 NOTE — ED Provider Notes (Signed)
[place] EMERGENCY DEPARTMENT AT [facility] Provider Note   CSN: [id number] Arrival date & time: 11/25/22  1249     History  Chief Complaint  Patient presents with   Back Pain    George Erickson is a 52 y.o. male.   Back Pain Patient reports that in the past week he has had worsening left-sided back pain that radiates down his leg.  He was initially seen for this a few days ago and had adequate pain control while in the ED.  He initially was feeling better outpatient however today his symptoms again worsened leading him to return to the ED.  He does report that in the past few weeks he has had a flareup of his gout which is led to him favoring his left leg.  Denies fever, chills, IV drug use, trauma.     Home Medications Prior to Admission medications   Medication Sig Start Date End Date Taking? Authorizing Provider  lidocaine (LIDODERM) 5 % Place 1 patch onto the skin daily. Remove & Discard patch within 12 hours or as directed by MD 11/25/22  Yes Claretha Cooper, DO  amLODipine (NORVASC) 10 MG tablet Take 1 tablet by mouth daily.    [provider]  calcitRIOL (ROCALTROL) 0.25 MCG capsule Take 0.25 mcg by mouth daily. 11/28/21   [provider]  carvedilol (COREG) 6.25 MG tablet Take 1 tablet by mouth twice daily 06/27/22   Marykay Lex, MD  clopidogrel (PLAVIX) 75 MG tablet TAKE 1 TABLET BY MOUTH ONCE DAILY START  AFTER  YOU  COMPLETE  BRILINTA 05/26/22   Marykay Lex, MD  diazepam (VALIUM) 2 MG tablet Take 1 tablet (2 mg total) by mouth every 6 (six) hours as needed for muscle spasms. 11/21/22   Marita Kansas, PA-C  epoetin alfa (PROCRIT) 10932 UNIT/ML injection  10/11/21   [provider]  hydrALAZINE (APRESOLINE) 100 MG tablet Take 100 mg by mouth 2 (two) times daily.    [provider]  nitroGLYCERIN (NITROSTAT) 0.4 MG SL tablet Place 1 tablet (0.4 mg total) under the tongue every 5 (five) minutes as needed for chest pain.  01/23/20   Marykay Lex, MD  nitroGLYCERIN (NITROSTAT) 0.4 MG SL tablet See admin instructions. 08/19/20   [provider]  oxyCODONE (ROXICODONE) 5 MG immediate release tablet Take 1 tablet (5 mg total) by mouth every 4 (four) hours as needed for severe pain. 11/21/22   Karie Mainland, Amjad, PA-C  potassium chloride SA (KLOR-CON M) 20 MEQ tablet Take 2 tablets (40 mEq total) by mouth daily for 5 days. 11/21/22 11/26/22  Marita Kansas, PA-C  pravastatin (PRAVACHOL) 40 MG tablet Take 1 tablet (40 mg total) by mouth every evening. 10/20/22   Marykay Lex, MD  PROAIR RESPICLICK 108 [phone] Base) MCG/ACT AEPB Inhale 2 puffs into the lungs every 4 (four) hours as needed (SOB/Wheezing). 01/08/20   [provider]  sennosides-docusate sodium (SENOKOT-S) 8.6-50 MG tablet Take 1 tablet by mouth daily as needed for constipation.    [provider]  sodium bicarbonate 650 MG tablet Take 1,300 mg by mouth 2 (two) times daily. 11/23/21   [provider]  vitamin C (ASCORBIC ACID) 500 MG tablet Take 500 mg by mouth daily.    [provider]      Allergies    Niacin and related and Zocor [simvastatin-high dose]    Review of Systems   Review of Systems  Musculoskeletal:  Positive for back  pain.    Physical Exam Updated Vital Signs BP (!) 184/82   Pulse 66   Temp 98.5 F (36.9 C) (Oral)   Resp 18   Ht 6' (1.829 m)   Wt 108.9 kg   SpO2 95%   BMI 32.55 kg/m  Physical Exam Vitals and nursing note reviewed.  Constitutional:      General: He is in acute distress (Pain).     Appearance: He is well-developed.  HENT:     Head: Normocephalic and atraumatic.  Eyes:     Conjunctiva/sclera: Conjunctivae normal.  Cardiovascular:     Rate and Rhythm: Normal rate and regular rhythm.     Heart sounds: No murmur heard. Pulmonary:     Effort: Pulmonary effort is normal. No respiratory distress.     Breath sounds: Normal breath sounds.  Abdominal:     Palpations: Abdomen is  soft.     Tenderness: There is no abdominal tenderness.  Musculoskeletal:     Cervical back: Neck supple.     Comments: Tenderness to palpation over the left SI joint.  No midline spinal tenderness.  Skin:    General: Skin is warm and dry.     Capillary Refill: Capillary refill takes less than 2 seconds.  Neurological:     Mental Status: He is alert.     ED Results / Procedures / Treatments   Labs (all labs ordered are listed, but only abnormal results are displayed) Labs Reviewed  BASIC METABOLIC PANEL - Abnormal; Notable for the following components:      Result Value   CO2 21 (*)    Glucose, Bld 170 (*)    BUN 63 (*)    Creatinine, Ser 3.80 (*)    Calcium 8.8 (*)    GFR, Estimated 18 (*)    All other components within normal limits  CBC - Abnormal; Notable for the following components:   WBC 21.6 (*)    RBC 3.14 (*)    Hemoglobin 9.6 (*)    HCT 26.1 (*)    MCHC 36.8 (*)    RDW 17.8 (*)    nRBC 1.6 (*)    All other components within normal limits    EKG None  Radiology MR LUMBAR SPINE WO CONTRAST  Result Date: 11/25/2022 CLINICAL DATA:  Low back pain. Prior surgery. Chronic kidney disease. EXAM: MRI LUMBAR SPINE WITHOUT CONTRAST TECHNIQUE: Multiplanar, multisequence MR imaging of the lumbar spine was performed. No intravenous contrast was administered. COMPARISON:  Lumbar spine radiographs 11/25/2022 MR of the lumbar spine without contrast 08/06/20 FINDINGS: Segmentation: 5 non rib-bearing lumbar type vertebral bodies are present. The lowest fully formed vertebral body is L5. Alignment: 3 mm grade 1 anterolisthesis at L4-5 has progressed since 2022. No other significant listhesis is present. Rightward curvature of the lumbar spine is centered at L2-3. Vertebrae: Marrow signal is diffusely depressed, consistent with chronic renal insufficiency. Chronic type 2 Modic changes at L4-5 and L5-S1 have progressed since the prior exam. Conus medullaris and cauda equina: Conus  extends to the L2 level. Conus and cauda equina appear normal. Paraspinal and other soft tissues: Bilateral renal atrophy is present. No solid lesions are present. No significant adenopathy is present. Disc levels: L1-2: Normal disc signal and height is present. No focal protrusion or stenosis is present. L2-3: Mild disc bulging and facet hypertrophy is present. No focal stenosis is present. L3-4: A broad-based disc protrusion is present. Moderate facet hypertrophy present bilaterally. No significant stenosis is present. L4-5: Right  laminectomy is again noted. Previously seen superior disc extrusion is no longer visible. Uncovering of a broad-based disc protrusion is present. Mild subarticular narrowing bilaterally is improved compared to the prior exam. Mild foraminal narrowing bilaterally has progressed slightly. L5-S1: A left paramedian disc protrusion is present. Left laminectomy is noted. Mild left subarticular narrowing is improved. Mild left foraminal narrowing is similar to the prior exam. IMPRESSION: 1. Right laminectomy at L4-5. Previously seen superior disc extrusion is no longer visible. 2. Mild subarticular narrowing bilaterally at L4-5 is improved compared to the prior exam. 3. Mild foraminal narrowing bilaterally at L4-5 has progressed slightly. 4. Left laminectomy at L5-S1 with mild left subarticular narrowing improved. 5. Mild left foraminal narrowing at L5-S1 is similar to the prior exam. 6. Mild disc bulging and facet hypertrophy at L2-3 and L3-4 without significant stenosis. Electronically Signed   By: Marin Roberts M.D.   On: 11/25/2022 18:52   DG Lumbar Spine Complete  Result Date: 11/25/2022 CLINICAL DATA:  Worsening back pain EXAM: LUMBAR SPINE - COMPLETE 5 VIEW COMPARISON:  MRI lumbar spine dated 08/06/2020, lumbar spine radiograph dated 08/19/2020 FINDINGS: There is no evidence of lumbar spine fracture. Similar grade 1 anterolisthesis at L4-5 compared to 08/19/2020.  Intervertebral disc space narrowing at L5-S1, as before. Multilevel facet arthropathy of the lumbar spine. IMPRESSION: 1. Similar grade 1 anterolisthesis at L4-5 compared to 08/19/2020. 2. Unchanged intervertebral disc space narrowing at L5-S1 and multilevel facet arthropathy of the lumbar spine. Electronically Signed   By: Agustin Cree M.D.   On: 11/25/2022 14:17    Procedures Procedures    Medications Ordered in ED Medications  lidocaine (LIDODERM) 5 % 1 patch (1 patch Transdermal Patch Applied 11/25/22 1927)  morphine (PF) 4 MG/ML injection 4 mg (4 mg Intravenous Given 11/25/22 1333)  ondansetron (ZOFRAN) injection 4 mg (4 mg Intravenous Given 11/25/22 1332)  HYDROmorphone (DILAUDID) injection 1 mg (1 mg Intravenous Given 11/25/22 1429)  HYDROmorphone (DILAUDID) injection 1 mg (1 mg Intravenous Given 11/25/22 1538)  diazepam (VALIUM) injection 5 mg (5 mg Intravenous Given 11/25/22 1709)  gabapentin (NEURONTIN) capsule 300 mg (300 mg Oral Given 11/25/22 1927)  HYDROmorphone (DILAUDID) injection 1 mg (1 mg Intravenous Given 11/25/22 2054)    ED Course/ Medical Decision Making/ A&P                                 Medical Decision Making Amount and/or Complexity of Data Reviewed Labs: ordered. Radiology: ordered.  Risk Prescription drug management.   Patient is a 52 year old male with a history of hypertension, CAD, CKD presenting for back pain.  On my initial evaluation, he is afebrile, hemodynamically stable, in pain.  Reports worsening of his pain today following initial improvement after his recent ED visit.  Does report radiation into the back of his left thigh.  On exam, he has tenderness over the SI joint without midline spinal tenderness.  I reviewed results from patient's ED visit earlier today.  CBC demonstrated significant leukocytosis.  Patient does report that he recently received a steroid injection which is likely responsible for his leukocytosis however given inability to rule  out epidural abscess or other spinal infection with more basic imaging he was sent here for MRI.  MRI obtained and demonstrates some ongoing degenerative change without severe stenosis.  No evidence of cauda equina, hematoma, abscess.  While in the emergency department, patient was given Dilaudid for pain.  Discussed  results with patient.  Recommend multimodal pain control including gabapentin, lidocaine patch, Tylenol, oxycodone.  Patient does have prescriptions at home already.  Additionally recommend close follow-up with PCP as he will likely benefit from physical therapy.  Provided exercises for him to do at home.  Return precautions given including new or worsening pain, numbness, tingling, weakness.  Patient reports understanding and agreement.  Discharged without further acute event under my care in the emergency department.        Final Clinical Impression(s) / ED Diagnoses Final diagnoses:  Sciatica of left side  Leukocytosis, unspecified type    Rx / DC Orders ED Discharge Orders          Ordered    lidocaine (LIDODERM) 5 %  Every 24 hours        11/25/22 1954              Claretha Cooper, DO 11/25/22 2313    Cathren Laine, MD 11/26/22 [phone]

## 2022-11-26 ENCOUNTER — Other Ambulatory Visit (HOSPITAL_BASED_OUTPATIENT_CLINIC_OR_DEPARTMENT_OTHER): Payer: Self-pay

## 2022-11-27 ENCOUNTER — Other Ambulatory Visit: Payer: Self-pay

## 2022-11-27 ENCOUNTER — Encounter (HOSPITAL_COMMUNITY): Payer: Self-pay

## 2022-11-27 ENCOUNTER — Other Ambulatory Visit (HOSPITAL_BASED_OUTPATIENT_CLINIC_OR_DEPARTMENT_OTHER): Payer: Self-pay

## 2022-12-01 ENCOUNTER — Inpatient Hospital Stay (HOSPITAL_COMMUNITY)
Admission: EM | Admit: 2022-12-01 | Discharge: 2022-12-04 | DRG: 281 | Disposition: A | Discharge status: Home / Self Care | Payer: BC Managed Care – PPO | Attending: Cardiovascular Disease | Admitting: Cardiovascular Disease

## 2022-12-01 ENCOUNTER — Encounter (HOSPITAL_COMMUNITY): Payer: Self-pay | Admitting: Emergency Medicine

## 2022-12-01 ENCOUNTER — Other Ambulatory Visit: Payer: Self-pay

## 2022-12-01 ENCOUNTER — Emergency Department (HOSPITAL_COMMUNITY): Payer: BC Managed Care – PPO

## 2022-12-01 DIAGNOSIS — N184 Chronic kidney disease, stage 4 (severe): Secondary | ICD-10-CM | POA: Diagnosis present

## 2022-12-01 DIAGNOSIS — I25119 Atherosclerotic heart disease of native coronary artery with unspecified angina pectoris: Secondary | ICD-10-CM | POA: Diagnosis present

## 2022-12-01 DIAGNOSIS — I2510 Atherosclerotic heart disease of native coronary artery without angina pectoris: Secondary | ICD-10-CM | POA: Diagnosis present

## 2022-12-01 DIAGNOSIS — E785 Hyperlipidemia, unspecified: Secondary | ICD-10-CM | POA: Diagnosis present

## 2022-12-01 DIAGNOSIS — M109 Gout, unspecified: Secondary | ICD-10-CM | POA: Diagnosis present

## 2022-12-01 DIAGNOSIS — Z8249 Family history of ischemic heart disease and other diseases of the circulatory system: Secondary | ICD-10-CM | POA: Diagnosis not present

## 2022-12-01 DIAGNOSIS — I214 Non-ST elevation (NSTEMI) myocardial infarction: Secondary | ICD-10-CM | POA: Diagnosis present

## 2022-12-01 DIAGNOSIS — G4733 Obstructive sleep apnea (adult) (pediatric): Secondary | ICD-10-CM | POA: Diagnosis present

## 2022-12-01 DIAGNOSIS — I252 Old myocardial infarction: Secondary | ICD-10-CM | POA: Diagnosis not present

## 2022-12-01 DIAGNOSIS — Z823 Family history of stroke: Secondary | ICD-10-CM | POA: Diagnosis not present

## 2022-12-01 DIAGNOSIS — Z7902 Long term (current) use of antithrombotics/antiplatelets: Secondary | ICD-10-CM

## 2022-12-01 DIAGNOSIS — I129 Hypertensive chronic kidney disease with stage 1 through stage 4 chronic kidney disease, or unspecified chronic kidney disease: Secondary | ICD-10-CM | POA: Diagnosis present

## 2022-12-01 DIAGNOSIS — Z79899 Other long term (current) drug therapy: Secondary | ICD-10-CM | POA: Diagnosis not present

## 2022-12-01 DIAGNOSIS — I2489 Other forms of acute ischemic heart disease: Secondary | ICD-10-CM | POA: Diagnosis not present

## 2022-12-01 DIAGNOSIS — E7800 Pure hypercholesterolemia, unspecified: Secondary | ICD-10-CM | POA: Diagnosis present

## 2022-12-01 DIAGNOSIS — Z833 Family history of diabetes mellitus: Secondary | ICD-10-CM

## 2022-12-01 DIAGNOSIS — Z955 Presence of coronary angioplasty implant and graft: Secondary | ICD-10-CM | POA: Diagnosis not present

## 2022-12-01 DIAGNOSIS — Z7682 Awaiting organ transplant status: Secondary | ICD-10-CM

## 2022-12-01 DIAGNOSIS — I249 Acute ischemic heart disease, unspecified: Principal | ICD-10-CM

## 2022-12-01 DIAGNOSIS — R079 Chest pain, unspecified: Secondary | ICD-10-CM | POA: Diagnosis not present

## 2022-12-01 DIAGNOSIS — N183 Chronic kidney disease, stage 3 unspecified: Secondary | ICD-10-CM | POA: Diagnosis present

## 2022-12-01 DIAGNOSIS — I10 Essential (primary) hypertension: Secondary | ICD-10-CM | POA: Diagnosis present

## 2022-12-01 DIAGNOSIS — Z888 Allergy status to other drugs, medicaments and biological substances status: Secondary | ICD-10-CM

## 2022-12-01 LAB — CBC
HCT: 24.4 % — ABNORMAL LOW (ref 39.0–52.0)
Hemoglobin: 8.9 g/dL — ABNORMAL LOW (ref 13.0–17.0)
MCH: 30.6 pg (ref 26.0–34.0)
MCHC: 36.5 g/dL — ABNORMAL HIGH (ref 30.0–36.0)
MCV: 83.8 fL (ref 80.0–100.0)
Platelets: 302 10*3/uL (ref 150–400)
RBC: 2.91 MIL/uL — ABNORMAL LOW (ref 4.22–5.81)
RDW: 17.2 % — ABNORMAL HIGH (ref 11.5–15.5)
WBC: 14.4 10*3/uL — ABNORMAL HIGH (ref 4.0–10.5)
nRBC: 0.3 % — ABNORMAL HIGH (ref 0.0–0.2)

## 2022-12-01 LAB — HEPATIC FUNCTION PANEL
ALT: 17 U/L (ref 0–44)
AST: 12 U/L — ABNORMAL LOW (ref 15–41)
Albumin: 3.2 g/dL — ABNORMAL LOW (ref 3.5–5.0)
Alkaline Phosphatase: 48 U/L (ref 38–126)
Bilirubin, Direct: 0.1 mg/dL (ref 0.0–0.2)
Indirect Bilirubin: 0.7 mg/dL (ref 0.3–0.9)
Total Bilirubin: 0.8 mg/dL (ref 0.3–1.2)
Total Protein: 6.2 g/dL — ABNORMAL LOW (ref 6.5–8.1)

## 2022-12-01 LAB — BASIC METABOLIC PANEL
Anion gap: 16 — ABNORMAL HIGH (ref 5–15)
BUN: 56 mg/dL — ABNORMAL HIGH (ref 6–20)
CO2: 22 mmol/L (ref 22–32)
Calcium: 8.7 mg/dL — ABNORMAL LOW (ref 8.9–10.3)
Chloride: 104 mmol/L (ref 98–111)
Creatinine, Ser: 4.18 mg/dL — ABNORMAL HIGH (ref 0.61–1.24)
GFR, Estimated: 16 mL/min — ABNORMAL LOW (ref >60)
Glucose, Bld: 98 mg/dL (ref 70–99)
Potassium: 3.8 mmol/L (ref 3.5–5.1)
Sodium: 142 mmol/L (ref 135–145)

## 2022-12-01 LAB — LIPASE, BLOOD: Lipase: 45 U/L (ref 11–51)

## 2022-12-01 LAB — TROPONIN I (HIGH SENSITIVITY)
Troponin I (High Sensitivity): 72 ng/L — ABNORMAL HIGH (ref <18)
Troponin I (High Sensitivity): 139 ng/L (ref <18)

## 2022-12-01 MED ORDER — ASPIRIN 81 MG PO CHEW
324.0000 mg | CHEWABLE_TABLET | ORAL | Status: AC
  Filled 2022-12-01: qty 4

## 2022-12-01 MED ORDER — HEPARIN SODIUM (PORCINE) 5000 UNIT/ML IJ SOLN: 4000.0000 [IU] | Freq: Once | INTRAMUSCULAR | Status: DC

## 2022-12-01 MED ORDER — ASPIRIN 300 MG RE SUPP: 300.0000 mg | RECTAL | Status: AC

## 2022-12-01 MED ORDER — ACETAMINOPHEN 500 MG PO TABS
1000.0000 mg | ORAL_TABLET | Freq: Once | ORAL | Status: AC
  Administered 2022-12-01: 1000 mg via ORAL
  Filled 2022-12-01: qty 2

## 2022-12-01 MED ORDER — ACETAMINOPHEN 325 MG PO TABS
650.0000 mg | ORAL_TABLET | ORAL | Status: DC | PRN
  Administered 2022-12-02 – 2022-12-04 (×2): 650 mg via ORAL
  Filled 2022-12-01 (×2): qty 2

## 2022-12-01 MED ORDER — ROSUVASTATIN CALCIUM 20 MG PO TABS
20.0000 mg | ORAL_TABLET | Freq: Every day | ORAL | Status: DC
  Administered 2022-12-02 – 2022-12-04 (×2): 20 mg via ORAL
  Filled 2022-12-01 (×4): qty 1

## 2022-12-01 MED ORDER — HEPARIN BOLUS VIA INFUSION
4000.0000 [IU] | Freq: Once | INTRAVENOUS | Status: AC
  Administered 2022-12-01: 4000 [IU] via INTRAVENOUS
  Filled 2022-12-01: qty 4000

## 2022-12-01 MED ORDER — AMLODIPINE BESYLATE 10 MG PO TABS
10.0000 mg | ORAL_TABLET | Freq: Every day | ORAL | Status: DC
  Administered 2022-12-02 – 2022-12-04 (×3): 10 mg via ORAL
  Filled 2022-12-01 (×3): qty 1

## 2022-12-01 MED ORDER — ASPIRIN 81 MG PO TBEC
81.0000 mg | DELAYED_RELEASE_TABLET | Freq: Every day | ORAL | Status: DC
  Administered 2022-12-02 – 2022-12-04 (×3): 81 mg via ORAL
  Filled 2022-12-01 (×3): qty 1

## 2022-12-01 MED ORDER — SODIUM BICARBONATE 650 MG PO TABS
1300.0000 mg | ORAL_TABLET | Freq: Two times a day (BID) | ORAL | Status: DC
  Administered 2022-12-02 – 2022-12-04 (×5): 1300 mg via ORAL
  Filled 2022-12-01 (×5): qty 2

## 2022-12-01 MED ORDER — HYDRALAZINE HCL 50 MG PO TABS
100.0000 mg | ORAL_TABLET | Freq: Two times a day (BID) | ORAL | Status: DC
  Filled 2022-12-01: qty 2

## 2022-12-01 MED ORDER — NITROGLYCERIN 2 % TD OINT
1.0000 [in_us] | TOPICAL_OINTMENT | Freq: Four times a day (QID) | TRANSDERMAL | Status: DC
  Administered 2022-12-01 – 2022-12-04 (×9): 1 [in_us] via TOPICAL
  Filled 2022-12-01 (×9): qty 1

## 2022-12-01 MED ORDER — CARVEDILOL 6.25 MG PO TABS
6.2500 mg | ORAL_TABLET | Freq: Two times a day (BID) | ORAL | Status: DC
  Administered 2022-12-02 – 2022-12-04 (×5): 6.25 mg via ORAL
  Filled 2022-12-01 (×5): qty 1

## 2022-12-01 MED ORDER — GABAPENTIN 300 MG PO CAPS
300.0000 mg | ORAL_CAPSULE | Freq: Every day | ORAL | Status: DC
  Administered 2022-12-02 – 2022-12-04 (×2): 300 mg via ORAL
  Filled 2022-12-01 (×2): qty 1

## 2022-12-01 MED ORDER — NITROGLYCERIN 0.4 MG SL SUBL: 0.4000 mg | SUBLINGUAL_TABLET | SUBLINGUAL | Status: DC | PRN

## 2022-12-01 MED ORDER — CALCITRIOL 0.25 MCG PO CAPS
0.2500 ug | ORAL_CAPSULE | Freq: Every evening | ORAL | Status: DC
  Administered 2022-12-02 – 2022-12-03 (×2): 0.25 ug via ORAL
  Filled 2022-12-01 (×2): qty 1

## 2022-12-01 MED ORDER — HYDRALAZINE HCL 50 MG PO TABS: 100.0000 mg | ORAL_TABLET | Freq: Two times a day (BID) | ORAL | Status: DC

## 2022-12-01 MED ORDER — ONDANSETRON HCL 4 MG/2ML IJ SOLN: 4.0000 mg | Freq: Four times a day (QID) | INTRAMUSCULAR | Status: DC | PRN

## 2022-12-01 MED ORDER — SODIUM CHLORIDE 0.9 % IV SOLN: INTRAVENOUS | Status: DC

## 2022-12-01 MED ORDER — HEPARIN (PORCINE) 25000 UT/250ML-% IV SOLN
1700.0000 [IU]/h | INTRAVENOUS | Status: DC
  Administered 2022-12-01: 1200 [IU]/h via INTRAVENOUS
  Administered 2022-12-02: 1700 [IU]/h via INTRAVENOUS
  Filled 2022-12-01 (×3): qty 250

## 2022-12-01 MED ORDER — CLOPIDOGREL BISULFATE 75 MG PO TABS: 75.0000 mg | ORAL_TABLET | Freq: Every day | ORAL | Status: DC

## 2022-12-01 NOTE — ED Triage Notes (Signed)
Pt reports chest pain that started at 1230 today. Pt reports taking 1 nitroglycerin at home with relief. Pt reporting no chest pain at this time. No nausea or SHOB.

## 2022-12-01 NOTE — H&P (Signed)
Cardiology Admission History and Physical   Patient ID: George Erickson MRN: [id number]; DOB: 11/12/1970   Admission date: 12/01/2022  PCP:  Shon Hale, MD   [facility] Providers Cardiologist:  Bryan Lemma, MD        Chief Complaint: Chest pain  Patient Profile:   George Erickson is a 52 y.o. male with coronary artery disease with prior PCI of LCx/OM, HLD, HTN, CKD, OSA who is being seen 12/01/2022 for the evaluation of chest pain.  History of Present Illness:   George Erickson presented to the emergency department after developing chest pain earlier in the day.  Noted the chest pain was pressure-like and sharp on his left side.  He is also getting some tingling and heaviness in his arm as well.  Notes that it feels similar to a heart attack prior.  He was brought into the emergency department by EMS.  He was loaded with aspirin on arrival.  Upon arrival he was chest pain-free after receiving aspirin and nitroglycerin from EMS.  He did not have any shortness of breath.  Initial ECG unremarkable. Slight elevation of his creatinine from his prior of 3.8.  On arrival today his creatinine was 4.18.  His hemoglobin is 8.9.  Initial troponin was 72 and repeat was 139.  Thus cardiology was called for admission for management of NSTEMI.  Past Medical History:  Diagnosis Date   Allergic rhinitis due to pollen    Arthritis    lower back   CAD S/P DES PCI D1 & OM3 11/15/2009   a) NSTEMI - 100% D1 (DES PCI - Promus DES 2.25 x 23 -- 2.3 mm); otherwise minimal CAD in a co-dominant system;; b) 02/06/20: OM3 80% (DES PCI), D1 70% ISR, mRCA 60-65%.    Chronic left shoulder pain    Impingement syndrome   Erectile dysfunction    Gout    Hyperlipidemia with target LDL less than 70    Associated CAD   Hypertension    Hypertensive kidney disease with CKD stage IV (HCC)    Cr as of August 2021 1.92   Non-ST elevation (NSTEMI) myocardial infarction (HCC) 11/2009; 01/2020   a)  9/'11: Cath - 100% D1 --> DES PCI Promus 2.25 x 23 (2.3 mm).  EF 55% with anterolateral HK. -> patent stent in Sept 2012 & 2015; b) 02/05/2020: NSTEMI - Echo EF >55% no RWMA - 80% OM3 (DES PCI), 70% D1 ISR, 60-65% mRCA.   Obesity (BMI 30.0-34.9)    OSA on CPAP    uses cpap   Vertigo     Past Surgical History:  Procedure Laterality Date   CORONARY STENT INTERVENTION  02/06/2020   [facility] [place], [place] -> Dr. Jeanne Ivan): NSTEMI: OM3 80% -> DES PCI: Synergy DES 2.5 x 20 - > 2.6 mm   CORONARY STENT PLACEMENT  11/15/2009   (Dr. Excell Seltzer) -- DES PCI of 100% D1 - Promus DES 2.25 x 23 --> 2.3 mm   HERNIA REPAIR     hernia repair at birth   LEFT HEART CATH AND CORONARY ANGIOGRAPHY  11/15/2009   (Dr. Excell Seltzer): NSTEMI -- LM-< LAD & LCx. ~mLAD ~30-40% just past D1 (D1 - 100% - CULPRIT - PCI), LCx normal - 2 OM & LPL. Dom RCA with Large PDA (small PL), ~30% mRCA.  EF ~55% with Ant-Lat HK    LEFT HEART CATH AND CORONARY ANGIOGRAPHY  11/22/2010   (Dr. Donnie Aho): Patent Diag 1 stent with  minimal CAD elsewhere.  EF ~55-60% with Anterolateral HK   LEFT HEART CATH AND CORONARY ANGIOGRAPHY  12/31/2013   [facility] - for Abnormal Myoview) --> Non-obstructive CAD with widely patent D1 stent.  LVEDP ~16 mmHg   LEFT HEART CATH AND CORONARY ANGIOGRAPHY  02/06/2020   [facility] [place], [place] -> Dr. Mellody Drown): NSTEMI: D1 70% mid stent ISR; OM3 80% (-> DES PCI), mRCA 60-65%.     LUMBAR LAMINECTOMY/ DECOMPRESSION WITH MET-RX N/A 01/07/2021   Procedure: Minimally Invasive Microdiscectomy right Lumbar four-Five, Left Lumbar five-Sacral one;  Surgeon: Dawley, Alan Mulder, DO;  Location: [facility] OR;  Service: Neurosurgery;  Laterality: N/A;   NASAL SINUS SURGERY  2010   For sleep apnea   NM MYOVIEW LTD  12/05/2019   EF> 65%.  Fixed anterior perfusion defect with normal motion suggesting artifact.  Read as LOW RISK -> but NOT NORMAL   TM MYOVIEW  12/2013   (FALSE POSITIVE) + TM - lateral ST  depressions (also noted with recovery - near syncope) -> Reversible Anterior Wall Perfusion defect - CRO Ischemia; Partialy reversible Inferior defect - ~ diaphragmatic attenuation. EF ~48%. --> Referred for CATH --> FALSE POSTIIVE   TRANSTHORACIC ECHOCARDIOGRAM  07/08/2014   EF 60%. No RWMA. Mild LA dilation. Normal walves.   TRANSTHORACIC ECHOCARDIOGRAM  02/05/2020   [facility] [place], [place] -> Dr. Mellody Drown): NSTEMI: EF> 55%. No R WMA. Normal valves.     Medications Prior to Admission: Prior to Admission medications   Medication Sig Start Date End Date Taking? Authorizing Provider  amLODipine (NORVASC) 10 MG tablet Take 1 tablet by mouth daily.   Yes [provider]  calcitRIOL (ROCALTROL) 0.25 MCG capsule Take 0.25 mcg by mouth every evening. 11/28/21  Yes [provider]  carvedilol (COREG) 6.25 MG tablet Take 1 tablet by mouth twice daily 06/27/22  Yes Marykay Lex, MD  clopidogrel (PLAVIX) 75 MG tablet TAKE 1 TABLET BY MOUTH ONCE DAILY START  AFTER  YOU  COMPLETE  BRILINTA 05/26/22  Yes Marykay Lex, MD  diazepam (VALIUM) 2 MG tablet Take 1 tablet (2 mg total) by mouth every 6 (six) hours as needed for muscle spasms. 11/21/22  Yes Ali, Amjad, PA-C  gabapentin (NEURONTIN) 300 MG capsule Take 300 mg by mouth at bedtime.   Yes [provider]  hydrALAZINE (APRESOLINE) 100 MG tablet Take 100 mg by mouth 2 (two) times daily.   Yes [provider]  lidocaine (LIDODERM) 5 % Place 1 patch onto the skin daily. Remove & Discard patch within 12 hours or as directed by MD 11/25/22  Yes Claretha Cooper, DO  nitroGLYCERIN (NITROSTAT) 0.4 MG SL tablet Place 1 tablet (0.4 mg total) under the tongue every 5 (five) minutes as needed for chest pain. 01/23/20  Yes Marykay Lex, MD  nitroGLYCERIN (NITROSTAT) 0.4 MG SL tablet See admin instructions. 08/19/20  Yes [provider]  oxyCODONE (ROXICODONE) 5 MG immediate release tablet Take 1  tablet (5 mg total) by mouth every 4 (four) hours as needed for severe pain. 11/21/22  Yes Ali, Amjad, PA-C  pravastatin (PRAVACHOL) 40 MG tablet Take 1 tablet (40 mg total) by mouth every evening. 10/20/22  Yes Marykay Lex, MD  PROAIR RESPICLICK 108 [phone] Base) MCG/ACT AEPB Inhale 2 puffs into the lungs every 4 (four) hours as needed (SOB/Wheezing). 01/08/20  Yes [provider]  sennosides-docusate sodium (SENOKOT-S) 8.6-50 MG tablet Take 1 tablet by mouth daily as  needed for constipation.   Yes [provider]  sodium bicarbonate 650 MG tablet Take 1,300 mg by mouth 2 (two) times daily. 11/23/21  Yes [provider]  vitamin C (ASCORBIC ACID) 500 MG tablet Take 500 mg by mouth daily.   Yes [provider]  potassium chloride SA (KLOR-CON M) 20 MEQ tablet Take 2 tablets (40 mEq total) by mouth daily for 5 days. 11/21/22 11/26/22  Marita Kansas, PA-C     Allergies:    Allergies  Allergen Reactions   Niacin And Related Itching   Zocor [Simvastatin-High Dose]     Muscle aches    Social History:   Social History   Socioeconomic History   Marital status: Married    Spouse name: Not on file   Number of children: 2   Years of education: Not on file   Highest education level: Bachelor's degree (e.g., BA, AB, BS)  Occupational History   Occupation: Comptroller    Employer: [employer]  Tobacco Use   Smoking status: Never   Smokeless tobacco: Never  Vaping Use   Vaping status: Never Used  Substance and Sexual Activity   Alcohol use: Yes    Alcohol/week: 1.0 standard drink of alcohol    Types: 1 Standard drinks or equivalent per week    Comment: Rare, Occasional glass of wine   Drug use: No   Sexual activity: Not on file  Other Topics Concern   Not on file  Social History Narrative   He is a married father of 2 girls.   Sales executive   Lifelong non-smoker.   Rare glass of wine   Minimal caffeine intake,  no longer drinks coffee only drinks intermittent sweet tea.  Rarely would have a Sprite.      Walks daily at work, does not get routine exercise.   Social Determinants of Health   Financial Resource Strain: Not on file  Food Insecurity: Not on file  Transportation Needs: Not on file  Physical Activity: Not on file  Stress: Not on file  Social Connections: Not on file  Intimate Partner Violence: Not on file    Family History:   The patient's family history includes CVA in his sister; CVA (age of onset: 38) in his father; Coronary artery disease (age of onset: 76) in his mother; Diabetes Mellitus II in his father and mother; Healthy in his brother and brother; Heart attack in his sister; Heart attack (age of onset: 45) in his father; Heart failure in his father; Hyperlipidemia in his father and mother; Hypertension in his father, mother, sister, and sister; Hypothyroidism in his sister. There is no history of Colon cancer, Rectal cancer, Stomach cancer, or Esophageal cancer.    ROS:  Please see the history of present illness.  All other ROS reviewed and negative.     Physical Exam/Data:   Vitals:   12/01/22 1910 12/01/22 2000 12/01/22 2030 12/01/22 2200  BP:  (!) 145/78 135/71 130/70  Pulse:  62 65 (!) 57  Resp:  15 16 15   Temp: 98.9 F (37.2 C)     TempSrc: Oral     SpO2:  97% 98% 100%  Weight:      Height:       No intake or output data in the 24 hours ending 12/01/22 2224    12/01/2022    6:22 PM 11/25/2022    5:01 PM 11/21/2022   10:03 AM  Last 3 Weights  Weight (lbs)  240 lb 1.3 oz 240 lb 230 lb  Weight (kg) 108.9 kg 108.863 kg 104.327 kg     Body mass index is 32.56 kg/m.  General:  Well nourished, well developed, in no acute distress*** HEENT: normal Neck: no*** JVD Vascular: No carotid bruits; Distal pulses 2+ bilaterally   Cardiac:  normal S1, S2; RRR; no murmur *** Lungs:  clear to auscultation bilaterally, no wheezing, rhonchi or rales  Abd: soft, nontender,  no hepatomegaly  Ext: no*** edema Musculoskeletal:  No deformities, BUE and BLE strength normal and equal Skin: warm and dry  Neuro:  CNs 2-12 intact, no focal abnormalities noted Psych:  Normal affect    EKG:  The ECG that was done in the emergency department was personally reviewed and demonstrates normal sinus rhythm with nonspecific ST changes  Relevant CV Studies: None  Laboratory Data:  High Sensitivity Troponin:   Recent Labs  Lab 12/01/22 1506 12/01/22 1824  TROPONINIHS 72* 139*      Chemistry Recent Labs  Lab 11/25/22 1332 12/01/22 1506  NA 138 142  K 4.5 3.8  CL 105 104  CO2 21* 22  GLUCOSE 170* 98  BUN 63* 56*  CREATININE 3.80* 4.18*  CALCIUM 8.8* 8.7*  GFRNONAA 18* 16*  ANIONGAP 12 16*    Recent Labs  Lab 12/01/22 1824  PROT 6.2*  ALBUMIN 3.2*  AST 12*  ALT 17  ALKPHOS 48  BILITOT 0.8   Lipids No results for input(s): "CHOL", "TRIG", "HDL", "LABVLDL", "LDLCALC", "CHOLHDL" in the last 168 hours. Hematology Recent Labs  Lab 11/25/22 1332 12/01/22 1506  WBC 21.6* 14.4*  RBC 3.14* 2.91*  HGB 9.6* 8.9*  HCT 26.1* 24.4*  MCV 83.1 83.8  MCH 30.6 30.6  MCHC 36.8* 36.5*  RDW 17.8* 17.2*  PLT 365 302   Thyroid No results for input(s): "TSH", "FREET4" in the last 168 hours. BNPNo results for input(s): "BNP", "PROBNP" in the last 168 hours.  DDimer No results for input(s): "DDIMER" in the last 168 hours.   Radiology/Studies:  DG Chest 2 View  Result Date: 12/01/2022 CLINICAL DATA:  Chest Pain EXAM: CHEST - 2 VIEW COMPARISON:  None Available. FINDINGS: The heart size and mediastinal contours are within normal limits. No consolidation, pneumothorax or effusion. No edema. The visualized skeletal structures are unremarkable. IMPRESSION: No acute cardiopulmonary disease. Electronically Signed   By: Karen Kays M.D.   On: 12/01/2022 17:54     Assessment and Plan:   NSTEMI   Plan: - npo after midnight for lhc tomorrow - echo ordered for  tomorrow - heparin for anticoagulation - loaded 325 mg ASA; continue 81 mg daily  - hold off on p2y12i until cath - start high intensity statin with *** ***mg; check lipid panel and LP(a) - will start on *** *** mg for beta blocker therapy - plan to start ACEi/ARB post cath - will check CBC, CMP, INR, hemoglobin A1c, tsh/FT4 - referral for cardiac rehab - admit for cardiac tele; strict I&Os; daily weights  - sublingual NTG PRN for pain - can escalate to nitro gtt if needed   Chronic kidney disease Hypertension Hyperlipidemia   Risk Assessment/Risk Scores:    TIMI Risk Score for Unstable Angina or Non-ST Elevation MI:   The patient's TIMI risk score is 5, which indicates a 26% risk of all cause mortality, new or recurrent myocardial infarction or need for urgent revascularization in the next 14 days.      Code Status: Full Code  Severity  of Illness: The appropriate patient status for this patient is INPATIENT. Inpatient status is judged to be reasonable and necessary in order to provide the required intensity of service to ensure the patient's safety. The patient's presenting symptoms, physical exam findings, and initial radiographic and laboratory data in the context of their chronic comorbidities is felt to place them at high risk for further clinical deterioration. Furthermore, it is not anticipated that the patient will be medically stable for discharge from the hospital within 2 midnights of admission.   * I certify that at the point of admission it is my clinical judgment that the patient will require inpatient hospital care spanning beyond 2 midnights from the point of admission due to high intensity of service, high risk for further deterioration and high frequency of surveillance required.*   For questions or updates, please contact [facility] Please consult [email or web address] for contact info under     Signed, Joellen Jersey, MD  12/01/2022 10:24 PM

## 2022-12-01 NOTE — Progress Notes (Signed)
ANTICOAGULATION CONSULT NOTE - Initial Consult  Pharmacy Consult for Heparin Indication: chest pain/ACS  Allergies  Allergen Reactions   Niacin And Related Itching   Zocor [Simvastatin-High Dose]     Muscle aches    Patient Measurements: Height: 6' (182.9 cm) Weight: 108.9 kg (240 lb 1.3 oz) IBW/kg (Calculated) : 77.6 Heparin Dosing Weight: 100 kg  Vital Signs: Temp: 99.2 F (37.3 C) (09/20 1427) BP: 153/81 (09/20 1427) Pulse Rate: 93 (09/20 1427)  Labs: Recent Labs    12/01/22 1506  HGB 8.9*  HCT 24.4*  PLT 302  CREATININE 4.18*  TROPONINIHS 72*    Estimated Creatinine Clearance: 26.3 mL/min (A) (by C-G formula based on SCr of 4.18 mg/dL (H)).   Medical History: Past Medical History:  Diagnosis Date   Allergic rhinitis due to pollen    Arthritis    lower back   CAD S/P DES PCI D1 & OM3 11/15/2009   a) NSTEMI - 100% D1 (DES PCI - Promus DES 2.25 x 23 -- 2.3 mm); otherwise minimal CAD in a co-dominant system;; b) 02/06/20: OM3 80% (DES PCI), D1 70% ISR, mRCA 60-65%.    Chronic left shoulder pain    Impingement syndrome   Erectile dysfunction    Gout    Hyperlipidemia with target LDL less than 70    Associated CAD   Hypertension    Hypertensive kidney disease with CKD stage IV (HCC)    Cr as of August 2021 1.92   Non-ST elevation (NSTEMI) myocardial infarction (HCC) 11/2009; 01/2020   a) 9/'11: Cath - 100% D1 --> DES PCI Promus 2.25 x 23 (2.3 mm).  EF 55% with anterolateral HK. -> patent stent in Sept 2012 & 2015; b) 02/05/2020: NSTEMI - Echo EF >55% no RWMA - 80% OM3 (DES PCI), 70% D1 ISR, 60-65% mRCA.   Obesity (BMI 30.0-34.9)    OSA on CPAP    uses cpap   Vertigo     Medications:  (Not in a hospital admission)  Scheduled:   acetaminophen  1,000 mg Oral Once   heparin  4,000 Units Intravenous Once   nitroGLYCERIN  1 inch Topical Q6H   Infusions:   sodium chloride     heparin     PRN:   Assessment: 52 yom with a history of NSTEMI 2021 w/  cath and PCI, HTN. Patient is presenting with chest pain. Heparin per pharmacy consult placed for chest pain/ACS.  Patient is not on anticoagulation prior to arrival.  Hgb 8.9 baseline; plt 302  Goal of Therapy:  Heparin level 0.3-0.7 units/ml Monitor platelets by anticoagulation protocol: Yes   Plan:  Give IV heparin 4000 units bolus x 1 Start heparin infusion at 1200 units/hr Check anti-Xa level in 8 hours and daily while on heparin Continue to monitor H&H and platelets  Delmar Landau, PharmD, BCPS 12/01/2022 6:23 PM ED Clinical Pharmacist -  [phone]

## 2022-12-01 NOTE — ED Provider Notes (Addendum)
[place] [facility]ERGENC[facility] DEPARTMENT AT [facility] Provider Note   CSN: [id number] Arrival date & time: 12/01/22  1425     History  Chief Complaint  Patient presents with   Chest Pain    George Erickson is a 52 y.o. male.  HPI Patient with history of NSTEMI 2021 with cardiac catheterization and PCI.  History of hypercholesterolemia and hypertension.  Patient reports that he developed chest pain at about 1230.  The pain was pressure-like and sharp on the left side of his chest about breast level.  He reports he thought maybe it was musculoskeletal or indigestion but then he started getting tingling and uncomfortable in his arm as well.  Denies that he got nausea or shortness of breath.  He reports that the feeling was similar to when he had his heart attack a couple years ago.  He reports he thought he would try to lie down to rest and see if it got better.  It was not improving after about 20 minutes.  He called his wife to let her know that he was going to call 911.  The patient reports he had an expired nitroglycerin which he took.  He reports it did start to alleviate the pain and by the time EMS got there the pain was abating quite a bit.  He reports he got the characteristic headache after he took the nitroglycerin so he thought it was probably working.  Once EMS arrived they also gave him a dose of aspirin.  Patient reports at this time he is now pain-free.  He does not have any shortness of breath, arm pain or lightheadedness currently.  The patient has been instructed to hold his Plavix for a couple of days because he is scheduled to get a retinal surgery within the next few days.  He had developed a retinal tear that got repaired and improved his vision significantly but now over the past few days or weeks he has had loss of vision again and it was determined that he needed a another retinal surgery to try to recapture vision in the left eye.  Also, patient reports incidentally he  is getting a gout flare in his left great toe.  Does not drink alcohol or smoke cigarettes.    Home Medications Prior to Admission medications   Medication Sig Start Date End Date Taking? Authorizing Provider  amLODipine (NORVASC) 10 MG tablet Take 1 tablet by mouth daily.    [provider]  calcitRIOL (ROCALTROL) 0.25 MCG capsule Take 0.25 mcg by mouth daily. 11/28/21   [provider]  carvedilol (COREG) 6.25 MG tablet Take 1 tablet by mouth twice daily 06/27/22   Marykay Lex, MD  clopidogrel (PLAVIX) 75 MG tablet TAKE 1 TABLET BY MOUTH ONCE DAILY START  AFTER  YOU  COMPLETE  BRILINTA 05/26/22   Marykay Lex, MD  diazepam (VALIUM) 2 MG tablet Take 1 tablet (2 mg total) by mouth every 6 (six) hours as needed for muscle spasms. 11/21/22   Marita Kansas, PA-C  epoetin alfa (PROCRIT) 40981 UNIT/ML injection  10/11/21   [provider]  hydrALAZINE (APRESOLINE) 100 MG tablet Take 100 mg by mouth 2 (two) times daily.    [provider]  lidocaine (LIDODERM) 5 % Place 1 patch onto the skin daily. Remove & Discard patch within 12 hours or as directed by MD 11/25/22   Claretha Cooper, DO  nitroGLYCERIN (NITROSTAT) 0.4 MG SL tablet Place 1 tablet (0.4 mg  total) under the tongue every 5 (five) minutes as needed for chest pain. 01/23/20   Marykay Lex, MD  nitroGLYCERIN (NITROSTAT) 0.4 MG SL tablet See admin instructions. 08/19/20   [provider]  oxyCODONE (ROXICODONE) 5 MG immediate release tablet Take 1 tablet (5 mg total) by mouth every 4 (four) hours as needed for severe pain. 11/21/22   Karie Mainland, Amjad, PA-C  potassium chloride SA (KLOR-CON M) 20 MEQ tablet Take 2 tablets (40 mEq total) by mouth daily for 5 days. 11/21/22 11/26/22  Marita Kansas, PA-C  pravastatin (PRAVACHOL) 40 MG tablet Take 1 tablet (40 mg total) by mouth every evening. 10/20/22   Marykay Lex, MD  PROAIR RESPICLICK 108 [phone] Base) MCG/ACT AEPB Inhale 2 puffs into the lungs every 4 (four)  hours as needed (SOB/Wheezing). 01/08/20   [provider]  sennosides-docusate sodium (SENOKOT-S) 8.6-50 MG tablet Take 1 tablet by mouth daily as needed for constipation.    [provider]  sodium bicarbonate 650 MG tablet Take 1,300 mg by mouth 2 (two) times daily. 11/23/21   [provider]  vitamin C (ASCORBIC ACID) 500 MG tablet Take 500 mg by mouth daily.    [provider]      Allergies    Niacin and related and Zocor [simvastatin-high dose]    Review of Systems   Review of Systems  Physical Exam Updated Vital Signs BP (!) 153/81 (BP Location: Right Arm)   Pulse 93   Temp 99.2 F (37.3 C)   Resp 18   SpO2 95%  Physical Exam Constitutional:      Comments: Well-nourished well-developed.  Alert nontoxic.  Mental status is normal.  No respiratory distress.  HENT:     Head: Normocephalic and atraumatic.     Mouth/Throat:     Pharynx: Oropharynx is clear.  Eyes:     Comments: Patient has a patch over the left lens of his glasses.  Extraocular motions intact.  Cardiovascular:     Rate and Rhythm: Normal rate and regular rhythm.  Pulmonary:     Effort: Pulmonary effort is normal.     Breath sounds: Normal breath sounds.  Abdominal:     General: There is no distension.     Palpations: Abdomen is soft.     Tenderness: There is no abdominal tenderness. There is no guarding.  Musculoskeletal:     Comments: Patient has some swelling and erythema of the first metatarsal on the left in the great toe.  He reports he is getting a gout flare.  His lower legs do not have any edema.  Calves are soft and nontender.  Lower legs are normal.  Skin:    General: Skin is warm and dry.  Neurological:     General: No focal deficit present.     Mental Status: He is oriented to person, place, and time.     Motor: No weakness.     Coordination: Coordination normal.     Comments: Speech is clear with normal cognitive function.  All movements are coordinated  purposeful symmetric without difficulty.  Psychiatric:        Mood and Affect: Mood normal.     ED Results / Procedures / Treatments   Labs (all labs ordered are listed, but only abnormal results are displayed) Labs Reviewed  BASIC METABOLIC PANEL - Abnormal; Notable for the following components:      Result Value   BUN 56 (*)    Creatinine, Ser 4.18 (*)  Calcium 8.7 (*)    GFR, Estimated 16 (*)    Anion gap 16 (*)    All other components within normal limits  CBC - Abnormal; Notable for the following components:   WBC 14.4 (*)    RBC 2.91 (*)    Hemoglobin 8.9 (*)    HCT 24.4 (*)    MCHC 36.5 (*)    RDW 17.2 (*)    nRBC 0.3 (*)    All other components within normal limits  TROPONIN I (HIGH SENSITIVITY) - Abnormal; Notable for the following components:   Troponin I (High Sensitivity) 72 (*)    All other components within normal limits  LIPASE, BLOOD  HEPATIC FUNCTION PANEL  TROPONIN I (HIGH SENSITIVITY)    EKG EKG Interpretation Date/Time:  Friday December 01 2022 14:29:44 EDT Ventricular Rate:  94 PR Interval:  168 QRS Duration:  84 QT Interval:  378 QTC Calculation: 472 R Axis:   55  Text Interpretation: Normal sinus rhythm Septal infarct , age undetermined T wave abnormality, consider lateral ischemia Abnormal ECG When compared with ECG of 27-Mar-2020 20:47, PREVIOUS ECG IS PRESENT Since last tracing ST segments in inferior leads are more abnormal.  no ST elevation Confirmed by Eber Hong ([id number]) on 12/01/2022 4:19:46 PM  Radiology DG Chest 2 View  Result Date: 12/01/2022 CLINICAL DATA:  Chest Pain EXAM: CHEST - 2 VIEW COMPARISON:  None Available. FINDINGS: The heart size and mediastinal contours are within normal limits. No consolidation, pneumothorax or effusion. No edema. The visualized skeletal structures are unremarkable. IMPRESSION: No acute cardiopulmonary disease. Electronically Signed   By: Karen Kays M.D.   On: 12/01/2022 17:54     Procedures Procedures   CRITICAL CARE Performed by: Arby Barrette   Total critical care time: 30 minutes  Critical care time was exclusive of separately billable procedures and treating other patients.  Critical care was necessary to treat or prevent imminent or life-threatening deterioration.  Critical care was time spent personally by me on the following activities: development of treatment plan with patient and/or surrogate as well as nursing, discussions with consultants, evaluation of patient's response to treatment, examination of patient, obtaining history from patient or surrogate, ordering and performing treatments and interventions, ordering and review of laboratory studies, ordering and review of radiographic studies, pulse oximetry and re-evaluation of patient's condition.   Consult: Dr. Darrelyn Hillock Cardiology. See if 3 trops are flat. If no elevation, consider D/C on ASA 81mg  until can restart  plavix.  Medications Ordered in ED Medications  nitroGLYCERIN (NITROGLYN) 2 % ointment 1 inch (has no administration in time range)  acetaminophen (TYLENOL) tablet 1,000 mg (has no administration in time range)  0.9 %  sodium chloride infusion (has no administration in time range)  heparin injection 4,000 Units (has no administration in time range)    ED Course/ Medical Decision Making/ A&P                                 Medical Decision Making Amount and/or Complexity of Data Reviewed Labs: ordered. Radiology: ordered.  Risk OTC drugs. Prescription drug management. Decision regarding hospitalization.   Patient presents as outlined.  Review of EMR indicates patient has known coronary artery disease with prior PCI with stent occlusions.  Patient has been off of Plavix for 2 days in preparation for retinal surgery.  This may be the precipitating factor.  The patient's pain was relieved after 1  nitroglycerin and aspirin.  Concern is for ACS.  Within differential diagnosis  is PE\pneumonia\pneumothorax\musculoskeletal pain.  EKG personally reviewed by myself shows inferior ST depressions.  These are new compared to the first previous tracing available.  No STEMI.  Patient has high risk for ACS.  He has had aspirin and nitroglycerin tablet at home.  He is currently pain-free.  Pending his troponin and additional screening lab work.  Given patient's known history plus presentation concerning for angina, will plan for cardiology consultation and likely admission.  Patient has been advised to make staff aware immediately should he have any recurrence of pain.  At this time he is alert with clear mental status no respiratory distress and pain-free.  He voices understanding.  Will continue to observe closely.  Patient's first troponin is 72.  Patient has stage IV kidney disease GFR is 16.  Hemoglobin 8.9.  Hemoglobin is around baseline from 8.7-10.4.  GFR baseline is from 18.  With patient's troponin elevated, inferior ST depression and symptoms concerning for ACS with patient recently holding Plavix, will start a heparin bolus and continue to observe.  Will consult cardiology for admission.  Patient is still pain-free.  Consult: Reviewed with Dr. Cristal Deer cardiology.  She advises that if 3 troponins trend flat, the patient's baseline is around the 70s.  And the patient does not have recurrences of pain or other EKG changes can go home taking 81 mg aspirin with plan of still proceeding with retinal surgery.  If cardiology considered performing catheterization, they would have to discuss initiating dialysis for the patient given his stage IV renal dysfunction.  Not ideal for cardiac catheterization unless emergently needed.  Troponin returned at 139.  This is a double from first troponin at 70.  Will plan for admission.  Heparin is infusing.  Patient remains asymptomatic without active chest pain.  Consult: Reviewed with Dr. Hyacinth Meeker for admission  Final Clinical  Impression(s) / ED Diagnoses Final diagnoses:  ACS (acute coronary syndrome) [facility])    Rx / DC Orders ED Discharge Orders     None         Arby Barrette, MD 12/01/22 Merrily Brittle    Arby Barrette, MD 12/01/22 Berna Spare    Arby Barrette, MD 12/01/22 2116

## 2022-12-02 ENCOUNTER — Inpatient Hospital Stay (HOSPITAL_COMMUNITY): Payer: BC Managed Care – PPO

## 2022-12-02 DIAGNOSIS — I2489 Other forms of acute ischemic heart disease: Secondary | ICD-10-CM | POA: Diagnosis not present

## 2022-12-02 DIAGNOSIS — I214 Non-ST elevation (NSTEMI) myocardial infarction: Secondary | ICD-10-CM | POA: Diagnosis not present

## 2022-12-02 LAB — ECHOCARDIOGRAM COMPLETE
AR max vel: 4.41 cm2
AV Area VTI: 4.73 cm2
AV Area mean vel: 4.35 cm2
AV Mean grad: 7 mmHg
AV Peak grad: 11.3 mmHg
Ao pk vel: 1.68 m/s
Area-P 1/2: 4.17 cm2
Height: 72 in
S' Lateral: 2.7 cm
Weight: 3497.6 oz

## 2022-12-02 LAB — HEPARIN LEVEL (UNFRACTIONATED)
Heparin Unfractionated: 0.18 IU/mL — ABNORMAL LOW (ref 0.30–0.70)
Heparin Unfractionated: 0.26 IU/mL — ABNORMAL LOW (ref 0.30–0.70)

## 2022-12-02 LAB — BASIC METABOLIC PANEL
Anion gap: 12 (ref 5–15)
BUN: 57 mg/dL — ABNORMAL HIGH (ref 6–20)
CO2: 20 mmol/L — ABNORMAL LOW (ref 22–32)
Calcium: 8.6 mg/dL — ABNORMAL LOW (ref 8.9–10.3)
Chloride: 110 mmol/L (ref 98–111)
Creatinine, Ser: 4.14 mg/dL — ABNORMAL HIGH (ref 0.61–1.24)
GFR, Estimated: 16 mL/min — ABNORMAL LOW (ref >60)
Glucose, Bld: 105 mg/dL — ABNORMAL HIGH (ref 70–99)
Potassium: 4.1 mmol/L (ref 3.5–5.1)
Sodium: 142 mmol/L (ref 135–145)

## 2022-12-02 LAB — CBC
HCT: 22.2 % — ABNORMAL LOW (ref 39.0–52.0)
Hemoglobin: 7.9 g/dL — ABNORMAL LOW (ref 13.0–17.0)
MCH: 29 pg (ref 26.0–34.0)
MCHC: 35.6 g/dL (ref 30.0–36.0)
MCV: 81.6 fL (ref 80.0–100.0)
Platelets: 268 10*3/uL (ref 150–400)
RBC: 2.72 MIL/uL — ABNORMAL LOW (ref 4.22–5.81)
RDW: 17.2 % — ABNORMAL HIGH (ref 11.5–15.5)
WBC: 9.5 10*3/uL (ref 4.0–10.5)
nRBC: 0.2 % (ref 0.0–0.2)

## 2022-12-02 LAB — LIPID PANEL
Cholesterol: 109 mg/dL (ref 0–200)
HDL: 43 mg/dL (ref >40)
LDL Cholesterol: 58 mg/dL (ref 0–99)
Total CHOL/HDL Ratio: 2.5 RATIO
Triglycerides: 41 mg/dL (ref <150)
VLDL: 8 mg/dL (ref 0–40)

## 2022-12-02 LAB — TROPONIN I (HIGH SENSITIVITY): Troponin I (High Sensitivity): 32 ng/L — ABNORMAL HIGH (ref <18)

## 2022-12-02 LAB — PROTIME-INR
INR: 1.2 (ref 0.8–1.2)
Prothrombin Time: 15.4 seconds — ABNORMAL HIGH (ref 11.4–15.2)

## 2022-12-02 MED ORDER — HEPARIN BOLUS VIA INFUSION
3000.0000 [IU] | Freq: Once | INTRAVENOUS | Status: AC
  Administered 2022-12-02: 3000 [IU] via INTRAVENOUS
  Filled 2022-12-02: qty 3000

## 2022-12-02 MED ORDER — HYDRALAZINE HCL 50 MG PO TABS
100.0000 mg | ORAL_TABLET | Freq: Three times a day (TID) | ORAL | Status: DC
  Administered 2022-12-02 – 2022-12-04 (×6): 100 mg via ORAL
  Filled 2022-12-02 (×6): qty 2

## 2022-12-02 MED ORDER — HEPARIN BOLUS VIA INFUSION
1500.0000 [IU] | Freq: Once | INTRAVENOUS | Status: AC
  Administered 2022-12-02: 1500 [IU] via INTRAVENOUS
  Filled 2022-12-02: qty 1500

## 2022-12-02 NOTE — Progress Notes (Signed)
Progress Note  Patient Name: George Erickson Date of Encounter: 12/02/2022  Primary Cardiologist: Bryan Lemma, MD   Subjective   Feels well.  No chest pain since admission.  Inpatient Medications    Scheduled Meds:  amLODipine  10 mg Oral Daily   aspirin  324 mg Oral NOW   Or   aspirin  300 mg Rectal NOW   aspirin EC  81 mg Oral Daily   calcitRIOL  0.25 mcg Oral QPM   carvedilol  6.25 mg Oral BID   gabapentin  300 mg Oral QHS   hydrALAZINE  100 mg Oral BID   nitroGLYCERIN  1 inch Topical Q6H   rosuvastatin  20 mg Oral Daily   sodium bicarbonate  1,300 mg Oral BID   Continuous Infusions:  sodium chloride 20 mL/hr at 12/02/22 0307   heparin 1,500 Units/hr (12/02/22 0644)   PRN Meds: acetaminophen, nitroGLYCERIN, ondansetron (ZOFRAN) IV   Vital Signs    Vitals:   12/02/22 0219 12/02/22 0236 12/02/22 0519 12/02/22 0816  BP:  (!) 147/90 130/80 (!) 159/92  Pulse:  (!) 54 67 70  Resp:  17 18 19   Temp:  98 F (36.7 C) 98.5 F (36.9 C) 98.4 F (36.9 C)  TempSrc:  Oral Oral Oral  SpO2:  100% 99% 100%  Weight: 99.2 kg     Height: 6' (1.829 m)       Intake/Output Summary (Last 24 hours) at 12/02/2022 0952 Last data filed at 12/02/2022 0700 Gross per 24 hour  Intake 273.61 ml  Output 1100 ml  Net -826.39 ml   Filed Weights   12/01/22 1822 12/02/22 0219  Weight: 108.9 kg 99.2 kg    Telemetry    Sinus rhythm with occasional PACs- Personally Reviewed  ECG    Sinus rhythm with septal infarct, inferolateral T wave abnormalities - Personally Reviewed  Physical Exam   Gen: Appears comfortable, well-nourished CV: RRR, no dependent edema Pulm: breathing easily   Labs    Chemistry Recent Labs  Lab 11/25/22 1332 12/01/22 1506 12/01/22 1824 12/02/22 0422  NA 138 142  --  142  K 4.5 3.8  --  4.1  CL 105 104  --  110  CO2 21* 22  --  20*  GLUCOSE 170* 98  --  105*  BUN 63* 56*  --  57*  CREATININE 3.80* 4.18*  --  4.14*  CALCIUM 8.8* 8.7*  --   8.6*  PROT  --   --  6.2*  --   ALBUMIN  --   --  3.2*  --   AST  --   --  12*  --   ALT  --   --  17  --   ALKPHOS  --   --  48  --   BILITOT  --   --  0.8  --   GFRNONAA 18* 16*  --  16*  ANIONGAP 12 16*  --  12     Hematology Recent Labs  Lab 11/25/22 1332 12/01/22 1506 12/02/22 0422  WBC 21.6* 14.4* 9.5  RBC 3.14* 2.91* 2.72*  HGB 9.6* 8.9* 7.9*  HCT 26.1* 24.4* 22.2*  MCV 83.1 83.8 81.6  MCH 30.6 30.6 29.0  MCHC 36.8* 36.5* 35.6  RDW 17.8* 17.2* 17.2*  PLT 365 302 268    Cardiac EnzymesNo results for input(s): "TROPONINI" in the last 168 hours. No results for input(s): "TROPIPOC" in the last 168 hours.   BNPNo results for input(s): "  BNP", "PROBNP" in the last 168 hours.   DDimer No results for input(s): "DDIMER" in the last 168 hours.   Summary of Pertinent studies    TTE: ordered  Cardiac cath: 01/2020 -reviewed in epic DES placed to OM 3  Imaging: CXR--no acute process  Labs: Creatinine 4.14, troponin 139  Patient Profile     52 y.o. male  with coronary artery disease with prior PCI of LCx/OM, HLD, HTN, CKD, OSA who is being seen 12/01/2022 for the evaluation of chest pain.   Assessment & Plan    Chest pain Similar in quality to prior MI He was chest pain-free after nitroglycerin and aspirin Lipid panel and LPA pending Continue high intensity statin Continue home carvedilol Trend troponin - if TnI  remains low, will likely Dc with plan for non-invasive test for further risk stratification Echo pending Continue aspirin Plavix held for eye procedure No plan for heart catheter today  CKD Creatinine is 4, GFR 16 Coronary angiogram places him at significant risk for progression to ESRD Continue sodium bicarbonate  Hypertension Continue amlodipine, carvedilol, hydralazine Will adjust hydralazine schedule to 3 times daily, which he takes at home Will consider adding Imdur prior to discharge   Known chronic CAD Continue aspirin 81, continue  carvedilol 6.25 Pravastatin switch to rosuvastatin 20 Plavix held due to anticipated eye surgery on Monday morning    For questions or updates, please contact [facility] Please consult [email or web address] for contact info under Cardiology/STEMI.      Signed, Dylen Small, MD 12/02/2022, 9:52 AM

## 2022-12-02 NOTE — Progress Notes (Signed)
Echocardiogram 2D Echocardiogram has been performed.  Warren Lacy Steel Kerney RDCS 12/02/2022, 3:39 PM

## 2022-12-02 NOTE — ED Notes (Signed)
ED TO INPATIENT HANDOFF REPORT  ED Nurse Name and Phone #: France Ravens [phone]  S Name/Age/Gender George Erickson 52 y.o. male Room/Bed: 010C/010C  Code Status   Code Status: Full Code  Home/SNF/Other Home Patient oriented to: self, place, time, and situation Is this baseline? Yes   Triage Complete: Triage complete  Chief Complaint NSTEMI (non-ST elevated myocardial infarction) [facility]) [I21.4]  Triage Note Pt reports chest pain that started at 1230 today. Pt reports taking 1 nitroglycerin at home with relief. Pt reporting no chest pain at this time. No nausea or SHOB.    Allergies Allergies  Allergen Reactions   Niacin And Related Itching   Zocor [Simvastatin-High Dose]     Muscle aches    Level of Care/Admitting Diagnosis ED Disposition     ED Disposition  Admit   Condition  --   Comment  Hospital Area: [facility] [[id number]]  Level of Care: Telemetry Cardiac [[id number]]  May admit patient to [facility] or [facility] if equivalent level of care is available:: No  Covid Evaluation: Asymptomatic - no recent exposure (last 10 days) testing not required  Diagnosis: NSTEMI (non-ST elevated myocardial infarction) [facility]) [[id number]]  Admitting Physician: Joellen Jersey [phone]  Attending Physician: Antoine Poche [phone]  Certification:: I certify this patient will need inpatient services for at least 2 midnights  Expected Medical Readiness: 12/05/2022          B Medical/Surgery History Past Medical History:  Diagnosis Date   Allergic rhinitis due to pollen    Arthritis    lower back   CAD S/P DES PCI D1 & OM3 11/15/2009   a) NSTEMI - 100% D1 (DES PCI - Promus DES 2.25 x 23 -- 2.3 mm); otherwise minimal CAD in a co-dominant system;; b) 02/06/20: OM3 80% (DES PCI), D1 70% ISR, mRCA 60-65%.    Chronic left shoulder pain    Impingement syndrome   Erectile dysfunction    Gout    Hyperlipidemia with target LDL less than 70    Associated CAD    Hypertension    Hypertensive kidney disease with CKD stage IV (HCC)    Cr as of August 2021 1.92   Non-ST elevation (NSTEMI) myocardial infarction (HCC) 11/2009; 01/2020   a) 9/'11: Cath - 100% D1 --> DES PCI Promus 2.25 x 23 (2.3 mm).  EF 55% with anterolateral HK. -> patent stent in Sept 2012 & 2015; b) 02/05/2020: NSTEMI - Echo EF >55% no RWMA - 80% OM3 (DES PCI), 70% D1 ISR, 60-65% mRCA.   Obesity (BMI 30.0-34.9)    OSA on CPAP    uses cpap   Vertigo    Past Surgical History:  Procedure Laterality Date   CORONARY STENT INTERVENTION  02/06/2020   [facility] [place], [place] -> Dr. Jeanne Ivan): NSTEMI: OM3 80% -> DES PCI: Synergy DES 2.5 x 20 - > 2.6 mm   CORONARY STENT PLACEMENT  11/15/2009   (Dr. Excell Seltzer) -- DES PCI of 100% D1 - Promus DES 2.25 x 23 --> 2.3 mm   HERNIA REPAIR     hernia repair at birth   LEFT HEART CATH AND CORONARY ANGIOGRAPHY  11/15/2009   (Dr. Excell Seltzer): NSTEMI -- LM-< LAD & LCx. ~mLAD ~30-40% just past D1 (D1 - 100% - CULPRIT - PCI), LCx normal - 2 OM & LPL. Dom RCA with Large PDA (small PL), ~30% mRCA.  EF ~55% with Ant-Lat HK    LEFT HEART CATH AND CORONARY  ANGIOGRAPHY  11/22/2010   (Dr. Donnie Aho): Patent Diag 1 stent with minimal CAD elsewhere.  EF ~55-60% with Anterolateral HK   LEFT HEART CATH AND CORONARY ANGIOGRAPHY  12/31/2013   [facility] - for Abnormal Myoview) --> Non-obstructive CAD with widely patent D1 stent.  LVEDP ~16 mmHg   LEFT HEART CATH AND CORONARY ANGIOGRAPHY  02/06/2020   [facility] [place], [place] -> Dr. Mellody Drown): NSTEMI: D1 70% mid stent ISR; OM3 80% (-> DES PCI), mRCA 60-65%.     LUMBAR LAMINECTOMY/ DECOMPRESSION WITH MET-RX N/A 01/07/2021   Procedure: Minimally Invasive Microdiscectomy right Lumbar four-Five, Left Lumbar five-Sacral one;  Surgeon: Dawley, Alan Mulder, DO;  Location: [facility] OR;  Service: Neurosurgery;  Laterality: N/A;   NASAL SINUS SURGERY  2010   For sleep apnea   NM MYOVIEW LTD  12/05/2019   EF>  65%.  Fixed anterior perfusion defect with normal motion suggesting artifact.  Read as LOW RISK -> but NOT NORMAL   TM MYOVIEW  12/2013   (FALSE POSITIVE) + TM - lateral ST depressions (also noted with recovery - near syncope) -> Reversible Anterior Wall Perfusion defect - CRO Ischemia; Partialy reversible Inferior defect - ~ diaphragmatic attenuation. EF ~48%. --> Referred for CATH --> FALSE POSTIIVE   TRANSTHORACIC ECHOCARDIOGRAM  07/08/2014   EF 60%. No RWMA. Mild LA dilation. Normal walves.   TRANSTHORACIC ECHOCARDIOGRAM  02/05/2020   [facility] [place], [place] -> Dr. Mellody Drown): NSTEMI: EF> 55%. No R WMA. Normal valves.     A IV Location/Drains/Wounds Patient Lines/Drains/Airways Status     Active Line/Drains/Airways     Name Placement date Placement time Site Days   Peripheral IV 12/01/22 18 G Left Antecubital 12/01/22  1509  Antecubital  1            Intake/Output Last 24 hours No intake or output data in the 24 hours ending 12/02/22 0042  Labs/Imaging Results for orders placed or performed during the hospital encounter of 12/01/22 (from the past 48 hour(s))  Basic metabolic panel     Status: Abnormal   Collection Time: 12/01/22  3:06 PM  Result Value Ref Range   Sodium 142 135 - 145 mmol/L   Potassium 3.8 3.5 - 5.1 mmol/L   Chloride 104 98 - 111 mmol/L   CO2 22 22 - 32 mmol/L   Glucose, Bld 98 70 - 99 mg/dL    Comment: Glucose reference range applies only to samples taken after fasting for at least 8 hours.   BUN 56 (H) 6 - 20 mg/dL   Creatinine, Ser 6.44 (H) 0.61 - 1.24 mg/dL   Calcium 8.7 (L) 8.9 - 10.3 mg/dL   GFR, Estimated 16 (L) >60 mL/min    Comment: (NOTE) Calculated using the CKD-EPI Creatinine Equation (2021)    Anion gap 16 (H) 5 - 15    Comment: Performed at [facility], [address] [place], [place] [address]  CBC     Status: Abnormal   Collection Time: 12/01/22  3:06 PM  Result Value Ref Range   WBC 14.4 (H) 4.0 - 10.5  K/uL   RBC 2.91 (L) 4.22 - 5.81 MIL/uL   Hemoglobin 8.9 (L) 13.0 - 17.0 g/dL   HCT 25.9 (L) 56.3 - 87.5 %   MCV 83.8 80.0 - 100.0 fL   MCH 30.6 26.0 - 34.0 pg   MCHC 36.5 (H) 30.0 - 36.0 g/dL   RDW 64.3 (H) 32.9 - 51.8 %  Platelets 302 150 - 400 K/uL   nRBC 0.3 (H) 0.0 - 0.2 %    Comment: Performed at [facility], [address], [place], [place] [address]  Troponin I (High Sensitivity)     Status: Abnormal   Collection Time: 12/01/22  3:06 PM  Result Value Ref Range   Troponin I (High Sensitivity) 72 (H) <18 ng/L    Comment: (NOTE) Elevated high sensitivity troponin I (hsTnI) values and significant  changes across serial measurements may suggest ACS but many other  chronic and acute conditions are known to elevate hsTnI results.  Refer to the "Links" section for chest pain algorithms and additional  guidance. Performed at [facility], [address] [place], [place] [address]   Troponin I (High Sensitivity)     Status: Abnormal   Collection Time: 12/01/22  6:24 PM  Result Value Ref Range   Troponin I (High Sensitivity) 139 (HH) <18 ng/L    Comment: CRITICAL RESULT CALLED TO, READ BACK BY AND VERIFIED WITH Shravya Wickwire, H. RN @1927  ON 12/01/22 BY MAB MT (NOTE) Elevated high sensitivity troponin I (hsTnI) values and significant  changes across serial measurements may suggest ACS but many other  chronic and acute conditions are known to elevate hsTnI results.  Refer to the "Links" section for chest pain algorithms and additional  guidance. Performed at [facility], [address] [place], [place] [address]   Lipase, blood     Status: None   Collection Time: 12/01/22  6:24 PM  Result Value Ref Range   Lipase 45 11 - 51 U/L    Comment: Performed at [facility], [address], [place], [place] [address]  Hepatic function panel     Status: Abnormal   Collection Time: 12/01/22  6:24 PM  Result Value Ref Range   Total Protein 6.2 (L) 6.5 - 8.1 g/dL    Albumin 3.2 (L) 3.5 - 5.0 g/dL   AST 12 (L) 15 - 41 U/L   ALT 17 0 - 44 U/L   Alkaline Phosphatase 48 38 - 126 U/L   Total Bilirubin 0.8 0.3 - 1.2 mg/dL   Bilirubin, Direct 0.1 0.0 - 0.2 mg/dL   Indirect Bilirubin 0.7 0.3 - 0.9 mg/dL    Comment: Performed at [facility], [address], [place], [place] [address]   DG Chest 2 View  Result Date: 12/01/2022 CLINICAL DATA:  Chest Pain EXAM: CHEST - 2 VIEW COMPARISON:  None Available. FINDINGS: The heart size and mediastinal contours are within normal limits. No consolidation, pneumothorax or effusion. No edema. The visualized skeletal structures are unremarkable. IMPRESSION: No acute cardiopulmonary disease. Electronically Signed   By: Karen Kays M.D.   On: 12/01/2022 17:54    Pending Labs Unresulted Labs (From admission, onward)     Start     Ordered   12/03/22 0500  Heparin level (unfractionated)  Daily,   R      12/01/22 1822   12/02/22 0500  Lipoprotein A (LPA)  Tomorrow morning,   R        12/01/22 2351   12/02/22 0500  Basic metabolic panel  Tomorrow morning,   R        12/01/22 2351   12/02/22 0500  Lipid panel  Tomorrow morning,   R        12/01/22 2351   12/02/22 0500  CBC  Tomorrow morning,   R  12/01/22 2351   12/02/22 0500  Protime-INR  Tomorrow morning,   R        12/01/22 2351   12/02/22 0300  Heparin level (unfractionated)  Once-Timed,   URGENT        12/01/22 1822            Vitals/Pain Today's Vitals   12/01/22 2153 12/01/22 2200 12/01/22 2330 12/02/22 0009  BP:  130/70 (!) 143/87   Pulse:  (!) 57 (!) 55   Resp:  15 15   Temp:    98.6 F (37 C)  TempSrc:    Oral  SpO2:  100% 100%   Weight:      Height:      PainSc: 0-No pain       Isolation Precautions No active isolations  Medications Medications  nitroGLYCERIN (NITROGLYN) 2 % ointment 1 inch (1 inch Topical Given 12/02/22 0010)  0.9 %  sodium chloride infusion ( Intravenous New Bag/Given 12/01/22 1833)  heparin ADULT infusion  100 units/mL (25000 units/262mL) (1,200 Units/hr Intravenous New Bag/Given 12/01/22 1834)  amLODipine (NORVASC) tablet 10 mg (has no administration in time range)  carvedilol (COREG) tablet 6.25 mg (has no administration in time range)  calcitRIOL (ROCALTROL) capsule 0.25 mcg (has no administration in time range)  sodium bicarbonate tablet 1,300 mg (has no administration in time range)  gabapentin (NEURONTIN) capsule 300 mg (has no administration in time range)  aspirin chewable tablet 324 mg (324 mg Oral Not Given 12/02/22 0010)    Or  aspirin suppository 300 mg ( Rectal See Alternative 12/02/22 0010)  aspirin EC tablet 81 mg (has no administration in time range)  nitroGLYCERIN (NITROSTAT) SL tablet 0.4 mg (has no administration in time range)  acetaminophen (TYLENOL) tablet 650 mg (has no administration in time range)  ondansetron (ZOFRAN) injection 4 mg (has no administration in time range)  rosuvastatin (CRESTOR) tablet 20 mg (has no administration in time range)  hydrALAZINE (APRESOLINE) tablet 100 mg (has no administration in time range)  acetaminophen (TYLENOL) tablet 1,000 mg (1,000 mg Oral Given 12/01/22 1828)  heparin bolus via infusion 4,000 Units (4,000 Units Intravenous Bolus from Bag 12/01/22 1836)    Mobility walks     Focused Assessments Cardiac Assessment Handoff:    Lab Results  Component Value Date   CKTOTAL 764 (H) 11/15/2009   CKMB (HH) 11/15/2009    70.6 CRITICAL RESULT CALLED TO, READ BACK BY AND VERIFIED WITH: C MULLINS,RN 1850 11/15/09 WBOND   TROPONINI <0.03 07/08/2014   Lab Results  Component Value Date   DDIMER 1.96 (H) 07/08/2014   Does the Patient currently have chest pain? No    R Recommendations: See Admitting Provider Note  Report given to:   Additional Notes:

## 2022-12-02 NOTE — Progress Notes (Signed)
ANTICOAGULATION CONSULT NOTE  Pharmacy Consult for Heparin Indication: chest pain/ACS  Allergies  Allergen Reactions   Niacin And Related Itching   Zocor [Simvastatin-High Dose]     Muscle aches    Patient Measurements: Height: 6' (182.9 cm) Weight: 99.2 kg (218 lb 9.6 oz) IBW/kg (Calculated) : 77.6 Heparin Dosing Weight: 97.6 kg  Vital Signs: Temp: 98.5 F (36.9 C) (09/21 1435) Temp Source: Oral (09/21 1435) BP: 138/73 (09/21 1435) Pulse Rate: 83 (09/21 1435)  Labs: Recent Labs    12/01/22 1506 12/01/22 1824 12/02/22 0422 12/02/22 1024 12/02/22 1337  HGB 8.9*  --  7.9*  --   --   HCT 24.4*  --  22.2*  --   --   PLT 302  --  268  --   --   LABPROT  --   --  15.4*  --   --   INR  --   --  1.2  --   --   HEPARINUNFRC  --   --  0.18*  --  0.26*  CREATININE 4.18*  --  4.14*  --   --   TROPONINIHS 72* 139*  --  32*  --     Estimated Creatinine Clearance: 25.4 mL/min (A) (by C-G formula based on SCr of 4.14 mg/dL (H)).   Assessment: 13 yom with a history of NSTEMI 2021 w/ cath and PCI, HTN. Patient is presenting with chest pain. Heparin per pharmacy consult placed for chest pain/ACS.  Patient is not on anticoagulation prior to arrival.  Hgb did drop to 7.9, and PLT slightly dropped to 268. Heparin level is subtherapeutic at 0.26 on 1500 units/hr. No issues with infusion and no bleeding noted.   Goal of Therapy:  Heparin level 0.3-0.7 units/ml Monitor platelets by anticoagulation protocol: Yes   Plan:  Give IV heparin 1500 units bolus x 1 Increase heparin infusion at 1700 units/hr Check anti-Xa level in 8 hours and daily while on heparin Continue to monitor H&H and platelets  Lennie Muckle, PharmD PGY1 Pharmacy Resident 12/02/2022 2:57 PM

## 2022-12-02 NOTE — Progress Notes (Signed)
ANTICOAGULATION CONSULT NOTE - Initial Consult  Pharmacy Consult for Heparin Indication: chest pain/ACS  Allergies  Allergen Reactions   Niacin And Related Itching   Zocor [Simvastatin-High Dose]     Muscle aches    Patient Measurements: Height: 6' (182.9 cm) Weight: 99.2 kg (218 lb 9.6 oz) IBW/kg (Calculated) : 77.6 Heparin Dosing Weight: 97.6 kg  Vital Signs: Temp: 98.5 F (36.9 C) (09/21 0519) Temp Source: Oral (09/21 0519) BP: 130/80 (09/21 0519) Pulse Rate: 67 (09/21 0519)  Labs: Recent Labs    12/01/22 1506 12/01/22 1824 12/02/22 0422  HGB 8.9*  --  7.9*  HCT 24.4*  --  22.2*  PLT 302  --  268  LABPROT  --   --  15.4*  INR  --   --  1.2  HEPARINUNFRC  --   --  0.18*  CREATININE 4.18*  --  4.14*  TROPONINIHS 72* 139*  --     Estimated Creatinine Clearance: 25.4 mL/min (A) (by C-G formula based on SCr of 4.14 mg/dL (H)).   Assessment: 42 yom with a history of NSTEMI 2021 w/ cath and PCI, HTN. Patient is presenting with chest pain. Heparin per pharmacy consult placed for chest pain/ACS.  Patient is not on anticoagulation prior to arrival.  Hgb 8.9 baseline; plt 302  HL 0.18 - subtherapeutic   Goal of Therapy:  Heparin level 0.3-0.7 units/ml Monitor platelets by anticoagulation protocol: Yes   Plan:  Give IV heparin 3000 units bolus x 1 Increase heparin infusion at 1500 units/hr Check anti-Xa level in 8 hours and daily while on heparin Continue to monitor H&H and platelets  Calton Dach, PharmD, BCCCP Clinical Pharmacist 12/02/2022 5:39 AM

## 2022-12-03 ENCOUNTER — Other Ambulatory Visit (HOSPITAL_BASED_OUTPATIENT_CLINIC_OR_DEPARTMENT_OTHER): Payer: Self-pay

## 2022-12-03 DIAGNOSIS — I214 Non-ST elevation (NSTEMI) myocardial infarction: Secondary | ICD-10-CM | POA: Diagnosis not present

## 2022-12-03 LAB — CBC
HCT: 22.7 % — ABNORMAL LOW (ref 39.0–52.0)
Hemoglobin: 8.3 g/dL — ABNORMAL LOW (ref 13.0–17.0)
MCH: 30.2 pg (ref 26.0–34.0)
MCHC: 36.6 g/dL — ABNORMAL HIGH (ref 30.0–36.0)
MCV: 82.5 fL (ref 80.0–100.0)
Platelets: 276 10*3/uL (ref 150–400)
RBC: 2.75 MIL/uL — ABNORMAL LOW (ref 4.22–5.81)
RDW: 17.2 % — ABNORMAL HIGH (ref 11.5–15.5)
WBC: 8.9 10*3/uL (ref 4.0–10.5)
nRBC: 0.2 % (ref 0.0–0.2)

## 2022-12-03 LAB — HEPARIN LEVEL (UNFRACTIONATED)
Heparin Unfractionated: 0.33 IU/mL (ref 0.30–0.70)
Heparin Unfractionated: 0.39 IU/mL (ref 0.30–0.70)

## 2022-12-03 NOTE — Progress Notes (Signed)
ANTICOAGULATION CONSULT NOTE Pharmacy Consult for Heparin Indication: chest pain/ACS Brief A/P: Heparin level within goal range Continue Heparin at current rate   Allergies  Allergen Reactions   Niacin And Related Itching   Zocor [Simvastatin-High Dose]     Muscle aches    Patient Measurements: Height: 6' (182.9 cm) Weight: 99.2 kg (218 lb 9.6 oz) IBW/kg (Calculated) : 77.6 Heparin Dosing Weight: 97.6 kg  Vital Signs: Temp: 98.7 F (37.1 C) (09/21 2007) Temp Source: Oral (09/21 2007) BP: 139/79 (09/21 2007) Pulse Rate: 78 (09/21 2007)  Labs: Recent Labs    12/01/22 1506 12/01/22 1824 12/02/22 0422 12/02/22 1024 12/02/22 1337 12/02/22 2336  HGB 8.9*  --  7.9*  --   --   --   HCT 24.4*  --  22.2*  --   --   --   PLT 302  --  268  --   --   --   LABPROT  --   --  15.4*  --   --   --   INR  --   --  1.2  --   --   --   HEPARINUNFRC  --   --  0.18*  --  0.26* 0.33  CREATININE 4.18*  --  4.14*  --   --   --   TROPONINIHS 72* 139*  --  32*  --   --     Estimated Creatinine Clearance: 25.4 mL/min (A) (by C-G formula based on SCr of 4.14 mg/dL (H)).   Assessment: 52 y.o. male with chest pain for heparin   Goal of Therapy:  Heparin level 0.3-0.7 units/ml Monitor platelets by anticoagulation protocol: Yes   Plan:  No change to heparin   Geannie Risen, PharmD, BCPS  12/03/2022 12:41 AM

## 2022-12-03 NOTE — Progress Notes (Addendum)
Informed Consent   Shared Decision Making/Informed Consent The risks [chest pain, shortness of breath, cardiac arrhythmias, dizziness, blood pressure fluctuations, myocardial infarction, stroke/transient ischemic attack, nausea, vomiting, allergic reaction, radiation exposure, metallic taste sensation and life-threatening complications (estimated to be 1 in 10,000)], benefits (risk stratification, diagnosing coronary artery disease, treatment guidance) and alternatives of a nuclear stress test were discussed in detail with George Erickson and he agrees to proceed.  Theodore Demark, PA-C 12/03/2022 10:53 AM

## 2022-12-03 NOTE — Progress Notes (Signed)
Progress Note  Patient Name: George Erickson Date of Encounter: 12/03/2022  Primary Cardiologist: Bryan Lemma, MD   Subjective   Feels well.  No chest pain since admission.  Inpatient Medications    Scheduled Meds:  amLODipine  10 mg Oral Daily   aspirin EC  81 mg Oral Daily   calcitRIOL  0.25 mcg Oral QPM   carvedilol  6.25 mg Oral BID   gabapentin  300 mg Oral QHS   hydrALAZINE  100 mg Oral Q8H   nitroGLYCERIN  1 inch Topical Q6H   rosuvastatin  20 mg Oral Daily   sodium bicarbonate  1,300 mg Oral BID   Continuous Infusions:  sodium chloride 20 mL/hr at 12/02/22 0307   heparin 1,700 Units/hr (12/02/22 2259)   PRN Meds: acetaminophen, nitroGLYCERIN, ondansetron (ZOFRAN) IV   Vital Signs    Vitals:   12/02/22 1327 12/02/22 1435 12/02/22 2007 12/03/22 0412  BP: (!) 143/82 138/73 139/79 (!) 150/95  Pulse:  83 78   Resp:  18 18 18   Temp:  98.5 F (36.9 C) 98.7 F (37.1 C) 98.3 F (36.8 C)  TempSrc:  Oral Oral Oral  SpO2:  100% 98% 99%  Weight:      Height:       No intake or output data in the 24 hours ending 12/03/22 0842  Filed Weights   12/01/22 1822 12/02/22 0219  Weight: 108.9 kg 99.2 kg    Telemetry    Sinus rhythm with occasional PACs- Personally Reviewed  ECG    Sinus rhythm with septal infarct, inferolateral T wave abnormalities - Personally Reviewed  Physical Exam   Gen: Appears comfortable, well-nourished CV: RRR, no dependent edema Pulm: breathing easily   Labs    Chemistry Recent Labs  Lab 12/01/22 1506 12/01/22 1824 12/02/22 0422  NA 142  --  142  K 3.8  --  4.1  CL 104  --  110  CO2 22  --  20*  GLUCOSE 98  --  105*  BUN 56*  --  57*  CREATININE 4.18*  --  4.14*  CALCIUM 8.7*  --  8.6*  PROT  --  6.2*  --   ALBUMIN  --  3.2*  --   AST  --  12*  --   ALT  --  17  --   ALKPHOS  --  48  --   BILITOT  --  0.8  --   GFRNONAA 16*  --  16*  ANIONGAP 16*  --  12     Hematology Recent Labs  Lab 12/01/22 1506  12/02/22 0422 12/03/22 0447  WBC 14.4* 9.5 8.9  RBC 2.91* 2.72* 2.75*  HGB 8.9* 7.9* 8.3*  HCT 24.4* 22.2* 22.7*  MCV 83.8 81.6 82.5  MCH 30.6 29.0 30.2  MCHC 36.5* 35.6 36.6*  RDW 17.2* 17.2* 17.2*  PLT 302 268 276    Cardiac EnzymesNo results for input(s): "TROPONINI" in the last 168 hours. No results for input(s): "TROPIPOC" in the last 168 hours.   BNPNo results for input(s): "BNP", "PROBNP" in the last 168 hours.   DDimer No results for input(s): "DDIMER" in the last 168 hours.   Summary of Pertinent studies    TTE: EF 70 to 75%.  No regional wall motion abnormalities.  Mild concentric LVH.  Cardiac cath: 01/2020 -reviewed in epic DES placed to OM 3  Imaging: CXR--no acute process  Labs: Creatinine 4.14, troponin 139  Patient Profile  52 y.o. male  with coronary artery disease with prior PCI of LCx/OM, HLD, HTN, CKD, OSA who is being seen 12/01/2022 for the evaluation of chest pain.   Assessment & Plan    Chest pain Similar in quality to prior MI He was chest pain-free after nitroglycerin and aspirin TnI peaked at 139 despite 45 min of chest pain LDL 58, HDL 41 -- Continue high intensity statin Continue home carvedilol Continue aspirin Plavix held for eye procedure No plan for heart catheterization Will further risk-stratify with non-invasive stress test prior to DC. Particularly since plavix was recently held He will reschedule eye surgery  CKD Creatinine is 4, GFR 16 Coronary angiogram places him at significant risk for progression to ESRD Continue home sodium bicarbonate  Hypertension Continue amlodipine, carvedilol, hydralazine Will adjust hydralazine schedule to 3 times daily, which he takes at home Will consider adding Imdur prior to discharge   Known chronic CAD Continue aspirin 81, continue carvedilol 6.25 Pravastatin switch to rosuvastatin 20 Plavix held due to anticipated eye surgery on Monday morning    For questions or updates,  please contact [facility] Please consult [email or web address] for contact info under Cardiology/STEMI.      Signed, Dimas Small, MD 12/03/2022, 8:42 AM

## 2022-12-03 NOTE — Plan of Care (Signed)
  Problem: Education: Goal: Understanding of cardiac disease, CV risk reduction, and recovery process will improve Outcome: Progressing Goal: Individualized Educational Video(s) Outcome: Progressing   Problem: Activity: Goal: Ability to tolerate increased activity will improve Outcome: Progressing   Problem: Cardiac: Goal: Ability to achieve and maintain adequate cardiovascular perfusion will improve Outcome: Progressing   Problem: Health Behavior/Discharge Planning: Goal: Ability to safely manage health-related needs after discharge will improve Outcome: Progressing   Problem: Education: Goal: Knowledge of General Education information will improve Description: Including pain rating scale, medication(s)/side effects and non-pharmacologic comfort measures Outcome: Progressing   Problem: Clinical Measurements: Goal: Ability to maintain clinical measurements within normal limits will improve Outcome: Progressing Goal: Will remain free from infection Outcome: Progressing Goal: Diagnostic test results will improve Outcome: Progressing Goal: Respiratory complications will improve Outcome: Progressing Goal: Cardiovascular complication will be avoided Outcome: Progressing

## 2022-12-04 ENCOUNTER — Inpatient Hospital Stay (HOSPITAL_COMMUNITY): Payer: BC Managed Care – PPO

## 2022-12-04 ENCOUNTER — Encounter (HOSPITAL_COMMUNITY): Payer: BC Managed Care – PPO

## 2022-12-04 ENCOUNTER — Other Ambulatory Visit (HOSPITAL_BASED_OUTPATIENT_CLINIC_OR_DEPARTMENT_OTHER): Payer: Self-pay

## 2022-12-04 DIAGNOSIS — R079 Chest pain, unspecified: Secondary | ICD-10-CM

## 2022-12-04 DIAGNOSIS — I214 Non-ST elevation (NSTEMI) myocardial infarction: Secondary | ICD-10-CM | POA: Diagnosis not present

## 2022-12-04 LAB — BASIC METABOLIC PANEL
Anion gap: 9 (ref 5–15)
BUN: 44 mg/dL — ABNORMAL HIGH (ref 6–20)
CO2: 22 mmol/L (ref 22–32)
Calcium: 9.1 mg/dL (ref 8.9–10.3)
Chloride: 109 mmol/L (ref 98–111)
Creatinine, Ser: 4.12 mg/dL — ABNORMAL HIGH (ref 0.61–1.24)
GFR, Estimated: 17 mL/min — ABNORMAL LOW (ref >60)
Glucose, Bld: 98 mg/dL (ref 70–99)
Potassium: 4.2 mmol/L (ref 3.5–5.1)
Sodium: 140 mmol/L (ref 135–145)

## 2022-12-04 LAB — NM MYOCAR MULTI W/SPECT W/WALL MOTION / EF
Estimated workload: 1
Exercise duration (min): 11 min
Exercise duration (sec): 24 s
MPHR: 168 {beats}/min
Peak HR: 102 {beats}/min
Percent HR: 60 %
Rest HR: 53 {beats}/min
ST Depression (mm): 0 mm

## 2022-12-04 LAB — CBC
HCT: 25.1 % — ABNORMAL LOW (ref 39.0–52.0)
Hemoglobin: 9 g/dL — ABNORMAL LOW (ref 13.0–17.0)
MCH: 30.1 pg (ref 26.0–34.0)
MCHC: 35.9 g/dL (ref 30.0–36.0)
MCV: 83.9 fL (ref 80.0–100.0)
Platelets: 313 10*3/uL (ref 150–400)
RBC: 2.99 MIL/uL — ABNORMAL LOW (ref 4.22–5.81)
RDW: 17.2 % — ABNORMAL HIGH (ref 11.5–15.5)
WBC: 8.6 10*3/uL (ref 4.0–10.5)
nRBC: 0.3 % — ABNORMAL HIGH (ref 0.0–0.2)

## 2022-12-04 LAB — LIPOPROTEIN A (LPA): Lipoprotein (a): 218.7 nmol/L — ABNORMAL HIGH

## 2022-12-04 MED ORDER — REGADENOSON 0.4 MG/5ML IV SOLN
0.4000 mg | Freq: Once | INTRAVENOUS | Status: AC
  Administered 2022-12-04: 0.4 mg via INTRAVENOUS
  Filled 2022-12-04: qty 5

## 2022-12-04 MED ORDER — NITROGLYCERIN 0.4 MG SL SUBL
0.4000 mg | SUBLINGUAL_TABLET | Freq: Once | SUBLINGUAL | Status: AC
  Administered 2022-12-04: 0.4 mg via SUBLINGUAL

## 2022-12-04 MED ORDER — TECHNETIUM TC 99M TETROFOSMIN IV KIT
10.7000 | PACK | Freq: Once | INTRAVENOUS | Status: AC | PRN
  Administered 2022-12-04: 10.7 via INTRAVENOUS

## 2022-12-04 MED ORDER — ISOSORBIDE MONONITRATE ER 30 MG PO TB24
30.0000 mg | ORAL_TABLET | Freq: Every day | ORAL | Status: DC
  Administered 2022-12-04: 30 mg via ORAL
  Filled 2022-12-04: qty 1

## 2022-12-04 MED ORDER — TECHNETIUM TC 99M TETROFOSMIN IV KIT
33.0000 | PACK | Freq: Once | INTRAVENOUS | Status: AC | PRN
  Administered 2022-12-04: 33 via INTRAVENOUS

## 2022-12-04 MED ORDER — ISOSORBIDE MONONITRATE ER 30 MG PO TB24: 30.0000 mg | ORAL_TABLET | Freq: Every day | ORAL | 3 refills | Status: DC

## 2022-12-04 MED ORDER — HYDRALAZINE HCL 100 MG PO TABS: 100.0000 mg | ORAL_TABLET | Freq: Three times a day (TID) | ORAL | 5 refills | Status: DC

## 2022-12-04 MED ORDER — ROSUVASTATIN CALCIUM 20 MG PO TABS: 20.0000 mg | ORAL_TABLET | Freq: Every day | ORAL | 5 refills | Status: DC

## 2022-12-04 MED ORDER — NITROGLYCERIN 0.4 MG SL SUBL
SUBLINGUAL_TABLET | SUBLINGUAL | Status: AC
  Filled 2022-12-04: qty 1

## 2022-12-04 MED ORDER — REGADENOSON 0.4 MG/5ML IV SOLN
INTRAVENOUS | Status: AC
  Filled 2022-12-04: qty 5

## 2022-12-04 NOTE — Discharge Summary (Addendum)
Discharge Summary    Patient ID: George Erickson MRN: [id number]; DOB: 04/24/1970  Admit date: 12/01/2022 Discharge date: 12/04/2022  PCP:  Shon Hale, MD   [facility] Providers Cardiologist:  Bryan Lemma, MD   {  Discharge Diagnoses    Principal Problem:   NSTEMI (non-ST elevated myocardial infarction) [facility]) Active Problems:   Essential hypertension   Hyperlipidemia with target LDL less than 70   CKD (chronic kidney disease), stage III (HCC)   Coronary artery disease involving native coronary artery of native heart with angina pectoris [facility])    Diagnostic Studies/Procedures    Nuclear medicine stress test 12/04/2022 Findings are consistent with no ischemia. The study is low risk.   No significant ST deviation was noted.   LV perfusion is abnormal. Basal inferior decreased uptake at both rest and stress. Mild decreased uptake anterior wall at both rest and stress (? body habitus)   Left ventricular function is normal. The left ventricular ejection fraction is normal (55-65%). End diastolic cavity size is normal.  Echocardiogram 12/02/2022 1. Left ventricular ejection fraction, by estimation, is 70 to 75%. Left  ventricular ejection fraction by PLAX is 72 %. The left ventricle has  hyperdynamic function. The left ventricle has no regional wall motion  abnormalities. There is moderate  concentric left ventricular hypertrophy. Left ventricular diastolic  parameters are indeterminate.   2. Right ventricular systolic function is normal. The right ventricular  size is normal. Tricuspid regurgitation signal is inadequate for assessing  PA pressure.   3. Left atrial size was mildly dilated.   4. The mitral valve is normal in structure. No evidence of mitral valve  regurgitation. No evidence of mitral stenosis.   5. The aortic valve is tricuspid. Aortic valve regurgitation is not  visualized. No aortic stenosis is present.   6. Velocities through pulmonic  valve are elevated at the level of the  RVOT (Vmax 2.26m/s mean gradient of ), valve not well visualized but  appears grossly normal. RV stroke volume calculated at , PV area  3.25cm2 by VTI.   7. The inferior vena cava is normal in size with greater than 50%  respiratory variability, suggesting right atrial pressure of 3 mmHg.    _____________   History of Present Illness     George Erickson is a 52 y.o. male with history of coronary artery disease with prior PCI of the left circumflex/OM in stent restenosis of OM3, hyperlipidemia, hypertension, CKD on transplant list, OSA.  He was admitted on 12/01/2022 for further evaluation of NSTEMI.  Currently he is 2 years out from his NSTEMI.  He had PCI of the left circumflex-OM 3 with 7% in-stent restenosis lesion in OM 3 with 80% stenosis.  He had mid RCA 60 to 65% that was treated medically.  He is on Plavix chronically with consideration of being placed on renal transplant list. Initially presented on 12/01/2022 with acute onset of chest pain with pressure that felt to sharp and knifelike on his left side.  He had some tingliness and heaviness in his arm as well.  He noted that the symptoms were consistent with his prior heart attack.  EKG showed nonspecific inferolateral ST wave changes.  Troponins were 72-139.  Hospital Course     Consultants:    NSTEMI CAD status post PCI Lcx/OM Patient reporting left-sided chest pain with feelings similar to prior MI. Chest pain without any other associated symptoms.  Had been reporting more fatigue.  EKG showing  inferolateral ST changes.  Troponin [id number].  Echocardiogram shows hyperdynamic function EF 70 to 75% with no regional wall motion abnormalities.  Moderate LVH.  Normal RV function.  Given severe renal dysfunction not a great candidate for catheterization.  Stress test today suggests no ischemia and low risk study.  He did have symptoms similar to his MI during this test but relieved by  nitroglycerin and some EKG changes, but overall study not indicative of reversible ischemia He did have some abnormal LV perfusion with basal inferior decreased uptake at both rest and stress thought possibly related to his body habitus.  EF 55-65.  Will continue to medically manage and titrate antianginals as needed. Continue aspirin, holding Plavix for upcoming eye surgery.  This initially was for today.  May be rescheduled to Thursday. Discussed with patient and he's well aware.  Continue carvedilol 6.25 mg, adding Imdur 30 mg.  He is not taking his sildenafil.  Discussed contraindications to this.    CKD Possible renal transplant list? Creatinine 4.12.  Stable this admission. Followed by nephro.    Hypertension Continue amlodipine, carvedilol, hydralazine 100 mg every 8.   Hyperlipidemia Continue reduced dose of rosuvastatin 20 mg.  Patient seen and examined by myself and Dr. Flora Lipps and deemed stable for discharge.  Follow-up has been made.  Medications have been sent to the patient's home pharmacy at [facility] on [address].  Did the patient have an acute coronary syndrome (MI, NSTEMI, STEMI, etc) this admission?:  No.   The elevated Troponin was due to the acute medical illness (demand ischemia).  _____________  Discharge Vitals Blood pressure (!) 160/106, pulse 60, temperature 98.3 F (36.8 C), temperature source Oral, resp. rate 20, height 6' (1.829 m), weight 99.2 kg, SpO2 96%.  Filed Weights   12/01/22 1822 12/02/22 0219  Weight: 108.9 kg 99.2 kg    Labs & Radiologic Studies    CBC Recent Labs    12/03/22 0447 12/04/22 0950  WBC 8.9 8.6  HGB 8.3* 9.0*  HCT 22.7* 25.1*  MCV 82.5 83.9  PLT 276 313   Basic Metabolic Panel Recent Labs    16/10/96 0422 12/04/22 0950  NA 142 140  K 4.1 4.2  CL 110 109  CO2 20* 22  GLUCOSE 105* 98  BUN 57* 44*  CREATININE 4.14* 4.12*  CALCIUM 8.6* 9.1   Liver Function Tests Recent Labs    12/01/22 1824  AST 12*  ALT 17   ALKPHOS 48  BILITOT 0.8  PROT 6.2*  ALBUMIN 3.2*   Recent Labs    12/01/22 1824  LIPASE 45   High Sensitivity Troponin:   Recent Labs  Lab 12/01/22 1506 12/01/22 1824 12/02/22 1024  TROPONINIHS 72* 139* 32*    BNP Invalid input(s): "POCBNP" D-Dimer No results for input(s): "DDIMER" in the last 72 hours. Hemoglobin A1C No results for input(s): "HGBA1C" in the last 72 hours. Fasting Lipid Panel Recent Labs    12/02/22 0422  CHOL 109  HDL 43  LDLCALC 58  TRIG 41  CHOLHDL 2.5   Thyroid Function Tests No results for input(s): "TSH", "T4TOTAL", "T3FREE", "THYROIDAB" in the last 72 hours.  Invalid input(s): "FREET3" _____________  NM Myocar Multi W/Spect W/Wall Motion / EF  Result Date: 12/04/2022   Findings are consistent with no ischemia. The study is low risk.   No significant ST deviation was noted.   LV perfusion is abnormal. Basal inferior decreased uptake at both rest and stress. Mild decreased uptake anterior wall at both  rest and stress (? body habitus)   Left ventricular function is normal. The left ventricular ejection fraction is normal (55-65%). End diastolic cavity size is normal.   ECHOCARDIOGRAM COMPLETE  Result Date: 12/02/2022    ECHOCARDIOGRAM REPORT   Patient Name:   JEREMY TRUELOVE Date of Exam: 12/02/2022 Medical Rec #:  [id number]        Height:       72.0 in Accession #:    [id number]       Weight:       218.6 lb Date of Birth:  1970-07-31        BSA:          2.213 m Patient Age:    52 years         BP:           159/92 mmHg Patient Gender: M                HR:           63 bpm. Exam Location:  Inpatient Procedure: 2D Echo, Color Doppler and Cardiac Doppler Indications:    Acute Ischemic Heart Disease i24.9  History:        Patient has prior history of Echocardiogram examinations, most                 recent 02/05/2020. CAD; Risk Factors:Hypertension, Dyslipidemia                 and Sleep Apnea.  Sonographer:    Irving Burton Senior RDCS Referring Phys:  [id number] DENNIS NARCISSE JR IMPRESSIONS  1. Left ventricular ejection fraction, by estimation, is 70 to 75%. Left ventricular ejection fraction by PLAX is 72 %. The left ventricle has hyperdynamic function. The left ventricle has no regional wall motion abnormalities. There is moderate concentric left ventricular hypertrophy. Left ventricular diastolic parameters are indeterminate.  2. Right ventricular systolic function is normal. The right ventricular size is normal. Tricuspid regurgitation signal is inadequate for assessing PA pressure.  3. Left atrial size was mildly dilated.  4. The mitral valve is normal in structure. No evidence of mitral valve regurgitation. No evidence of mitral stenosis.  5. The aortic valve is tricuspid. Aortic valve regurgitation is not visualized. No aortic stenosis is present.  6. Velocities through pulmonic valve are elevated at the level of the RVOT (Vmax 2.44m/s mean gradient of ), valve not well visualized but appears grossly normal. RV stroke volume calculated at , PV area 3.25cm2 by VTI.  7. The inferior vena cava is normal in size with greater than 50% respiratory variability, suggesting right atrial pressure of 3 mmHg. FINDINGS  Left Ventricle: Left ventricular ejection fraction, by estimation, is 70 to 75%. Left ventricular ejection fraction by PLAX is 72 %. The left ventricle has hyperdynamic function. The left ventricle has no regional wall motion abnormalities. The left ventricular internal cavity size was normal in size. There is moderate concentric left ventricular hypertrophy. Left ventricular diastolic parameters are indeterminate. The ratio of pulmonic flow to systemic flow (Qp/Qs ratio) is 0.80. Right Ventricle: The right ventricular size is normal. No increase in right ventricular wall thickness. Right ventricular systolic function is normal. Tricuspid regurgitation signal is inadequate for assessing PA pressure. Left Atrium: Left atrial size was mildly  dilated. Right Atrium: Right atrial size was normal in size. Pericardium: There is no evidence of pericardial effusion. Mitral Valve: The mitral valve is normal in structure. No evidence of mitral valve regurgitation. No evidence of  mitral valve stenosis. Tricuspid Valve: The tricuspid valve is normal in structure. Tricuspid valve regurgitation is not demonstrated. No evidence of tricuspid stenosis. Aortic Valve: The aortic valve is tricuspid. Aortic valve regurgitation is not visualized. No aortic stenosis is present. Aortic valve mean gradient measures 7.0 mmHg. Aortic valve peak gradient measures 11.3 mmHg. Aortic valve area, by VTI measures 4.73  cm. Pulmonic Valve: Velocities through pulmonic valve are elevated at the level of the RVOT (Vmax 2.45m/s mean gradient of ), valve not well visualized but appears grossly normal. RV stroke volume calculated at , PV area 3.25cm2 by VTI. The pulmonic valve was grossly normal. Pulmonic valve regurgitation is not visualized. Aorta: The aortic root and ascending aorta are structurally normal, with no evidence of dilitation. Venous: The inferior vena cava is normal in size with greater than 50% respiratory variability, suggesting right atrial pressure of 3 mmHg. IAS/Shunts: No atrial level shunt detected by color flow Doppler. The ratio of pulmonic flow to systemic flow (Qp/Qs ratio) is 0.80.  LEFT VENTRICLE PLAX 2D LV EF:         Left            Diastology                ventricular     LV e' medial:    9.14 cm/s                ejection        LV E/e' medial:  9.0                fraction by     LV e' lateral:   9.14 cm/s                PLAX is 72      LV E/e' lateral: 9.0                %. LVIDd:         4.60 cm LVIDs:         2.70 cm LV PW:         1.40 cm LV IVS:        1.40 cm LVOT diam:     2.50 cm LV SV:         140 LV SV Index:   63 LVOT Area:     4.91 cm  RIGHT VENTRICLE RV S prime:     16.60 cm/s RVOT diam:      2.00 cm TAPSE (M-mode): 2.4 cm LEFT ATRIUM              Index        RIGHT ATRIUM           Index LA diam:        3.80 cm 1.72 cm/m   RA Area:     22.10 cm LA Vol (A2C):   93.2 ml 42.12 ml/m  RA Volume:   65.90 ml  29.78 ml/m LA Vol (A4C):   84.6 ml 38.24 ml/m LA Biplane Vol: 88.1 ml 39.82 ml/m  AORTIC VALVE                     PULMONIC VALVE AV Area (Vmax):    4.41 cm      PV Area (Vmax):  2.77 cm AV Area (Vmean):   4.35 cm      PV Area (Vmean): 2.60 cm AV Area (VTI):     4.73 cm  PV Area (VTI):   3.25 cm AV Vmax:           168.00 cm/s   PV Vmax:         2.19 m/s AV Vmean:          124.000 cm/s  PV Vmean:        152.000 cm/s AV VTI:            0.296 m       PV VTI:          0.361 m AV Peak Grad:      11.3 mmHg     PV Peak grad:    19.2 mmHg AV Mean Grad:      7.0 mmHg      PV Mean grad:    10.0 mmHg LVOT Vmax:         151.00 cm/s   RVOT Peak grad:  15 mmHg LVOT Vmean:        110.000 cm/s LVOT VTI:          0.285 m LVOT/AV VTI ratio: 0.96  AORTA Ao Root diam: 3.60 cm Ao Asc diam:  3.40 cm MITRAL VALVE MV Area (PHT): 4.17 cm    SHUNTS MV Decel Time: 182 msec    Systemic VTI:  0.29 m MV E velocity: 81.90 cm/s  Systemic Diam: 2.50 cm MV A velocity: 64.60 cm/s  Pulmonic VTI:  0.374 m MV E/A ratio:  1.27        Pulmonic Diam: 2.00 cm                            Qp/Qs:         0.84 Donato Schultz MD Electronically signed by Donato Schultz MD Signature Date/Time: 12/02/2022/3:52:30 PM    Final    DG Chest 2 View  Result Date: 12/01/2022 CLINICAL DATA:  Chest Pain EXAM: CHEST - 2 VIEW COMPARISON:  None Available. FINDINGS: The heart size and mediastinal contours are within normal limits. No consolidation, pneumothorax or effusion. No edema. The visualized skeletal structures are unremarkable. IMPRESSION: No acute cardiopulmonary disease. Electronically Signed   By: Karen Kays M.D.   On: 12/01/2022 17:54   MR LUMBAR SPINE WO CONTRAST  Result Date: 11/25/2022 CLINICAL DATA:  Low back pain. Prior surgery. Chronic kidney disease. EXAM: MRI LUMBAR SPINE  WITHOUT CONTRAST TECHNIQUE: Multiplanar, multisequence MR imaging of the lumbar spine was performed. No intravenous contrast was administered. COMPARISON:  Lumbar spine radiographs 11/25/2022 MR of the lumbar spine without contrast 08/06/20 FINDINGS: Segmentation: 5 non rib-bearing lumbar type vertebral bodies are present. The lowest fully formed vertebral body is L5. Alignment: 3 mm grade 1 anterolisthesis at L4-5 has progressed since 2022. No other significant listhesis is present. Rightward curvature of the lumbar spine is centered at L2-3. Vertebrae: Marrow signal is diffusely depressed, consistent with chronic renal insufficiency. Chronic type 2 Modic changes at L4-5 and L5-S1 have progressed since the prior exam. Conus medullaris and cauda equina: Conus extends to the L2 level. Conus and cauda equina appear normal. Paraspinal and other soft tissues: Bilateral renal atrophy is present. No solid lesions are present. No significant adenopathy is present. Disc levels: L1-2: Normal disc signal and height is present. No focal protrusion or stenosis is present. L2-3: Mild disc bulging and facet hypertrophy is present. No focal stenosis is present. L3-4: A broad-based disc protrusion is present. Moderate facet hypertrophy present bilaterally. No significant stenosis is present.  L4-5: Right laminectomy is again noted. Previously seen superior disc extrusion is no longer visible. Uncovering of a broad-based disc protrusion is present. Mild subarticular narrowing bilaterally is improved compared to the prior exam. Mild foraminal narrowing bilaterally has progressed slightly. L5-S1: A left paramedian disc protrusion is present. Left laminectomy is noted. Mild left subarticular narrowing is improved. Mild left foraminal narrowing is similar to the prior exam. IMPRESSION: 1. Right laminectomy at L4-5. Previously seen superior disc extrusion is no longer visible. 2. Mild subarticular narrowing bilaterally at L4-5 is improved  compared to the prior exam. 3. Mild foraminal narrowing bilaterally at L4-5 has progressed slightly. 4. Left laminectomy at L5-S1 with mild left subarticular narrowing improved. 5. Mild left foraminal narrowing at L5-S1 is similar to the prior exam. 6. Mild disc bulging and facet hypertrophy at L2-3 and L3-4 without significant stenosis. Electronically Signed   By: Marin Roberts M.D.   On: 11/25/2022 18:52   DG Lumbar Spine Complete  Result Date: 11/25/2022 CLINICAL DATA:  Worsening back pain EXAM: LUMBAR SPINE - COMPLETE 5 VIEW COMPARISON:  MRI lumbar spine dated 08/06/2020, lumbar spine radiograph dated 08/19/2020 FINDINGS: There is no evidence of lumbar spine fracture. Similar grade 1 anterolisthesis at L4-5 compared to 08/19/2020. Intervertebral disc space narrowing at L5-S1, as before. Multilevel facet arthropathy of the lumbar spine. IMPRESSION: 1. Similar grade 1 anterolisthesis at L4-5 compared to 08/19/2020. 2. Unchanged intervertebral disc space narrowing at L5-S1 and multilevel facet arthropathy of the lumbar spine. Electronically Signed   By: Agustin Cree M.D.   On: 11/25/2022 14:17   Disposition   Pt is being discharged home today in good condition.  Follow-up Plans & Appointments     Follow-up Information     Jodelle Gross, NP Follow up.   Specialties: Cardiology, Radiology, Cardiology Why: Thursday Dec 21, 2022 Appt at 2:20 PM (25 min) Contact information: [address] [place] [address] [phone]                   Discharge Medications   Allergies as of 12/04/2022       Reactions   Niacin And Related Itching   Zocor [simvastatin-high Dose]    Muscle aches        Medication List     STOP taking these medications    pravastatin 40 MG tablet Commonly known as: PRAVACHOL       TAKE these medications    amLODipine 10 MG tablet Commonly known as: NORVASC Take 1 tablet by mouth daily.   ascorbic acid 500 MG  tablet Commonly known as: VITAMIN C Take 500 mg by mouth daily.   calcitRIOL 0.25 MCG capsule Commonly known as: ROCALTROL Take 0.25 mcg by mouth every evening.   carvedilol 6.25 MG tablet Commonly known as: COREG Take 1 tablet by mouth twice daily   clopidogrel 75 MG tablet Commonly known as: PLAVIX TAKE 1 TABLET BY MOUTH ONCE DAILY START  AFTER  YOU  COMPLETE  BRILINTA   diazepam 2 MG tablet Commonly known as: Valium Take 1 tablet (2 mg total) by mouth every 6 (six) hours as needed for muscle spasms.   gabapentin 300 MG capsule Commonly known as: NEURONTIN Take 300 mg by mouth at bedtime.   hydrALAZINE 100 MG tablet Commonly known as: APRESOLINE Take 1 tablet (100 mg total) by mouth every 8 (eight) hours. What changed: when to take this   isosorbide mononitrate 30 MG 24 hr tablet Commonly known as: IMDUR  Take 1 tablet (30 mg total) by mouth daily. Start taking on: December 05, 2022   lidocaine 5 % Commonly known as: Lidoderm Place 1 patch onto the skin daily. Remove & Discard patch within 12 hours or as directed by MD   nitroGLYCERIN 0.4 MG SL tablet Commonly known as: NITROSTAT Place 1 tablet (0.4 mg total) under the tongue every 5 (five) minutes as needed for chest pain.   nitroGLYCERIN 0.4 MG SL tablet Commonly known as: NITROSTAT See admin instructions.   oxyCODONE 5 MG immediate release tablet Commonly known as: Roxicodone Take 1 tablet (5 mg total) by mouth every 4 (four) hours as needed for severe pain.   potassium chloride SA 20 MEQ tablet Commonly known as: KLOR-CON M Take 2 tablets (40 mEq total) by mouth daily for 5 days.   ProAir RespiClick 108 (90 Base) MCG/ACT Aepb Generic drug: Albuterol Sulfate Inhale 2 puffs into the lungs every 4 (four) hours as needed (SOB/Wheezing).   rosuvastatin 20 MG tablet Commonly known as: CRESTOR Take 1 tablet (20 mg total) by mouth daily.   sennosides-docusate sodium 8.6-50 MG tablet Commonly known as:  SENOKOT-S Take 1 tablet by mouth daily as needed for constipation.   sodium bicarbonate 650 MG tablet Take 1,300 mg by mouth 2 (two) times daily.           Outstanding Labs/Studies    Duration of Discharge Encounter   Greater than 30 minutes including physician time.  Signed, Abagail Kitchens, PA-C 12/04/2022, 5:18 PM

## 2022-12-04 NOTE — Progress Notes (Addendum)
      [name] presented for a nuclear stress test today.  1 minutes into the stress test, he developed resting SOB. 2 minutes into the stress test, he developed mid-sternum chest pain radiating to his left arm, fatigue, and abdominal pain. Symptoms are similar to presenting chest pain for this admission. VS remained stable. EKG showed more prominent ST depression of inferolateral leads and unchanged up-sloping of ST of V2-V3. Patient was given 0.4mg  SL nitroglycerin x1 with BP 135/65. Given renal disease, IV dilaudid was attempted but he started to improve with symptoms and this was aborted. At the end of test, he had minimal chest pain2-3/10 and resolved SOB as well as left arm pain. He feels comfortable to complete the stress imaging. Will proceed with stress imaging. Pending result to determine further workup and management. Plan reviewed with Dr Flora Lipps and the patient.   Preliminary EKG findings may be listed in the chart, but the stress test result will not be finalized until perfusion imaging is complete.  1 day study, 1 to read.  Cyndi Bender, AGNP-BC 12/04/2022, 1:30 PM

## 2022-12-04 NOTE — Progress Notes (Signed)
Patient Name: George Erickson Date of Encounter: 12/04/2022 [facility] Cardiologist: Bryan Lemma, MD   Interval Summary  .    Plans for stress test today.  No chest pain or shortness of breath.  Vital Signs .    Vitals:   12/03/22 1252 12/03/22 1510 12/03/22 2051 12/04/22 0414  BP: (!) 174/98 (!) 158/92 (!) 147/84 133/76  Pulse:   65 60  Resp:   18 18  Temp:   98.2 F (36.8 C) 98.7 F (37.1 C)  TempSrc:   Oral Oral  SpO2:   96% 96%  Weight:      Height:        Intake/Output Summary (Last 24 hours) at 12/04/2022 0749 Last data filed at 12/03/2022 1300 Gross per 24 hour  Intake 480 ml  Output --  Net 480 ml      12/02/2022    2:19 AM 12/01/2022    6:22 PM 11/25/2022    5:01 PM  Last 3 Weights  Weight (lbs) 218 lb 9.6 oz 240 lb 1.3 oz 240 lb  Weight (kg) 99.156 kg 108.9 kg 108.863 kg      Telemetry/ECG    Normal sinus rhythm heart rate 60-70s- Personally Reviewed  CV Studies    Echocardiogram 12/02/2022 1. Left ventricular ejection fraction, by estimation, is 70 to 75%. Left  ventricular ejection fraction by PLAX is 72 %. The left ventricle has  hyperdynamic function. The left ventricle has no regional wall motion  abnormalities. There is moderate  concentric left ventricular hypertrophy. Left ventricular diastolic  parameters are indeterminate.   2. Right ventricular systolic function is normal. The right ventricular  size is normal. Tricuspid regurgitation signal is inadequate for assessing  PA pressure.   3. Left atrial size was mildly dilated.   4. The mitral valve is normal in structure. No evidence of mitral valve  regurgitation. No evidence of mitral stenosis.   5. The aortic valve is tricuspid. Aortic valve regurgitation is not  visualized. No aortic stenosis is present.   6. Velocities through pulmonic valve are elevated at the level of the  RVOT (Vmax 2.7m/s mean gradient of ), valve not well visualized but  appears grossly  normal. RV stroke volume calculated at , PV area  3.25cm2 by VTI.   7. The inferior vena cava is normal in size with greater than 50%  respiratory variability, suggesting right atrial pressure of 3 mmHg.    Physical Exam .   GEN: No acute distress.   Neck: No JVD Cardiac: RRR, no murmurs, rubs, or gallops.  Respiratory: Clear to auscultation bilaterally. GI: Soft, nontender, non-distended  MS: No edema  Patient Profile    George Erickson is a 52 y.o. male has hx of  coronary artery disease with prior PCI of LCx/OM, HLD, HTN, CKD, OSA  and admitted on 12/01/2018 for for the evaluation of NSTEMI.  Assessment & Plan .     NSTEMI CAD status post PCI Lcx/OM Patient reporting left-sided chest pain with feelings similar to prior MI. Chest pain without any other associated symptoms.  Had been reporting more fatigue.  EKG showing inferolateral ST changes.  Troponin 72-1 39.  Echocardiogram shows hyperdynamic function EF 70 to 75% with no regional wall motion abnormalities.  Moderate LVH.  Normal RV function.  Given severe renal dysfunction not a great candidate for catheterization.  Planning for nuclear stress test today. Continue aspirin, holding Plavix for upcoming eye surgery.  This initially was for  today.  May be rescheduled to Thursday. Continue carvedilol 6.25 mg, adding Imdur 30 mg. Possible discharge today if imaging comes back normal. Heparin >48 hrs now stopped.   CKD Labs have not been drawn in the past 2 days.  Obtaining labs today.  Creatinine 4.14.  Hypertension Continue amlodipine, carvedilol, hydralazine 100 mg every 8.  Hyperlipidemia Continue reduced dose of rosuvastatin 20 mg.  For questions or updates, please contact [facility] Please consult [email or web address] for contact info under        Signed, Abagail Kitchens, PA-C

## 2022-12-05 ENCOUNTER — Encounter (HOSPITAL_COMMUNITY)
Admission: RE | Admit: 2022-12-05 | Discharge: 2022-12-05 | Disposition: A | Discharge status: Home / Self Care | Payer: BC Managed Care – PPO | Source: Ambulatory Visit | Attending: Nephrology | Admitting: Nephrology

## 2022-12-05 VITALS — BP 155/82 | HR 84 | Temp 97.4°F | Resp 19

## 2022-12-05 DIAGNOSIS — N183 Chronic kidney disease, stage 3 unspecified: Secondary | ICD-10-CM | POA: Diagnosis present

## 2022-12-05 MED ORDER — EPOETIN ALFA-EPBX 10000 UNIT/ML IJ SOLN
INTRAMUSCULAR | Status: AC
  Filled 2022-12-05: qty 2

## 2022-12-05 MED ORDER — EPOETIN ALFA-EPBX 10000 UNIT/ML IJ SOLN
20000.0000 [IU] | INTRAMUSCULAR | Status: DC
  Administered 2022-12-05: 20000 [IU] via SUBCUTANEOUS

## 2022-12-10 MED ORDER — BETAMETHASONE SOD PHOS & ACET 6 (3-3) MG/ML IJ SUSP
3.0000 mg | Freq: Once | INTRAMUSCULAR | Status: AC
Start: 2022-12-10 — End: ?

## 2022-12-10 NOTE — Progress Notes (Signed)
Chief Complaint  Patient presents with   Foot Pain    Patient is here for gout flare up since labor day went to er had prednisone and Vicodin nothing is helping    HPI: 52 y.o. male presenting today as a new patient for evaluation of pain and tenderness associated to the right foot secondary to gout.  Onset about 2 weeks ago.  He has had pain and tenderness ever since.  He did go to an urgent care where x-rays were taken negative for any abnormality.  He was diagnosed with gout.  He presents for further treatment and evaluation.  He says that the anti-inflammatories are not helping  Past Medical History:  Diagnosis Date   Allergic rhinitis due to pollen    Arthritis    lower back   CAD S/P DES PCI D1 & OM3 11/15/2009   a) NSTEMI - 100% D1 (DES PCI - Promus DES 2.25 x 23 -- 2.3 mm); otherwise minimal CAD in a co-dominant system;; b) 02/06/20: OM3 80% (DES PCI), D1 70% ISR, mRCA 60-65%.    Chronic left shoulder pain    Impingement syndrome   Erectile dysfunction    Gout    Hyperlipidemia with target LDL less than 70    Associated CAD   Hypertension    Hypertensive kidney disease with CKD stage IV (HCC)    Cr as of August 2021 1.92   Non-ST elevation (NSTEMI) myocardial infarction (HCC) 11/2009; 01/2020   a) 9/'11: Cath - 100% D1 --> DES PCI Promus 2.25 x 23 (2.3 mm).  EF 55% with anterolateral HK. -> patent stent in Sept 2012 & 2015; b) 02/05/2020: NSTEMI - Echo EF >55% no RWMA - 80% OM3 (DES PCI), 70% D1 ISR, 60-65% mRCA.   Obesity (BMI 30.0-34.9)    OSA on CPAP    uses cpap   Vertigo     Past Surgical History:  Procedure Laterality Date   CORONARY STENT INTERVENTION  02/06/2020   [facility] [place], [place] -> Dr. Jeanne Ivan): NSTEMI: OM3 80% -> DES PCI: Synergy DES 2.5 x 20 - > 2.6 mm   CORONARY STENT PLACEMENT  11/15/2009   (Dr. Excell Seltzer) -- DES PCI of 100% D1 - Promus DES 2.25 x 23 --> 2.3 mm   HERNIA REPAIR     hernia repair at birth   LEFT HEART CATH  AND CORONARY ANGIOGRAPHY  11/15/2009   (Dr. Excell Seltzer): NSTEMI -- LM-< LAD & LCx. ~mLAD ~30-40% just past D1 (D1 - 100% - CULPRIT - PCI), LCx normal - 2 OM & LPL. Dom RCA with Large PDA (small PL), ~30% mRCA.  EF ~55% with Ant-Lat HK    LEFT HEART CATH AND CORONARY ANGIOGRAPHY  11/22/2010   (Dr. Donnie Aho): Patent Diag 1 stent with minimal CAD elsewhere.  EF ~55-60% with Anterolateral HK   LEFT HEART CATH AND CORONARY ANGIOGRAPHY  12/31/2013   [facility] - for Abnormal Myoview) --> Non-obstructive CAD with widely patent D1 stent.  LVEDP ~16 mmHg   LEFT HEART CATH AND CORONARY ANGIOGRAPHY  02/06/2020   [facility] [place], [place] -> Dr. Mellody Drown): NSTEMI: D1 70% mid stent ISR; OM3 80% (-> DES PCI), mRCA 60-65%.     LUMBAR LAMINECTOMY/ DECOMPRESSION WITH MET-RX N/A 01/07/2021   Procedure: Minimally Invasive Microdiscectomy right Lumbar four-Five, Left Lumbar five-Sacral one;  Surgeon: Dawley, Alan Mulder, DO;  Location: [facility] OR;  Service: Neurosurgery;  Laterality: N/A;   NASAL SINUS SURGERY  2010  For sleep apnea   NM MYOVIEW LTD  12/05/2019   EF> 65%.  Fixed anterior perfusion defect with normal motion suggesting artifact.  Read as LOW RISK -> but NOT NORMAL   TM MYOVIEW  12/2013   (FALSE POSITIVE) + TM - lateral ST depressions (also noted with recovery - near syncope) -> Reversible Anterior Wall Perfusion defect - CRO Ischemia; Partialy reversible Inferior defect - ~ diaphragmatic attenuation. EF ~48%. --> Referred for CATH --> FALSE POSTIIVE   TRANSTHORACIC ECHOCARDIOGRAM  07/08/2014   EF 60%. No RWMA. Mild LA dilation. Normal walves.   TRANSTHORACIC ECHOCARDIOGRAM  02/05/2020   [facility] [place], [place] -> Dr. Mellody Drown): NSTEMI: EF> 55%. No R WMA. Normal valves.    Allergies  Allergen Reactions   Niacin And Related Itching   Zocor [Simvastatin-High Dose]     Muscle aches     Physical Exam: General: The patient is alert and oriented x3 in no acute  distress.  Dermatology: Skin is warm, dry and supple bilateral lower extremities.  No open wounds  Vascular: Palpable pedal pulses bilaterally. Capillary refill within normal limits.  There is some edema with erythema mostly localized around the great toe joint  Neurological: Grossly intact via light touch  Musculoskeletal Exam: Erythema with edema associated to the great toe joint with associated tenderness with palpation consistent with findings of gout  Radiographic Exam RT foot 11/24/2022:  Normal osseous mineralization. Joint spaces preserved.  No fractures or osseous irregularities noted.  Impression: Negative  Assessment/Plan of Care: 1.  Acute inflammatory gout right foot  -Patient evaluated -Any anti-inflammatory medication -Injection of 0.5 cc Celestone Soluspan injected around the first MTP right foot -Recommend stiff soled shoe that does not allow much motion to the forefoot or great toe joint. -Return to clinic 3 weeks     Felecia Shelling, DPM [facility]  Dr. Felecia Shelling, DPM    [address] [place], [place] [address]                Office [phone]  Fax [phone]

## 2022-12-13 ENCOUNTER — Encounter (HOSPITAL_COMMUNITY): Payer: BC Managed Care – PPO

## 2022-12-14 NOTE — Progress Notes (Signed)
CKD IV.   George Spore T. Flora Lipps, MD, [facility]  [facility]  [address] [place], [place] [address] [phone]  6:50 PM

## 2022-12-18 ENCOUNTER — Encounter (HOSPITAL_COMMUNITY)
Admission: RE | Admit: 2022-12-18 | Discharge: 2022-12-18 | Disposition: A | Discharge status: Home / Self Care | Payer: BC Managed Care – PPO | Source: Ambulatory Visit | Attending: Nephrology | Admitting: Nephrology

## 2022-12-18 VITALS — BP 140/83 | HR 69 | Temp 97.5°F | Resp 17

## 2022-12-18 DIAGNOSIS — N183 Chronic kidney disease, stage 3 unspecified: Secondary | ICD-10-CM | POA: Diagnosis present

## 2022-12-18 LAB — POCT HEMOGLOBIN-HEMACUE: Hemoglobin: 8.6 g/dL — ABNORMAL LOW (ref 13.0–17.0)

## 2022-12-18 LAB — IRON AND TIBC
Iron: 39 ug/dL — ABNORMAL LOW (ref 45–182)
Saturation Ratios: 15 % — ABNORMAL LOW (ref 17.9–39.5)
TIBC: 256 ug/dL (ref 250–450)
UIBC: 217 ug/dL

## 2022-12-18 LAB — FERRITIN: Ferritin: 303 ng/mL (ref 24–336)

## 2022-12-18 MED ORDER — EPOETIN ALFA-EPBX 10000 UNIT/ML IJ SOLN
INTRAMUSCULAR | Status: AC
  Filled 2022-12-18: qty 2

## 2022-12-18 MED ORDER — EPOETIN ALFA-EPBX 10000 UNIT/ML IJ SOLN
20000.0000 [IU] | INTRAMUSCULAR | Status: DC
  Administered 2022-12-18: 20000 [IU] via SUBCUTANEOUS

## 2022-12-20 NOTE — Progress Notes (Unsigned)
Cardiology Clinic Note   Patient Name: George Erickson Date of Encounter: 12/20/2022  Primary Care Provider:  Shon Hale, MD Primary Cardiologist:  Bryan Lemma, MD  Patient Profile    52  history of coronary artery disease with prior PCI of the left circumflex/OM in stent restenosis of OM3, hyperlipidemia, hypertension, CKD on transplant list, OSA.  He was admitted on 12/01/2022 for further evaluation of NSTEMI. He reporting left-sided chest pain with feelings similar to prior MI. Chest pain without any other associated symptoms.   EKG showing inferolateral ST changes.  Troponin [id number].  Echocardiogram shows hyperdynamic function EF 70 to 75% with no regional wall motion abnormalities.  Moderate LVH.  Normal RV function. He had severe renal dysfunction not a great candidate for catheterization.  Stress test suggests no ischemia and low risk study.  He was continued on DAPT, antihypertensives to include hydralazine 100 mg every 8 hours, and treatment with statin therapy.  Past Medical History    Past Medical History:  Diagnosis Date   Allergic rhinitis due to pollen    Arthritis    lower back   CAD S/P DES PCI D1 & OM3 11/15/2009   a) NSTEMI - 100% D1 (DES PCI - Promus DES 2.25 x 23 -- 2.3 mm); otherwise minimal CAD in a co-dominant system;; b) 02/06/20: OM3 80% (DES PCI), D1 70% ISR, mRCA 60-65%.    Chronic left shoulder pain    Impingement syndrome   Erectile dysfunction    Gout    Hyperlipidemia with target LDL less than 70    Associated CAD   Hypertension    Hypertensive kidney disease with CKD stage IV (HCC)    Cr as of August 2021 1.92   Non-ST elevation (NSTEMI) myocardial infarction (HCC) 11/2009; 01/2020   a) 9/'11: Cath - 100% D1 --> DES PCI Promus 2.25 x 23 (2.3 mm).  EF 55% with anterolateral HK. -> patent stent in Sept 2012 & 2015; b) 02/05/2020: NSTEMI - Echo EF >55% no RWMA - 80% OM3 (DES PCI), 70% D1 ISR, 60-65% mRCA.   Obesity (BMI 30.0-34.9)    OSA  on CPAP    uses cpap   Vertigo    Past Surgical History:  Procedure Laterality Date   CORONARY STENT INTERVENTION  02/06/2020   [facility] [place], [place] -> Dr. Jeanne Ivan): NSTEMI: OM3 80% -> DES PCI: Synergy DES 2.5 x 20 - > 2.6 mm   CORONARY STENT PLACEMENT  11/15/2009   (Dr. Excell Seltzer) -- DES PCI of 100% D1 - Promus DES 2.25 x 23 --> 2.3 mm   HERNIA REPAIR     hernia repair at birth   LEFT HEART CATH AND CORONARY ANGIOGRAPHY  11/15/2009   (Dr. Excell Seltzer): NSTEMI -- LM-< LAD & LCx. ~mLAD ~30-40% just past D1 (D1 - 100% - CULPRIT - PCI), LCx normal - 2 OM & LPL. Dom RCA with Large PDA (small PL), ~30% mRCA.  EF ~55% with Ant-Lat HK    LEFT HEART CATH AND CORONARY ANGIOGRAPHY  11/22/2010   (Dr. Donnie Aho): Patent Diag 1 stent with minimal CAD elsewhere.  EF ~55-60% with Anterolateral HK   LEFT HEART CATH AND CORONARY ANGIOGRAPHY  12/31/2013   [facility] - for Abnormal Myoview) --> Non-obstructive CAD with widely patent D1 stent.  LVEDP ~16 mmHg   LEFT HEART CATH AND CORONARY ANGIOGRAPHY  02/06/2020   [facility] [place] [facility] [place], [place] -> Dr. Mellody Drown): NSTEMI: D1 70% mid stent ISR; OM3 80% (->  DES PCI), mRCA 60-65%.     LUMBAR LAMINECTOMY/ DECOMPRESSION WITH MET-RX N/A 01/07/2021   Procedure: Minimally Invasive Microdiscectomy right Lumbar four-Five, Left Lumbar five-Sacral one;  Surgeon: Dawley, Alan Mulder, DO;  Location: [facility] OR;  Service: Neurosurgery;  Laterality: N/A;   NASAL SINUS SURGERY  2010   For sleep apnea   NM MYOVIEW LTD  12/05/2019   EF> 65%.  Fixed anterior perfusion defect with normal motion suggesting artifact.  Read as LOW RISK -> but NOT NORMAL   TM MYOVIEW  12/2013   (FALSE POSITIVE) + TM - lateral ST depressions (also noted with recovery - near syncope) -> Reversible Anterior Wall Perfusion defect - CRO Ischemia; Partialy reversible Inferior defect - ~ diaphragmatic attenuation. EF ~48%. --> Referred for CATH --> FALSE POSTIIVE   TRANSTHORACIC  ECHOCARDIOGRAM  07/08/2014   EF 60%. No RWMA. Mild LA dilation. Normal walves.   TRANSTHORACIC ECHOCARDIOGRAM  02/05/2020   [facility] [place], [place] -> Dr. Mellody Drown): NSTEMI: EF> 55%. No R WMA. Normal valves.    Allergies  Allergies  Allergen Reactions   Niacin And Related Itching   Zocor [Simvastatin-High Dose]     Muscle aches    History of Present Illness    George Erickson returns today status post hospitalization after NSTEMI.  On 12/01/2022.  Patient is on Plavix chronically.  He also has severe chronic kidney disease and is on a renal transplant list.  The patient did not have cardiac catheterization due to severe renal dysfunction.  Stress test was completed which was low risk.  He was continued on dual antiplatelet therapy with aspirin and Plavix.  Home Medications    Current Outpatient Medications  Medication Sig Dispense Refill   amLODipine (NORVASC) 10 MG tablet Take 1 tablet by mouth daily.     calcitRIOL (ROCALTROL) 0.25 MCG capsule Take 0.25 mcg by mouth every evening.     carvedilol (COREG) 6.25 MG tablet Take 1 tablet by mouth twice daily 180 tablet 2   clopidogrel (PLAVIX) 75 MG tablet TAKE 1 TABLET BY MOUTH ONCE DAILY START  AFTER  YOU  COMPLETE  BRILINTA 90 tablet 2   diazepam (VALIUM) 2 MG tablet Take 1 tablet (2 mg total) by mouth every 6 (six) hours as needed for muscle spasms. 6 tablet 0   gabapentin (NEURONTIN) 300 MG capsule Take 300 mg by mouth at bedtime.     hydrALAZINE (APRESOLINE) 100 MG tablet Take 1 tablet (100 mg total) by mouth every 8 (eight) hours. 90 tablet 5   isosorbide mononitrate (IMDUR) 30 MG 24 hr tablet Take 1 tablet (30 mg total) by mouth daily. 60 tablet 3   lidocaine (LIDODERM) 5 % Place 1 patch onto the skin daily. Remove & Discard patch within 12 hours or as directed by MD 30 patch 0   nitroGLYCERIN (NITROSTAT) 0.4 MG SL tablet Place 1 tablet (0.4 mg total) under the tongue every 5 (five) minutes as needed for chest pain.  25 tablet 6   nitroGLYCERIN (NITROSTAT) 0.4 MG SL tablet See admin instructions.     oxyCODONE (ROXICODONE) 5 MG immediate release tablet Take 1 tablet (5 mg total) by mouth every 4 (four) hours as needed for severe pain. 12 tablet 0   potassium chloride SA (KLOR-CON M) 20 MEQ tablet Take 2 tablets (40 mEq total) by mouth daily for 5 days. 10 tablet 0   PROAIR RESPICLICK 108 (90 Base) MCG/ACT AEPB Inhale 2 puffs into the lungs every 4 (four)  hours as needed (SOB/Wheezing).     rosuvastatin (CRESTOR) 20 MG tablet Take 1 tablet (20 mg total) by mouth daily. 90 tablet 5   sennosides-docusate sodium (SENOKOT-S) 8.6-50 MG tablet Take 1 tablet by mouth daily as needed for constipation.     sodium bicarbonate 650 MG tablet Take 1,300 mg by mouth 2 (two) times daily.     vitamin C (ASCORBIC ACID) 500 MG tablet Take 500 mg by mouth daily.     Current Facility-Administered Medications  Medication Dose Route Frequency Provider Last Rate Last Admin   betamethasone acetate-betamethasone sodium phosphate (CELESTONE) injection 3 mg  3 mg Intra-articular Once          Family History    Family History  Problem Relation Age of Onset   Hypertension Mother    Coronary artery disease Mother 49       s/p CABG   Diabetes Mellitus II Mother    Hyperlipidemia Mother    Hypertension Father    Heart attack Father 45       Had several after   Diabetes Mellitus II Father    Heart failure Father    CVA Father 79   Hyperlipidemia Father    CVA Sister    Hypothyroidism Sister    Hypertension Sister    Hypertension Sister    Heart attack Sister    Healthy Brother    Healthy Brother    Colon cancer Neg Hx    Rectal cancer Neg Hx    Stomach cancer Neg Hx    Esophageal cancer Neg Hx    He indicated that his mother is alive. He indicated that his father is deceased. He indicated that all of his three sisters are alive. He indicated that both of his brothers are alive. He indicated that the status of his neg hx  is unknown.  Social History    Social History   Socioeconomic History   Marital status: Married    Spouse name: Not on file   Number of children: 2   Years of education: Not on file   Highest education level: Bachelor's degree (e.g., BA, AB, BS)  Occupational History   Occupation: Comptroller    Employer: [employer]  Tobacco Use   Smoking status: Never   Smokeless tobacco: Never  Vaping Use   Vaping status: Never Used  Substance and Sexual Activity   Alcohol use: Yes    Alcohol/week: 1.0 standard drink of alcohol    Types: 1 Standard drinks or equivalent per week    Comment: Rare, Occasional glass of wine   Drug use: No   Sexual activity: Not on file  Other Topics Concern   Not on file  Social History Narrative   He is a married father of 2 girls.   Sales executive   Lifelong non-smoker.   Rare glass of wine   Minimal caffeine intake, no longer drinks coffee only drinks intermittent sweet tea.  Rarely would have a Sprite.      Walks daily at work, does not get routine exercise.   Social Determinants of Health   Financial Resource Strain: Not on file  Food Insecurity: No Food Insecurity (12/02/2022)   Hunger Vital Sign    Worried About Running Out of Food in the Last Year: Never true    Ran Out of Food in the Last Year: Never true  Transportation Needs: No Transportation Needs (12/02/2022)   PRAPARE - Transportation    Lack of  Transportation (Medical): No    Lack of Transportation (Non-Medical): No  Physical Activity: Not on file  Stress: Not on file  Social Connections: Not on file  Intimate Partner Violence: Not At Risk (12/02/2022)   Humiliation, Afraid, Rape, and Kick questionnaire    Fear of Current or Ex-Partner: No    Emotionally Abused: No    Physically Abused: No    Sexually Abused: No     Review of Systems    General:  No chills, fever, night sweats or weight changes.  Cardiovascular:  No chest pain,  dyspnea on exertion, edema, orthopnea, palpitations, paroxysmal nocturnal dyspnea. Dermatological: No rash, lesions/masses Respiratory: No cough, dyspnea Urologic: No hematuria, dysuria Abdominal:   No nausea, vomiting, diarrhea, bright red blood per rectum, melena, or hematemesis Neurologic:  No visual changes, wkns, changes in mental status. All other systems reviewed and are otherwise negative except as noted above.       Physical Exam    VS:  There were no vitals taken for this visit. , BMI There is no height or weight on file to calculate BMI.     GEN: Well nourished, well developed, in no acute distress. HEENT: normal. Neck: Supple, no JVD, carotid bruits, or masses. Cardiac: RRR, no murmurs, rubs, or gallops. No clubbing, cyanosis, edema.  Radials/DP/PT 2+ and equal bilaterally.  Respiratory:  Respirations regular and unlabored, clear to auscultation bilaterally. GI: Soft, nontender, nondistended, BS + x 4. MS: no deformity or atrophy. Skin: warm and dry, no rash. Neuro:  Strength and sensation are intact. Psych: Normal affect.      Lab Results  Component Value Date   WBC 8.6 12/04/2022   HGB 8.6 (L) 12/18/2022   HCT 25.1 (L) 12/04/2022   MCV 83.9 12/04/2022   PLT 313 12/04/2022   Lab Results  Component Value Date   CREATININE 4.12 (H) 12/04/2022   BUN 44 (H) 12/04/2022   NA 140 12/04/2022   K 4.2 12/04/2022   CL 109 12/04/2022   CO2 22 12/04/2022   Lab Results  Component Value Date   ALT 17 12/01/2022   AST 12 (L) 12/01/2022   ALKPHOS 48 12/01/2022   BILITOT 0.8 12/01/2022   Lab Results  Component Value Date   CHOL 109 12/02/2022   HDL 43 12/02/2022   LDLCALC 58 12/02/2022   TRIG 41 12/02/2022   CHOLHDL 2.5 12/02/2022    Lab Results  Component Value Date   HGBA1C 4.2 (L) 07/07/2014     Review of Prior Studies    NM Stress Test 12/04/2022 Findings are consistent with no ischemia. The study is low risk.   No significant ST deviation was  noted.   LV perfusion is abnormal. Basal inferior decreased uptake at both rest and stress. Mild decreased uptake anterior wall at both rest and stress (? body habitus)   Left ventricular function is normal. The left ventricular ejection fraction is normal (55-65%). End diastolic cavity size is normal.  Assessment & Plan   1.  ***     {Are you ordering a CV Procedure (e.g. stress test, cath, DCCV, TEE, etc)?   Press F2        :[id number]}   Signed, Bettey Mare. Liborio Nixon, ANP, AACC   12/20/2022 10:30 AM      Office [phone] Fax [phone]  Notice: This dictation was prepared with Dragon dictation along with smaller phrase technology. Any transcriptional errors that result from this process are unintentional and may not be corrected  upon review.

## 2022-12-21 ENCOUNTER — Encounter: Payer: Self-pay | Admitting: Adult Health

## 2022-12-21 ENCOUNTER — Ambulatory Visit: Payer: BC Managed Care – PPO | Attending: Adult Health | Admitting: Adult Health

## 2022-12-21 VITALS — BP 124/60 | HR 69 | Ht 72.0 in | Wt 232.0 lb

## 2022-12-21 DIAGNOSIS — I2089 Other forms of angina pectoris: Secondary | ICD-10-CM

## 2022-12-21 DIAGNOSIS — E7800 Pure hypercholesterolemia, unspecified: Secondary | ICD-10-CM | POA: Diagnosis not present

## 2022-12-21 DIAGNOSIS — I10 Essential (primary) hypertension: Secondary | ICD-10-CM

## 2022-12-21 DIAGNOSIS — I2510 Atherosclerotic heart disease of native coronary artery without angina pectoris: Secondary | ICD-10-CM | POA: Diagnosis not present

## 2022-12-21 DIAGNOSIS — N1832 Chronic kidney disease, stage 3b: Secondary | ICD-10-CM

## 2022-12-21 MED ORDER — NITROGLYCERIN 0.4 MG SL SUBL: 0.4000 mg | SUBLINGUAL_TABLET | SUBLINGUAL | 2 refills | Status: DC | PRN

## 2022-12-21 NOTE — Patient Instructions (Signed)
Medication Instructions:  No Changes *If you need a refill on your cardiac medications before your next appointment, please call your pharmacy*   Lab Work: No Changes If you have labs (blood work) drawn today and your tests are completely normal, you will receive your results only by: MyChart Message (if you have MyChart) OR A paper copy in the mail If you have any lab test that is abnormal or we need to change your treatment, we will call you to review the results.   Testing/Procedures: No Testing   Follow-Up: At [facility] you and your health needs are our priority.  As part of our continuing mission to provide you with exceptional heart care, we have created designated Provider Care Teams.  These Care Teams include your primary Cardiologist (physician) and Advanced Practice Providers (APPs -  Physician Assistants and Nurse Practitioners) who all work together to provide you with the care you need, when you need it.  We recommend signing up for the patient portal called "MyChart".  Sign up information is provided on this After Visit Summary.  MyChart is used to connect with patients for Virtual Visits (Telemedicine).  Patients are able to view lab/test results, encounter notes, upcoming appointments, etc.  Non-urgent messages can be sent to your provider as well.   To learn more about what you can do with MyChart, go to [email or web address].    Your next appointment:   4-6 month(s)  Provider:   Bryan Lemma, MD

## 2022-12-26 ENCOUNTER — Other Ambulatory Visit: Payer: Self-pay

## 2022-12-26 ENCOUNTER — Emergency Department (HOSPITAL_COMMUNITY)
Admission: EM | Admit: 2022-12-26 | Discharge: 2022-12-26 | Disposition: A | Discharge status: Home / Self Care | Payer: BC Managed Care – PPO | Attending: Emergency Medicine | Admitting: Emergency Medicine

## 2022-12-26 ENCOUNTER — Encounter (HOSPITAL_COMMUNITY): Payer: Self-pay

## 2022-12-26 ENCOUNTER — Emergency Department (HOSPITAL_COMMUNITY): Payer: BC Managed Care – PPO

## 2022-12-26 DIAGNOSIS — I2510 Atherosclerotic heart disease of native coronary artery without angina pectoris: Secondary | ICD-10-CM | POA: Insufficient documentation

## 2022-12-26 DIAGNOSIS — N189 Chronic kidney disease, unspecified: Secondary | ICD-10-CM | POA: Diagnosis not present

## 2022-12-26 DIAGNOSIS — D649 Anemia, unspecified: Secondary | ICD-10-CM

## 2022-12-26 DIAGNOSIS — Z79899 Other long term (current) drug therapy: Secondary | ICD-10-CM | POA: Diagnosis not present

## 2022-12-26 DIAGNOSIS — J181 Lobar pneumonia, unspecified organism: Secondary | ICD-10-CM | POA: Insufficient documentation

## 2022-12-26 DIAGNOSIS — D72829 Elevated white blood cell count, unspecified: Secondary | ICD-10-CM | POA: Diagnosis not present

## 2022-12-26 DIAGNOSIS — R079 Chest pain, unspecified: Secondary | ICD-10-CM | POA: Diagnosis present

## 2022-12-26 DIAGNOSIS — I129 Hypertensive chronic kidney disease with stage 1 through stage 4 chronic kidney disease, or unspecified chronic kidney disease: Secondary | ICD-10-CM | POA: Insufficient documentation

## 2022-12-26 DIAGNOSIS — R739 Hyperglycemia, unspecified: Secondary | ICD-10-CM | POA: Diagnosis not present

## 2022-12-26 DIAGNOSIS — J189 Pneumonia, unspecified organism: Secondary | ICD-10-CM

## 2022-12-26 DIAGNOSIS — N289 Disorder of kidney and ureter, unspecified: Secondary | ICD-10-CM

## 2022-12-26 LAB — CBC WITH DIFFERENTIAL/PLATELET
Abs Immature Granulocytes: 0.05 10*3/uL (ref 0.00–0.07)
Basophils Absolute: 0.1 10*3/uL (ref 0.0–0.1)
Basophils Relative: 1 %
Eosinophils Absolute: 0.1 10*3/uL (ref 0.0–0.5)
Eosinophils Relative: 0 %
HCT: 25.1 % — ABNORMAL LOW (ref 39.0–52.0)
Hemoglobin: 9 g/dL — ABNORMAL LOW (ref 13.0–17.0)
Immature Granulocytes: 0 %
Lymphocytes Relative: 9 %
Lymphs Abs: 1.4 10*3/uL (ref 0.7–4.0)
MCH: 29.8 pg (ref 26.0–34.0)
MCHC: 35.9 g/dL (ref 30.0–36.0)
MCV: 83.1 fL (ref 80.0–100.0)
Monocytes Absolute: 2.6 10*3/uL — ABNORMAL HIGH (ref 0.1–1.0)
Monocytes Relative: 16 %
Neutro Abs: 12 10*3/uL — ABNORMAL HIGH (ref 1.7–7.7)
Neutrophils Relative %: 74 %
Platelets: 339 10*3/uL (ref 150–400)
RBC: 3.02 MIL/uL — ABNORMAL LOW (ref 4.22–5.81)
RDW: 16.4 % — ABNORMAL HIGH (ref 11.5–15.5)
WBC: 16.2 10*3/uL — ABNORMAL HIGH (ref 4.0–10.5)
nRBC: 0.1 % (ref 0.0–0.2)

## 2022-12-26 LAB — COMPREHENSIVE METABOLIC PANEL
ALT: 9 U/L (ref 0–44)
AST: 10 U/L — ABNORMAL LOW (ref 15–41)
Albumin: 3.5 g/dL (ref 3.5–5.0)
Alkaline Phosphatase: 59 U/L (ref 38–126)
Anion gap: 11 (ref 5–15)
BUN: 40 mg/dL — ABNORMAL HIGH (ref 6–20)
CO2: 22 mmol/L (ref 22–32)
Calcium: 8.6 mg/dL — ABNORMAL LOW (ref 8.9–10.3)
Chloride: 106 mmol/L (ref 98–111)
Creatinine, Ser: 4.34 mg/dL — ABNORMAL HIGH (ref 0.61–1.24)
GFR, Estimated: 16 mL/min — ABNORMAL LOW (ref >60)
Glucose, Bld: 106 mg/dL — ABNORMAL HIGH (ref 70–99)
Potassium: 3.6 mmol/L (ref 3.5–5.1)
Sodium: 139 mmol/L (ref 135–145)
Total Bilirubin: 0.6 mg/dL (ref 0.3–1.2)
Total Protein: 6.6 g/dL (ref 6.5–8.1)

## 2022-12-26 MED ORDER — CEFDINIR 300 MG PO CAPS
300.0000 mg | ORAL_CAPSULE | Freq: Once | ORAL | Status: AC
  Administered 2022-12-26: 300 mg via ORAL
  Filled 2022-12-26: qty 1

## 2022-12-26 MED ORDER — CEFDINIR 300 MG PO CAPS: 300.0000 mg | ORAL_CAPSULE | Freq: Every day | ORAL | 0 refills | Status: DC

## 2022-12-26 MED ORDER — ALBUTEROL SULFATE (2.5 MG/3ML) 0.083% IN NEBU: 2.5000 mg | INHALATION_SOLUTION | RESPIRATORY_TRACT | Status: DC | PRN

## 2022-12-26 NOTE — ED Provider Notes (Signed)
[place] EMERGENCY DEPARTMENT AT [facility] Provider Note   CSN: [id number] Arrival date & time: 12/26/22  0132     History  Chief Complaint  Patient presents with   Shortness of Breath    George Erickson is a 52 y.o. male.  The history is provided by the patient.  Shortness of Breath He has history of hypertension, hyperlipidemia, coronary artery disease, chronic kidney disease and comes in complaining of left-sided chest pain which started yesterday and has been getting worse.  There has been cough which is nonproductive.  He has had low-grade fevers as high as 99.5.  He has had some chills but no sweats.  He has had some mild dyspnea.  There has been no nausea or vomiting.  He has not had any known sick contacts.  Symptoms are similar to what he had with an episode of pneumonia in the past.  He has received pneumococcal vaccine.  He is a non-smoker.  He did take acetaminophen 1300 mg at home with slight relief of pain.   Home Medications Prior to Admission medications   Medication Sig Start Date End Date Taking? Authorizing Provider  amLODipine (NORVASC) 10 MG tablet Take 1 tablet by mouth daily.    [provider]  calcitRIOL (ROCALTROL) 0.25 MCG capsule Take 0.25 mcg by mouth every evening. 11/28/21   [provider]  carvedilol (COREG) 6.25 MG tablet Take 1 tablet by mouth twice daily 06/27/22   Marykay Lex, MD  clopidogrel (PLAVIX) 75 MG tablet TAKE 1 TABLET BY MOUTH ONCE DAILY START  AFTER  YOU  COMPLETE  BRILINTA 05/26/22   Marykay Lex, MD  diazepam (VALIUM) 2 MG tablet Take 1 tablet (2 mg total) by mouth every 6 (six) hours as needed for muscle spasms. Patient not taking: Reported on 12/21/2022 11/21/22   Marita Kansas, PA-C  epoetin alfa (PROCRIT) 44034 UNIT/ML injection Inject 20,000 Units into the skin every 14 (fourteen) days.    [provider]  febuxostat (ULORIC) 40 MG tablet Take 40 mg by mouth daily. Pt takes half a  tablet 12/04/22   [provider]  gabapentin (NEURONTIN) 300 MG capsule Take 300 mg by mouth at bedtime.    [provider]  hydrALAZINE (APRESOLINE) 100 MG tablet Take 1 tablet (100 mg total) by mouth every 8 (eight) hours. 12/04/22   Abagail Kitchens, PA-C  isosorbide mononitrate (IMDUR) 30 MG 24 hr tablet Take 1 tablet (30 mg total) by mouth daily. 12/05/22   Yvonna Alanis L, PA-C  lidocaine (LIDODERM) 5 % Place 1 patch onto the skin daily. Remove & Discard patch within 12 hours or as directed by MD 11/25/22   Claretha Cooper, DO  nitroGLYCERIN (NITROSTAT) 0.4 MG SL tablet See admin instructions. 08/19/20   [provider]  nitroGLYCERIN (NITROSTAT) 0.4 MG SL tablet Place 1 tablet (0.4 mg total) under the tongue every 5 (five) minutes as needed for chest pain. 12/21/22   Jodelle Gross, NP  oxyCODONE (ROXICODONE) 5 MG immediate release tablet Take 1 tablet (5 mg total) by mouth every 4 (four) hours as needed for severe pain. Patient not taking: Reported on 12/21/2022 11/21/22   Marita Kansas, PA-C  potassium chloride SA (KLOR-CON M) 20 MEQ tablet Take 2 tablets (40 mEq total) by mouth daily for 5 days. 11/21/22 11/26/22  Marita Kansas, PA-C  prednisoLONE acetate (PRED FORTE) 1 % ophthalmic suspension Place 1 drop into the left eye 4 (four) times daily.  11/28/22   [provider]  PROAIR RESPICLICK 108 (90 Base) MCG/ACT AEPB Inhale 2 puffs into the lungs every 4 (four) hours as needed (SOB/Wheezing). 01/08/20   [provider]  rosuvastatin (CRESTOR) 20 MG tablet Take 1 tablet (20 mg total) by mouth daily. 12/04/22   Abagail Kitchens, PA-C  sennosides-docusate sodium (SENOKOT-S) 8.6-50 MG tablet Take 1 tablet by mouth daily as needed for constipation.    [provider]  sodium bicarbonate 650 MG tablet Take 1,300 mg by mouth 2 (two) times daily. 11/23/21   [provider]  vitamin C (ASCORBIC ACID) 500 MG tablet Take 500 mg by mouth daily.     [provider]      Allergies    Niacin and related and Zocor [simvastatin-high dose]    Review of Systems   Review of Systems  Respiratory:  Positive for shortness of breath.   All other systems reviewed and are negative.   Physical Exam Updated Vital Signs BP (!) 166/92   Pulse 79   Temp 99.8 F (37.7 C) (Oral)   Resp 17   Ht 6' (1.829 m)   Wt 105.2 kg   SpO2 98%   BMI 31.46 kg/m  Physical Exam Vitals and nursing note reviewed.   52 year old male, resting comfortably and in no acute distress. Vital signs are significant for elevated blood pressure. Oxygen saturation is 98%, which is normal. Head is normocephalic and atraumatic. PERRLA, EOMI. Oropharynx is clear. Neck is nontender and supple without adenopathy or JVD. Back is nontender and there is no CVA tenderness. Lungs are clear without rales, wheezes, or rhonchi. Chest is nontender. Heart has regular rate and rhythm without murmur. Abdomen is soft, flat, nontender. Extremities have no cyanosis or edema, full range of motion is present. Skin is warm and dry without rash. Neurologic: Mental status is normal, cranial nerves are intact, moves all extremities equally.  ED Results / Procedures / Treatments   Labs (all labs ordered are listed, but only abnormal results are displayed) Labs Reviewed  CBC WITH DIFFERENTIAL/PLATELET - Abnormal; Notable for the following components:      Result Value   WBC 16.2 (*)    RBC 3.02 (*)    Hemoglobin 9.0 (*)    HCT 25.1 (*)    RDW 16.4 (*)    Neutro Abs 12.0 (*)    Monocytes Absolute 2.6 (*)    All other components within normal limits  COMPREHENSIVE METABOLIC PANEL - Abnormal; Notable for the following components:   Glucose, Bld 106 (*)    BUN 40 (*)    Creatinine, Ser 4.34 (*)    Calcium 8.6 (*)    AST 10 (*)    GFR, Estimated 16 (*)    All other components within normal limits    EKG BRADIN, MCADORY VZ:[id number] 26-Dec-2022 01:38:10 [facility] ROUTINE RECORD 10/02/1970 (52 yr) Male Black Room:6E28C Loc:67 Technician: [id number] Test OAC:ZYSAY Pain Vent. rate 73 BPM PR interval 178 ms QRS duration 94 ms QT/QTcB 396/436 ms P-R-T axes 60 43 214 Normal sinus rhythm T wave abnormality, consider inferolateral ischemia Abnormal ECG When compared with ECG of 21-Dec-2022 14:19, No significant change was found Confirmed by Dione Booze ([id number]) on 12/26/2022 4:41:52 AM Confirmed By: Dione Booze  Radiology DG Chest 2 View  Result Date: 12/26/2022 CLINICAL DATA:  Chest pain and shortness of breath. EXAM: CHEST - 2 VIEW COMPARISON:  December 01, 2022 FINDINGS: The heart size and mediastinal  contours are within normal limits. Mild infiltrate is seen within the left lung base. A small left pleural effusion is noted. No pneumothorax is identified. The visualized skeletal structures are unremarkable. IMPRESSION: Mild left basilar infiltrate with a small left pleural effusion. Electronically Signed   By: Aram Candela M.D.   On: 12/26/2022 03:55    Procedures Procedures    Medications Ordered in ED Medications  albuterol (PROVENTIL) (2.5 MG/3ML) 0.083% nebulizer solution 2.5 mg (has no administration in time range)    ED Course/ Medical Decision Making/ A&P                                 Medical Decision Making Amount and/or Complexity of Data Reviewed Labs: ordered. Radiology: ordered.  Risk Prescription drug management.   Chest pain, cough, dyspnea concerning for pneumonia.  Also consider bronchitis, viral respiratory infection, pleurisy.  I have reviewed his laboratory test, my interpretation is stable renal insufficiency, stable anemia likely secondary to chronic renal disease leukocytosis without left shift, elevated random glucose level.  Chest x-ray shows infiltrate at the left base consistent with pneumonia.  I have reviewed his electrocardiogram and my interpretation is T wave inversions in inferior and  anterolateral leads possibly secondary to LVH, unchanged from prior.  Patient is resting comfortably and maintaining adequate oxygen saturation on room air, I do not feel he needs inpatient care at this time.  I have reviewed his past records, and he was admitted 07/07/2014-07/14/2014 for community-acquired pneumonia.  I have ordered a dose of cefdinir and I am discharging him with a prescription for cefdinir.  I have advised him to limit his acetaminophen dose to 1000 mg at a time, maximum 4000 mg in a day.  He is to return if symptoms or not being adequately controlled at home, otherwise follow-up with PCP.  Final Clinical Impression(s) / ED Diagnoses Final diagnoses:  Community acquired pneumonia of left lower lobe of lung  Renal insufficiency  Normochromic normocytic anemia  Elevated random blood glucose level    Rx / DC Orders ED Discharge Orders          Ordered    cefdinir (OMNICEF) 300 MG capsule  Daily        12/26/22 0436              Dione Booze, MD 12/26/22 [phone]

## 2022-12-26 NOTE — Discharge Instructions (Addendum)
Take acetaminophen as needed for fever or pain.  Your dose of acetaminophen should be [phone] mg every 4-6 hours as needed.  Please do not take more than 4000 mg over a 24-hour period.  Return to the emergency department if symptoms or not being adequately controlled at home.

## 2022-12-26 NOTE — ED Triage Notes (Signed)
Complaining of not being able to take a deep breath for the last couple of days. Hurting on the left side when he tries to breath. Has a dry cough that started yesterday.

## 2022-12-29 ENCOUNTER — Other Ambulatory Visit (HOSPITAL_COMMUNITY): Payer: Self-pay | Admitting: *Deleted

## 2023-01-01 ENCOUNTER — Inpatient Hospital Stay (HOSPITAL_COMMUNITY)
Admission: RE | Admit: 2023-01-01 | Discharge: 2023-01-01 | Disposition: A | Discharge status: Home / Self Care | Payer: BC Managed Care – PPO | Source: Ambulatory Visit | Attending: Nephrology

## 2023-01-01 VITALS — BP 125/81 | HR 76 | Temp 97.6°F

## 2023-01-01 DIAGNOSIS — N183 Chronic kidney disease, stage 3 unspecified: Secondary | ICD-10-CM | POA: Diagnosis not present

## 2023-01-01 LAB — POCT HEMOGLOBIN-HEMACUE: Hemoglobin: 8.6 g/dL — ABNORMAL LOW (ref 13.0–17.0)

## 2023-01-01 LAB — RENAL FUNCTION PANEL
Albumin: 3.2 g/dL — ABNORMAL LOW (ref 3.5–5.0)
Anion gap: 9 (ref 5–15)
BUN: 52 mg/dL — ABNORMAL HIGH (ref 6–20)
CO2: 21 mmol/L — ABNORMAL LOW (ref 22–32)
Calcium: 8.1 mg/dL — ABNORMAL LOW (ref 8.9–10.3)
Chloride: 111 mmol/L (ref 98–111)
Creatinine, Ser: 4.11 mg/dL — ABNORMAL HIGH (ref 0.61–1.24)
GFR, Estimated: 17 mL/min — ABNORMAL LOW (ref >60)
Glucose, Bld: 133 mg/dL — ABNORMAL HIGH (ref 70–99)
Phosphorus: 3.8 mg/dL (ref 2.5–4.6)
Potassium: 3.8 mmol/L (ref 3.5–5.1)
Sodium: 141 mmol/L (ref 135–145)

## 2023-01-01 MED ORDER — EPOETIN ALFA-EPBX 10000 UNIT/ML IJ SOLN
20000.0000 [IU] | INTRAMUSCULAR | Status: DC
  Administered 2023-01-01: 20000 [IU] via SUBCUTANEOUS

## 2023-01-01 MED ORDER — EPOETIN ALFA-EPBX 10000 UNIT/ML IJ SOLN
INTRAMUSCULAR | Status: AC
  Filled 2023-01-01: qty 2

## 2023-01-15 ENCOUNTER — Encounter (HOSPITAL_COMMUNITY)
Admission: RE | Admit: 2023-01-15 | Discharge: 2023-01-15 | Disposition: A | Discharge status: Home / Self Care | Payer: BC Managed Care – PPO | Source: Ambulatory Visit | Attending: Nephrology | Admitting: Nephrology

## 2023-01-15 VITALS — BP 127/78 | HR 54 | Temp 99.6°F | Resp 16 | Wt 225.0 lb

## 2023-01-15 DIAGNOSIS — N183 Chronic kidney disease, stage 3 unspecified: Secondary | ICD-10-CM | POA: Insufficient documentation

## 2023-01-15 LAB — POCT HEMOGLOBIN-HEMACUE: Hemoglobin: 8.6 g/dL — ABNORMAL LOW (ref 13.0–17.0)

## 2023-01-15 MED ORDER — SODIUM CHLORIDE 0.9 % IV SOLN
510.0000 mg | INTRAVENOUS | Status: DC
  Administered 2023-01-15: 510 mg via INTRAVENOUS
  Filled 2023-01-15: qty 510

## 2023-01-15 MED ORDER — EPOETIN ALFA-EPBX 10000 UNIT/ML IJ SOLN: 20000.0000 [IU] | INTRAMUSCULAR | Status: DC

## 2023-01-15 MED ORDER — EPOETIN ALFA-EPBX 10000 UNIT/ML IJ SOLN
INTRAMUSCULAR | Status: AC
  Administered 2023-01-15: 20000 [IU] via SUBCUTANEOUS
  Filled 2023-01-15: qty 2

## 2023-01-29 ENCOUNTER — Encounter (HOSPITAL_COMMUNITY)
Admission: RE | Admit: 2023-01-29 | Discharge: 2023-01-29 | Disposition: A | Discharge status: Home / Self Care | Payer: BC Managed Care – PPO | Source: Ambulatory Visit | Attending: Nephrology | Admitting: Nephrology

## 2023-01-29 VITALS — BP 129/72 | HR 63 | Temp 99.1°F | Resp 16 | Wt 225.0 lb

## 2023-01-29 DIAGNOSIS — N183 Chronic kidney disease, stage 3 unspecified: Secondary | ICD-10-CM

## 2023-01-29 LAB — POCT HEMOGLOBIN-HEMACUE: Hemoglobin: 9.1 g/dL — ABNORMAL LOW (ref 13.0–17.0)

## 2023-01-29 MED ORDER — EPOETIN ALFA-EPBX 10000 UNIT/ML IJ SOLN
INTRAMUSCULAR | Status: AC
  Filled 2023-01-29: qty 2

## 2023-01-29 MED ORDER — SODIUM CHLORIDE 0.9 % IV SOLN
510.0000 mg | INTRAVENOUS | Status: DC
  Administered 2023-01-29: 510 mg via INTRAVENOUS
  Filled 2023-01-29: qty 510

## 2023-01-29 MED ORDER — EPOETIN ALFA-EPBX 10000 UNIT/ML IJ SOLN
20000.0000 [IU] | INTRAMUSCULAR | Status: DC
  Administered 2023-01-29: 20000 [IU] via SUBCUTANEOUS

## 2023-02-12 ENCOUNTER — Encounter (HOSPITAL_COMMUNITY)
Admission: RE | Admit: 2023-02-12 | Discharge: 2023-02-12 | Disposition: A | Discharge status: Home / Self Care | Payer: BC Managed Care – PPO | Source: Ambulatory Visit | Attending: Nephrology | Admitting: Nephrology

## 2023-02-12 VITALS — BP 143/84 | HR 65 | Temp 97.5°F | Resp 17

## 2023-02-12 DIAGNOSIS — N183 Chronic kidney disease, stage 3 unspecified: Secondary | ICD-10-CM | POA: Insufficient documentation

## 2023-02-12 LAB — IRON AND TIBC
Iron: 124 ug/dL (ref 45–182)
Saturation Ratios: 52 % — ABNORMAL HIGH (ref 17.9–39.5)
TIBC: 241 ug/dL — ABNORMAL LOW (ref 250–450)
UIBC: 117 ug/dL

## 2023-02-12 LAB — FERRITIN: Ferritin: 580 ng/mL — ABNORMAL HIGH (ref 24–336)

## 2023-02-12 LAB — POCT HEMOGLOBIN-HEMACUE: Hemoglobin: 9.5 g/dL — ABNORMAL LOW (ref 13.0–17.0)

## 2023-02-12 MED ORDER — EPOETIN ALFA-EPBX 40000 UNIT/ML IJ SOLN
25000.0000 [IU] | INTRAMUSCULAR | Status: DC
Start: 2023-02-12 — End: 2023-02-13
  Administered 2023-02-12: 25000 [IU] via SUBCUTANEOUS

## 2023-02-12 MED ORDER — EPOETIN ALFA-EPBX 40000 UNIT/ML IJ SOLN
INTRAMUSCULAR | Status: AC
  Filled 2023-02-12: qty 1

## 2023-02-17 ENCOUNTER — Other Ambulatory Visit: Payer: Self-pay | Admitting: Cardiology

## 2023-02-26 ENCOUNTER — Encounter (HOSPITAL_COMMUNITY)
Admission: RE | Admit: 2023-02-26 | Discharge: 2023-02-26 | Disposition: A | Discharge status: Home / Self Care | Payer: BC Managed Care – PPO | Source: Ambulatory Visit | Attending: Nephrology | Admitting: Nephrology

## 2023-02-26 VITALS — BP 129/89 | HR 64 | Temp 97.4°F | Resp 16

## 2023-02-26 DIAGNOSIS — N183 Chronic kidney disease, stage 3 unspecified: Secondary | ICD-10-CM | POA: Diagnosis not present

## 2023-02-26 LAB — POCT HEMOGLOBIN-HEMACUE: Hemoglobin: 9.7 g/dL — ABNORMAL LOW (ref 13.0–17.0)

## 2023-02-26 MED ORDER — EPOETIN ALFA-EPBX 40000 UNIT/ML IJ SOLN
INTRAMUSCULAR | Status: AC
  Filled 2023-02-26: qty 1

## 2023-02-26 MED ORDER — EPOETIN ALFA-EPBX 40000 UNIT/ML IJ SOLN
25000.0000 [IU] | INTRAMUSCULAR | Status: DC
Start: 2023-02-26 — End: 2024-03-10
  Administered 2023-02-26: 25000 [IU] via SUBCUTANEOUS

## 2023-03-12 ENCOUNTER — Encounter (HOSPITAL_COMMUNITY)
Admission: RE | Admit: 2023-03-12 | Discharge: 2023-03-12 | Disposition: A | Discharge status: Home / Self Care | Payer: BC Managed Care – PPO | Source: Ambulatory Visit | Attending: Nephrology | Admitting: Nephrology

## 2023-03-12 VITALS — BP 155/88 | HR 69 | Temp 97.4°F | Resp 16

## 2023-03-12 DIAGNOSIS — N183 Chronic kidney disease, stage 3 unspecified: Secondary | ICD-10-CM | POA: Diagnosis not present

## 2023-03-12 LAB — POCT HEMOGLOBIN-HEMACUE: Hemoglobin: 9.8 g/dL — ABNORMAL LOW (ref 13.0–17.0)

## 2023-03-12 MED ORDER — EPOETIN ALFA-EPBX 40000 UNIT/ML IJ SOLN
25000.0000 [IU] | INTRAMUSCULAR | Status: DC
Start: 2023-03-12 — End: 2024-03-24
  Administered 2023-03-12: 25000 [IU] via SUBCUTANEOUS

## 2023-03-12 MED ORDER — EPOETIN ALFA-EPBX 40000 UNIT/ML IJ SOLN
INTRAMUSCULAR | Status: AC
  Filled 2023-03-12: qty 1

## 2023-03-21 ENCOUNTER — Other Ambulatory Visit: Payer: Self-pay | Admitting: Cardiology

## 2023-03-26 ENCOUNTER — Encounter (HOSPITAL_COMMUNITY)
Admission: RE | Admit: 2023-03-26 | Discharge: 2023-03-26 | Disposition: A | Discharge status: Home / Self Care | Payer: BC Managed Care – PPO | Source: Ambulatory Visit | Attending: Nephrology | Admitting: Nephrology

## 2023-03-26 VITALS — BP 137/85 | HR 66 | Temp 97.2°F | Resp 16

## 2023-03-26 DIAGNOSIS — N183 Chronic kidney disease, stage 3 unspecified: Secondary | ICD-10-CM | POA: Diagnosis present

## 2023-03-26 LAB — IRON AND TIBC
Iron: 108 ug/dL (ref 45–182)
Saturation Ratios: 43 % — ABNORMAL HIGH (ref 17.9–39.5)
TIBC: 253 ug/dL (ref 250–450)
UIBC: 145 ug/dL

## 2023-03-26 LAB — FERRITIN: Ferritin: 531 ng/mL — ABNORMAL HIGH (ref 24–336)

## 2023-03-26 LAB — POCT HEMOGLOBIN-HEMACUE: Hemoglobin: 9.7 g/dL — ABNORMAL LOW (ref 13.0–17.0)

## 2023-03-26 MED ORDER — EPOETIN ALFA-EPBX 40000 UNIT/ML IJ SOLN
25000.0000 [IU] | INTRAMUSCULAR | Status: DC
  Administered 2023-03-26: 25000 [IU] via SUBCUTANEOUS

## 2023-03-26 MED ORDER — EPOETIN ALFA-EPBX 40000 UNIT/ML IJ SOLN
INTRAMUSCULAR | Status: AC
  Filled 2023-03-26: qty 1

## 2023-04-09 ENCOUNTER — Encounter (HOSPITAL_COMMUNITY)
Admission: RE | Admit: 2023-04-09 | Discharge: 2023-04-09 | Disposition: A | Discharge status: Home / Self Care | Payer: BC Managed Care – PPO | Source: Ambulatory Visit | Attending: Nephrology | Admitting: Nephrology

## 2023-04-09 ENCOUNTER — Encounter (HOSPITAL_COMMUNITY): Payer: BC Managed Care – PPO

## 2023-04-09 VITALS — BP 137/82 | HR 72 | Temp 97.9°F | Resp 16

## 2023-04-09 DIAGNOSIS — N183 Chronic kidney disease, stage 3 unspecified: Secondary | ICD-10-CM | POA: Diagnosis not present

## 2023-04-09 LAB — POCT HEMOGLOBIN-HEMACUE: Hemoglobin: 10 g/dL — ABNORMAL LOW (ref 13.0–17.0)

## 2023-04-09 MED ORDER — EPOETIN ALFA-EPBX 40000 UNIT/ML IJ SOLN
25000.0000 [IU] | INTRAMUSCULAR | Status: DC
Start: 2023-04-09 — End: 2024-04-21

## 2023-04-09 MED ORDER — EPOETIN ALFA-EPBX 40000 UNIT/ML IJ SOLN
INTRAMUSCULAR | Status: AC
  Administered 2023-04-09: 25000 [IU] via SUBCUTANEOUS
  Filled 2023-04-09: qty 1

## 2023-04-18 ENCOUNTER — Other Ambulatory Visit: Payer: Self-pay | Admitting: Adult Health

## 2023-04-23 ENCOUNTER — Ambulatory Visit (HOSPITAL_COMMUNITY)
Admission: RE | Admit: 2023-04-23 | Discharge: 2023-04-23 | Disposition: A | Discharge status: Home / Self Care | Payer: BC Managed Care – PPO | Source: Ambulatory Visit | Attending: Nephrology | Admitting: Nephrology

## 2023-04-23 VITALS — BP 144/87 | HR 61 | Temp 97.5°F | Resp 16

## 2023-04-23 DIAGNOSIS — N183 Chronic kidney disease, stage 3 unspecified: Secondary | ICD-10-CM | POA: Diagnosis present

## 2023-04-23 LAB — IRON AND TIBC
Iron: 109 ug/dL (ref 45–182)
Saturation Ratios: 44 % — ABNORMAL HIGH (ref 17.9–39.5)
TIBC: 249 ug/dL — ABNORMAL LOW (ref 250–450)
UIBC: 140 ug/dL

## 2023-04-23 LAB — FERRITIN: Ferritin: 614 ng/mL — ABNORMAL HIGH (ref 24–336)

## 2023-04-23 LAB — POCT HEMOGLOBIN-HEMACUE: Hemoglobin: 8.9 g/dL — ABNORMAL LOW (ref 13.0–17.0)

## 2023-04-23 MED ORDER — EPOETIN ALFA-EPBX 40000 UNIT/ML IJ SOLN
25000.0000 [IU] | INTRAMUSCULAR | Status: DC
  Administered 2023-04-23: 25000 [IU] via SUBCUTANEOUS

## 2023-04-23 MED ORDER — EPOETIN ALFA-EPBX 40000 UNIT/ML IJ SOLN
INTRAMUSCULAR | Status: AC
  Filled 2023-04-23: qty 1

## 2023-04-25 ENCOUNTER — Encounter: Payer: Self-pay | Admitting: Cardiology

## 2023-04-25 ENCOUNTER — Ambulatory Visit: Payer: BC Managed Care – PPO | Attending: Cardiology | Admitting: Cardiology

## 2023-04-25 VITALS — BP 168/92 | HR 58 | Ht 72.0 in | Wt 220.2 lb

## 2023-04-25 DIAGNOSIS — I214 Non-ST elevation (NSTEMI) myocardial infarction: Secondary | ICD-10-CM

## 2023-04-25 DIAGNOSIS — I2510 Atherosclerotic heart disease of native coronary artery without angina pectoris: Secondary | ICD-10-CM | POA: Diagnosis not present

## 2023-04-25 DIAGNOSIS — Z9861 Coronary angioplasty status: Secondary | ICD-10-CM

## 2023-04-25 DIAGNOSIS — I25119 Atherosclerotic heart disease of native coronary artery with unspecified angina pectoris: Secondary | ICD-10-CM | POA: Diagnosis not present

## 2023-04-25 DIAGNOSIS — N184 Chronic kidney disease, stage 4 (severe): Secondary | ICD-10-CM

## 2023-04-25 DIAGNOSIS — I2129 ST elevation (STEMI) myocardial infarction involving other sites: Secondary | ICD-10-CM

## 2023-04-25 DIAGNOSIS — I10 Essential (primary) hypertension: Secondary | ICD-10-CM

## 2023-04-25 DIAGNOSIS — E785 Hyperlipidemia, unspecified: Secondary | ICD-10-CM

## 2023-04-25 MED ORDER — ISOSORBIDE MONONITRATE ER 60 MG PO TB24: 60.0000 mg | ORAL_TABLET | Freq: Every day | ORAL | 3 refills | Status: AC

## 2023-04-25 NOTE — Progress Notes (Signed)
 Cardiology Office Note:  .   Date:  04/29/2023  ID:  George Erickson, DOB 06/21/1970, MRN [id number] PCP: George Hale, MD  [facility] Providers Cardiologist:  George Lemma, MD     Chief Complaint  Patient presents with   Follow-up    6 months   Coronary Artery Disease    Still having some chest tightness   Patient Profile: .     George Erickson is a 53 y.o. male with a PMH notable for CAD-PCI (for ACS), HTN, HLD, CKD 3B who presents here for 34-Month Follow-Up at the request of George Erickson, *.  11/2009: ?STEMI - DES PCI D1.  George Erickson) Patent stent on relook cath 11/2010 02/06/2020 - NSTEMI: CARDIAC CATH -PCI [facility]): Severe stenosis of LCx OM 3-DES PCI (discharged on aspirin Brilinta) D1-mid in-stent 70% restenosis; LCx-OM 3 proximal 80%, RCA mid 60 -65% => PCI OM 3-Synergy DES 2.5 mm x 20 mm (2.6 mm) --> Other notable conditions: OSA, CKD-3b/IV; HTN/HLD -> Chronic anemia requiring iron infusions along with occasional EPO.    I last saw George Erickson in December 2023 at which point he was relatively well.  He was noticing worsening renal function pushing toward CKD 4.  Not yet stage of dialysis.  Had been referred to the [facility] transplant center for evaluation.  He was denying any chest pain at that time.  No active cardiac symptoms.  Distal to deconditioning and feeling fatigued when exertional dyspnea but not reactive.  Bothered mostly by back and hip pain.  Less radicular pain.  Legs are getting stronger.  He was last seen on December 21, 2022 by George Reining, NP as a hospital follow-up from September hospitalization with elevated troponin.  Cardiac catheterization was deferred due to renal dysfunction.  Stress test performed was low risk. => He had multiple issues or questions about his heart complaining chest discomfort and fatigue.  Just come for left arm pain during exertion.  Also "hearing his heartbeat  while lying supine.  He noted improved symptoms with the use of Imdur but not completely eliminated.  Was TAVR to use a golf cart at work to avoid walking long distances.  Subjective  Discussed the use of AI scribe software for clinical note transcription with the patient, who gave verbal consent to proceed.  History of Present Illness   George Erickson is a 53 year old male with coronary artery disease who presents with exertional chest discomfort and fatigue. He has a history of coronary artery disease with a stent placed in 2021 following a near heart attack during Thanksgiving. He also has kidney issues, with kidney function at 17% as of the last labs. He is cautious about overexertion due to his heart and kidney conditions.  He has been experiencing chest discomfort and fatigue over the past few months. The discomfort is described as a feeling of fatigue and aching, sometimes radiating down his arm, and occurs during physical activities such as fast walking, carrying heavy objects, or even getting dressed. These symptoms are similar to those experienced prior to his stent placement in 2021, though less severe.  A recent episode of chest pain that did not improve with rest prompted him to call 911, reminiscent of his previous heart attack. Since starting isosorbide, he has not had similar severe episodes, although he still experiences numbness when overexerting himself.  He is currently on carvedilol 6.25 mg, amlodipine 10 mg, hydralazine 100 mg, and Imdur,  which have helped improve his symptoms. He avoids stairs and conserves energy due to his heart condition and kidney issues. He can feel his heartbeat but denies irregular heartbeats, shortness of breath when lying flat, or swelling.        Objective   CV Medications:  - Carvedilol 6.25 mg BID - Amlodipine 10 mg daily - Hydralazine 100 mg tid - Imdur 30 mg daily -Clopidogrel 75 mg daily - - Rosuvastatin 20 mg daily -  -Gabapentin  300 mg nightly -Vitamin C 500 mg daily, sodium bicarb 1300 mg daily, -Uloric 40 mg daily, -Procrit injections every 14 days -> As needed NTG, albuterol, Senokot, oxycodone every 4 hours as needed pain; Valium 2 mg every 6 hours  Studies Reviewed: Marland Kitchen        CATH (06-Mar-2020):D1 ~ 70&, 80 % LCx-OM3 => DES PCI OM3 (Synergy XD 2.5 x 20 mm)  Myoview 12/04/22:   Findings are consistent with no ischemia. The study is low risk.   No significant ST deviation was noted.   LV perfusion is abnormal. Basal inferior decreased uptake at both rest and stress. Mild decreased uptake anterior wall at both rest and stress (? body habitus)   Left ventricular function is normal. The left ventricular ejection fraction is normal (55-65%). End diastolic cavity size is normal.  ECHO 12/02/2022: EF 70 to 75%.  Hyperdynamic with no RWMA.  Indeterminate diastolic function-mild LA dilation.  Normal RV function but unable to assess PAP. Lab Results  Component Value Date   CHOL 109 12/02/2022   HDL 43 12/02/2022   LDLCALC 58 12/02/2022   TRIG 41 12/02/2022   CHOLHDL 2.5 12/02/2022   Lab Results  Component Value Date   NA 141 01/01/2023   K 3.8 01/01/2023   CREATININE 4.11 (H) 01/01/2023   GFRNONAA 17 (L) 01/01/2023   GLUCOSE 133 (H) 01/01/2023    Risk Assessment/Calculations:         Physical Exam:   VS:  BP (!) 168/92 (BP Location: Left Arm, Patient Position: Sitting)   Pulse (!) 58   Ht 6' (1.829 m)   Wt 220 lb 3.2 oz (99.9 kg)   SpO2 94%   BMI 29.86 kg/m    Wt Readings from Last 3 Encounters:  04/25/23 220 lb 3.2 oz (99.9 kg)  01/29/23 225 lb (102.1 kg)  01/15/23 225 lb (102.1 kg)    GEN: Well nourished, well developed in no acute distress; borderline obese. NECK: No JVD; No carotid bruits CARDIAC: RRR with occasional ectopy, distant S1, S2; no murmurs, rubs, gallops RESPIRATORY:  Clear to auscultation without rales, wheezing or rhonchi ; nonlabored, good air movement. ABDOMEN: Soft,  non-tender, non-distended EXTREMITIES: Trivial edema; No deformity     ASSESSMENT AND PLAN: .    Problem List Items Addressed This Visit       Cardiology Problems   CAD S/P percutaneous coronary angioplasty (Chronic)   3+ years post PCI been maintained on Plavix monotherapy.  He had existing RCA disease at the time treated medically.  No longer on aspirin, Plavix monotherapy for maintenance. => Okay to hold Plavix 5 to 7 days preop for surgeries or procedures.      Relevant Medications   isosorbide mononitrate (IMDUR) 60 MG 24 hr tablet   Coronary artery disease involving native coronary artery of native heart with angina pectoris (HCC) - Primary (Chronic)   Exertional chest discomfort and arm weakness, similar to prior symptoms before stent placement in 2021.  Symptoms have improved since  starting Isosorbide.  Stress test and echocardiogram were non-revealing.  Discussed the risks/benefits of increasing Isosorbide vs catheterization (potential kidney damage). -Increase Isosorbide from 30mg  to 60mg  daily. -Follow-up via virtual visit in 2 months to reassess symptoms,       Relevant Medications   isosorbide mononitrate (IMDUR) 60 MG 24 hr tablet   Essential hypertension (Chronic)   Elevated blood pressure noted during visit. -Continue current antihypertensive regimen (Carvedilol 6.25mg , Amlodipine 10mg , Hydralazine 100mg ). -Increase Imdur dose to 60 mg daily  - Reassess in 2 months, via virtual visit -Unfortunately, cannot titrate beta-blocker any further due to bradycardia.  Would appreciate assistance with BP management by his nephrologist.      Relevant Medications   isosorbide mononitrate (IMDUR) 60 MG 24 hr tablet   Hyperlipidemia with target LDL less than 70 (Chronic)   Excellent control as of September on current dose of rosuvastatin.   -Continue rosuvastatin 20 mg.      Relevant Medications   isosorbide mononitrate (IMDUR) 60 MG 24 hr tablet   Non-ST elevation  (NSTEMI) myocardial infarction (HCC) (Chronic)   Non-STEMI in 2021.  This was likely related to OM 3 stenosis.    No obvious infarct noted on Myoview in this distribution although there is a anterior wall defect?  He had a "non-STEMI" back in September 2024 treated medically - trivial Troponin bump given CKD.  Non-ischemic Myoview with + prior infarct -> therefore no cath.      Relevant Medications   isosorbide mononitrate (IMDUR) 60 MG 24 hr tablet   RESOLVED: NSTEMI (non-ST elevated myocardial infarction) (HCC)   Relevant Medications   isosorbide mononitrate (IMDUR) 60 MG 24 hr tablet   ST elevation myocardial infarction (STEMI) of lateral wall, subsequent episode of care [facility]) (Chronic)   Distant history of lateral STEMI in 2011 but most recent Myoview shows possible inferior infarct.      Relevant Medications   isosorbide mononitrate (IMDUR) 60 MG 24 hr tablet     Other   CKD (chronic kidney disease) stage 4, GFR 15-29 ml/min (HCC) (Chronic)   Patient reports kidneys are at 17% function. Discussed potential impact on future cardiac interventions. -Continue current management.  Very reluctant to proceed with invasive evaluation, especially given his recent normal stress test.       Follow-up plan -Check blood pressure and assess response to increased Isosorbide dose in 2 months: Return in about 2 months (around 06/23/2023) for Followup with Telemedicine. -Physician follow-up in 4-6 months or sooner if symptoms worsen.     Signed, Marykay Lex, MD, MS George Erickson, M.D., M.S. Interventional Cardiologist  [facility]  Pager # [phone] Phone # [phone] [address] [place], [place] [address]

## 2023-04-25 NOTE — Patient Instructions (Addendum)
Medication Instructions:   Increase Isosorbide  mono ( Imdur) 60 mg  daily   *If you need a refill on your cardiac medications before your next appointment, please call your pharmacy*   Lab Work: Not needed    Testing/Procedures: Not needed    Follow-Up: At [facility], you and your health needs are our priority.  As part of our continuing mission to provide you with exceptional heart care, we have created designated Provider Care Teams.  These Care Teams include your primary Cardiologist (physician) and Advanced Practice Providers (APPs -  Physician Assistants and Nurse Practitioners) who all work together to provide you with the care you need, when you need it.     Your next appointment:   2  month(s)  The format for your next appointment:   Virtual Visit   Provider: Bryan Lemma, MD will plan to see you again in  6 to 8 month(s).

## 2023-04-29 ENCOUNTER — Encounter (HOSPITAL_COMMUNITY): Payer: Self-pay

## 2023-04-29 ENCOUNTER — Encounter: Payer: Self-pay | Admitting: Cardiology

## 2023-04-29 NOTE — Assessment & Plan Note (Signed)
 Patient reports kidneys are at 17% function. Discussed potential impact on future cardiac interventions. -Continue current management.  Very reluctant to proceed with invasive evaluation, especially given his recent normal stress test.

## 2023-04-29 NOTE — Assessment & Plan Note (Addendum)
 3+ years post PCI been maintained on Plavix monotherapy.  He had existing RCA disease at the time treated medically.  No longer on aspirin, Plavix monotherapy for maintenance. => Okay to hold Plavix 5 to 7 days preop for surgeries or procedures.

## 2023-04-29 NOTE — Assessment & Plan Note (Addendum)
 Non-STEMI in 2021.  This was likely related to OM 3 stenosis.    No obvious infarct noted on Myoview in this distribution although there is a anterior wall defect?  He had a "non-STEMI" back in September 2024 treated medically - trivial Troponin bump given CKD.  Non-ischemic Myoview with + prior infarct -> therefore no cath.

## 2023-04-29 NOTE — Assessment & Plan Note (Signed)
 Distant history of lateral STEMI in 2011 but most recent Myoview shows possible inferior infarct.

## 2023-04-29 NOTE — Assessment & Plan Note (Signed)
 Elevated blood pressure noted during visit. -Continue current antihypertensive regimen (Carvedilol 6.25mg , Amlodipine 10mg , Hydralazine 100mg ). -Increase Imdur dose to 60 mg daily  - Reassess in 2 months, via virtual visit -Unfortunately, cannot titrate beta-blocker any further due to bradycardia.  Would appreciate assistance with BP management by his nephrologist.

## 2023-04-29 NOTE — Assessment & Plan Note (Signed)
 Exertional chest discomfort and arm weakness, similar to prior symptoms before stent placement in 2021.  Symptoms have improved since starting Isosorbide.  Stress test and echocardiogram were non-revealing.  Discussed the risks/benefits of increasing Isosorbide vs catheterization (potential kidney damage). -Increase Isosorbide from 30mg  to 60mg  daily. -Follow-up via virtual visit in 2 months to reassess symptoms,

## 2023-04-29 NOTE — Assessment & Plan Note (Signed)
 Excellent control as of September on current dose of rosuvastatin.   -Continue rosuvastatin 20 mg.

## 2023-05-07 ENCOUNTER — Encounter (HOSPITAL_COMMUNITY)
Admission: RE | Admit: 2023-05-07 | Discharge: 2023-05-07 | Disposition: A | Discharge status: Home / Self Care | Payer: BC Managed Care – PPO | Source: Ambulatory Visit | Attending: Nephrology | Admitting: Nephrology

## 2023-05-07 VITALS — BP 136/84 | HR 71 | Temp 97.8°F | Resp 17

## 2023-05-07 DIAGNOSIS — N184 Chronic kidney disease, stage 4 (severe): Secondary | ICD-10-CM | POA: Insufficient documentation

## 2023-05-07 LAB — POCT HEMOGLOBIN-HEMACUE: Hemoglobin: 8.6 g/dL — ABNORMAL LOW (ref 13.0–17.0)

## 2023-05-07 MED ORDER — EPOETIN ALFA-EPBX 40000 UNIT/ML IJ SOLN
25000.0000 [IU] | INTRAMUSCULAR | Status: DC
  Administered 2023-05-07: 25000 [IU] via SUBCUTANEOUS

## 2023-05-07 MED ORDER — EPOETIN ALFA-EPBX 40000 UNIT/ML IJ SOLN
INTRAMUSCULAR | Status: AC
Start: 2023-05-07 — End: 2023-05-07
  Filled 2023-05-07: qty 1

## 2023-05-21 ENCOUNTER — Encounter (HOSPITAL_COMMUNITY)
Admission: RE | Admit: 2023-05-21 | Discharge: 2023-05-21 | Disposition: A | Discharge status: Home / Self Care | Payer: BC Managed Care – PPO | Source: Ambulatory Visit | Attending: Nephrology | Admitting: Nephrology

## 2023-05-21 VITALS — BP 140/80 | HR 70 | Temp 97.4°F | Resp 16

## 2023-05-21 DIAGNOSIS — N184 Chronic kidney disease, stage 4 (severe): Secondary | ICD-10-CM | POA: Diagnosis present

## 2023-05-21 LAB — IRON AND TIBC
Iron: 111 ug/dL (ref 45–182)
Saturation Ratios: 46 % — ABNORMAL HIGH (ref 17.9–39.5)
TIBC: 241 ug/dL — ABNORMAL LOW (ref 250–450)
UIBC: 130 ug/dL

## 2023-05-21 LAB — POCT HEMOGLOBIN-HEMACUE: Hemoglobin: 8.8 g/dL — ABNORMAL LOW (ref 13.0–17.0)

## 2023-05-21 LAB — FERRITIN: Ferritin: 600 ng/mL — ABNORMAL HIGH (ref 24–336)

## 2023-05-21 MED ORDER — EPOETIN ALFA-EPBX 40000 UNIT/ML IJ SOLN
25000.0000 [IU] | INTRAMUSCULAR | Status: DC
  Administered 2023-05-21: 25000 [IU] via SUBCUTANEOUS

## 2023-05-21 MED ORDER — EPOETIN ALFA-EPBX 40000 UNIT/ML IJ SOLN
INTRAMUSCULAR | Status: AC
Start: 2023-05-21 — End: 2023-05-21
  Filled 2023-05-21: qty 1

## 2023-06-04 ENCOUNTER — Encounter (HOSPITAL_COMMUNITY)
Admission: RE | Admit: 2023-06-04 | Discharge: 2023-06-04 | Disposition: A | Discharge status: Home / Self Care | Source: Ambulatory Visit | Attending: Nephrology | Admitting: Nephrology

## 2023-06-04 VITALS — BP 155/84 | HR 66 | Temp 97.1°F | Resp 16

## 2023-06-04 DIAGNOSIS — N184 Chronic kidney disease, stage 4 (severe): Secondary | ICD-10-CM | POA: Diagnosis not present

## 2023-06-04 LAB — POCT HEMOGLOBIN-HEMACUE: Hemoglobin: 8.9 g/dL — ABNORMAL LOW (ref 13.0–17.0)

## 2023-06-04 MED ORDER — EPOETIN ALFA-EPBX 40000 UNIT/ML IJ SOLN
25000.0000 [IU] | INTRAMUSCULAR | Status: DC
  Administered 2023-06-04: 25000 [IU] via SUBCUTANEOUS

## 2023-06-04 MED ORDER — EPOETIN ALFA-EPBX 40000 UNIT/ML IJ SOLN
INTRAMUSCULAR | Status: AC
  Filled 2023-06-04: qty 1

## 2023-06-18 ENCOUNTER — Encounter (HOSPITAL_COMMUNITY)
Admission: RE | Admit: 2023-06-18 | Discharge: 2023-06-18 | Disposition: A | Discharge status: Home / Self Care | Source: Ambulatory Visit | Attending: Nephrology | Admitting: Nephrology

## 2023-06-18 VITALS — BP 131/83 | HR 71 | Temp 97.7°F | Resp 16

## 2023-06-18 DIAGNOSIS — N184 Chronic kidney disease, stage 4 (severe): Secondary | ICD-10-CM | POA: Diagnosis present

## 2023-06-18 LAB — POCT HEMOGLOBIN-HEMACUE: Hemoglobin: 9.3 g/dL — ABNORMAL LOW (ref 13.0–17.0)

## 2023-06-18 LAB — IRON AND TIBC
Iron: 119 ug/dL (ref 45–182)
Saturation Ratios: 46 % — ABNORMAL HIGH (ref 17.9–39.5)
TIBC: 258 ug/dL (ref 250–450)
UIBC: 139 ug/dL

## 2023-06-18 LAB — FERRITIN: Ferritin: 515 ng/mL — ABNORMAL HIGH (ref 24–336)

## 2023-06-18 MED ORDER — EPOETIN ALFA-EPBX 40000 UNIT/ML IJ SOLN
INTRAMUSCULAR | Status: AC
Start: 2023-06-18 — End: 2023-06-18
  Filled 2023-06-18: qty 1

## 2023-06-18 MED ORDER — EPOETIN ALFA-EPBX 40000 UNIT/ML IJ SOLN
30000.0000 [IU] | INTRAMUSCULAR | Status: DC
  Administered 2023-06-18: 30000 [IU] via SUBCUTANEOUS

## 2023-06-20 ENCOUNTER — Telehealth: Payer: Self-pay

## 2023-06-20 ENCOUNTER — Ambulatory Visit: Payer: BC Managed Care – PPO | Attending: Cardiology | Admitting: Cardiology

## 2023-06-20 ENCOUNTER — Encounter: Payer: Self-pay | Admitting: Cardiology

## 2023-06-20 VITALS — BP 124/79 | HR 60 | Ht 72.0 in | Wt 224.4 lb

## 2023-06-20 DIAGNOSIS — R002 Palpitations: Secondary | ICD-10-CM

## 2023-06-20 DIAGNOSIS — E785 Hyperlipidemia, unspecified: Secondary | ICD-10-CM

## 2023-06-20 DIAGNOSIS — I10 Essential (primary) hypertension: Secondary | ICD-10-CM | POA: Diagnosis not present

## 2023-06-20 DIAGNOSIS — N184 Chronic kidney disease, stage 4 (severe): Secondary | ICD-10-CM | POA: Diagnosis not present

## 2023-06-20 DIAGNOSIS — I25119 Atherosclerotic heart disease of native coronary artery with unspecified angina pectoris: Secondary | ICD-10-CM

## 2023-06-20 NOTE — Assessment & Plan Note (Signed)
 Lipid panel from September showed pretty well-controlled lipids on current dose of the rosuvastatin 20 mg daily.  Continue to monitor intermittently with this lab draws.

## 2023-06-20 NOTE — Assessment & Plan Note (Signed)
 Blood pressure generally well-controlled, effective antihypertensive regimen, crucial for preventing cardiac symptom exacerbation. - Continue current antihypertensive regimen: Amlodipine 10 mg daily, hydralazine 100 mg 3 times daily, Imdur 60 Miller daily and carvedilol 6.25 mg twice daily. - Monitor blood pressure regularly.

## 2023-06-20 NOTE — Assessment & Plan Note (Signed)
 Symptoms seem to be stabilized on current dose of carvedilol.

## 2023-06-20 NOTE — Assessment & Plan Note (Addendum)
 Kidney function monitored by nephrologist, eGFR at 12. Advised to watch for swelling, avoid invasive procedures, maintain volume levels to prevent symptoms. - Continue monitoring kidney function. - Report any new symptoms such as swelling immediately to nephrologist.  George Erickson Procrit and occasional iron infusions. Well-controlled anemia crucial for managing cardiac symptoms and reducing angina likelihood. - Continue Procrit 30,000 units every two weeks. - Monitor hemoglobin and iron levels regularly. - Administer iron infusions as needed based on nephrologist's assessment.

## 2023-06-20 NOTE — Patient Instructions (Signed)
 Medication Instructions:   NO CHANGES  *If you need a refill on your cardiac medications before your next appointment, please call your pharmacy*   Lab Work:  NOT NEEDED   Testing/Procedures:  NOT NEEDED  Follow-Up: At [facility], you and your health needs are our priority.  As part of our continuing mission to provide you with exceptional heart care, we have created designated Provider Care Teams.  These Care Teams include your primary Cardiologist (physician) and Advanced Practice Providers (APPs -  Physician Assistants and Nurse Practitioners) who all work together to provide you with the care you need, when you need it.     Your next appointment:   6 month(s)  The format for your next appointment:   In Person  Provider:   Edd Fabian, FNP or Azalee Course, PA-C    Then, Bryan Lemma, MD will plan to see you again in 12 month(s).   Other Instructions

## 2023-06-20 NOTE — Telephone Encounter (Signed)
  Patient Consent for Virtual Visit        George Erickson has provided verbal consent on 06/20/2023 for a virtual visit (video or telephone).   CONSENT FOR VIRTUAL VISIT FOR:  George Erickson  By participating in this virtual visit I agree to the following:  I hereby voluntarily request, consent and authorize [facility] and its employed or contracted physicians, physician assistants, nurse practitioners or other licensed health care professionals (the Practitioner), to provide me with telemedicine health care services (the "Services") as deemed necessary by the treating Practitioner. I acknowledge and consent to receive the Services by the Practitioner via telemedicine. I understand that the telemedicine visit will involve communicating with the Practitioner through live audiovisual communication technology and the disclosure of certain medical information by electronic transmission. I acknowledge that I have been given the opportunity to request an in-person assessment or other available alternative prior to the telemedicine visit and am voluntarily participating in the telemedicine visit.  I understand that I have the right to withhold or withdraw my consent to the use of telemedicine in the course of my care at any time, without affecting my right to future care or treatment, and that the Practitioner or I may terminate the telemedicine visit at any time. I understand that I have the right to inspect all information obtained and/or recorded in the course of the telemedicine visit and may receive copies of available information for a reasonable fee.  I understand that some of the potential risks of receiving the Services via telemedicine include:  Delay or interruption in medical evaluation due to technological equipment failure or disruption; Information transmitted may not be sufficient (e.g. poor resolution of images) to allow for appropriate medical decision making by the  Practitioner; and/or  In rare instances, security protocols could fail, causing a breach of personal health information.  Furthermore, I acknowledge that it is my responsibility to provide information about my medical history, conditions and care that is complete and accurate to the best of my ability. I acknowledge that Practitioner's advice, recommendations, and/or decision may be based on factors not within their control, such as incomplete or inaccurate data provided by me or distortions of diagnostic images or specimens that may result from electronic transmissions. I understand that the practice of medicine is not an exact science and that Practitioner makes no warranties or guarantees regarding treatment outcomes. I acknowledge that a copy of this consent can be made available to me via my patient portal [facility] MyChart), or I can request a printed copy by calling the office of [facility].    I understand that my insurance will be billed for this visit.   I have read or had this consent read to me. I understand the contents of this consent, which adequately explains the benefits and risks of the Services being provided via telemedicine.  I have been provided ample opportunity to ask questions regarding this consent and the Services and have had my questions answered to my satisfaction. I give my informed consent for the services to be provided through the use of telemedicine in my medical care

## 2023-06-20 NOTE — Progress Notes (Signed)
 Virtual Visit via Video Note   Because of George Erickson co-morbid illnesses, he is at least at moderate risk for complications without adequate follow up.  This format is felt to be most appropriate for this patient at this time.  All issues noted in this document were discussed and addressed.  A limited physical exam was performed with this format.  Please refer to the patient's chart for his consent to telehealth for [facility]      Patient has given verbal permission to conduct this visit via virtual appointment and to bill insurance 06/20/2023 2:15 PM     Evaluation Performed:  Follow-up visit  Date:  06/20/2023   George Erickson, DOB [birth year], MRN [id number]  Patient Location: Other:  work Provider Location: Office/Clinic  PCP:  Shon Hale, MD  Cardiologist:  Bryan Lemma, MD  Electrophysiologist:  None   Chief Complaint:   Chief Complaint  Patient presents with   Follow-up   Coronary Artery Disease    Much better control chest pain with increased dose of Imdur.  No longer using nitro    Patient Profile: .     George Erickson is an obese 53 y.o. male  with a PMH notable for CAD-PCI (for ACS), HTN, HLD, CKD IV noted below who presents here for 70-month telehealth follow-up evaluation of chest pain.   He is being seen at the request of Shon Hale, *.  11/2009: ?STEMI - DES PCI D1.  [facility]) Patent stent on relook cath 11/2010 02/06/2020 - NSTEMI: CARDIAC CATH -PCI [facility]): Severe stenosis of LCx OM 3-DES PCI (discharged on aspirin Brilinta) D1-mid in-stent 70% restenosis; LCx-OM 3 proximal 80%, RCA mid 60 -65% => PCI OM 3-Synergy DES 2.5 mm x 20 mm (2.6 mm) --> Other notable conditions: OSA, CKD-3b/IV; HTN/HLD -> Chronic anemia requiring iron infusions along with occasional EPO.    [name] was last seen on April 25, 2023 for 7-month follow-up and he was still noticing some exertional chest  discomfort and fatigue.  Not to the same extent that he had been having back in October following his cardiac catheterization.  We discussed the reassuring echocardiogram and stress test results, and consider the possibility of microvascular disease or simply small caliber vessel disease.  Discussed the concerns for progression of renal failure were read to proceed with cardiac catheterization. Increased Imdur dose to 60 mg daily plan for couple month follow-up.  Subjective  Discussed the use of AI scribe software for clinical note transcription with the patient, who gave verbal consent to proceed.  History of Present Illness History of Present Illness George Erickson is a 53 year old male with coronary artery disease-PCI who presents for follow-up of chest pain and hypertension management.  He has experienced an improvement in chest pain, which now occurs only with significant exertion, such as carrying heavy objects. The pain is less severe than before, and he can walk about half a mile to work without experiencing chest pain.  He has not had to use nitroglycerin at all since increasing the Imdur dose.  No shortness of breath when lying flat or waking up short of breath, and no leg swelling.  He denies any headaches or blurred vision.  No rapid irregular heartbeats or palpitations.  No syncope or near syncope.  No TIA or emesis fugax.  His blood pressure readings have been variable, typically around 130/80 mmHg after taking his medications. He  takes his blood pressure medications at 6 AM. Before taking his medications, his blood pressure readings ranged from 137/83 to 145/90 mmHg, and after medication, they were between 124/79 and 131/81 mmHg.    His kidney function was last checked on March 24, showing an eGFR of 12. He reports no swelling or other symptoms and is scheduled for a follow-up in June. He receives Procrit injections every two weeks and had two iron infusions last year. He does not take  oral iron supplements.  His current medications include Imdur 60 mg, carvedilol 3.25 mg twice a day, amlodipine 10 mg daily, hydralazine 100 mg three times a day, Plavix 75 mg daily, and Uloric 40 mg daily. He uses Senokot as needed and has nitroglycerin available but has not needed to use it recently. He also has gabapentin available but has not used it in a while.  He had a stress test and echocardiogram during his last hospital visit, which were reassuring. He has not had any further studies or blood work since his last visit.    Objective   Current Meds  Medication Sig   amLODipine (NORVASC) 10 MG tablet Take 1 tablet by mouth daily.   calcitRIOL (ROCALTROL) 0.25 MCG capsule Take 0.25 mcg by mouth every evening.   carvedilol (COREG) 6.25 MG tablet Take 1 tablet by mouth twice daily   clopidogrel (PLAVIX) 75 MG tablet TAKE 1 TABLET BY MOUTH ONCE DAILY    epoetin alfa (PROCRIT) 27253 UNIT/ML injection Total of 30,000 q 14 d   epoetin alfa (PROCRIT) 66440 UNIT/ML injection Inject 20,000 Units into the skin every 14 (fourteen) days.   febuxostat (ULORIC) 40 MG tablet Take 1/2 TAB OF 40 mg by mouth daily. Pt takes half a tablet   gabapentin (NEURONTIN) 300 MG capsule Take 300 mg by mouth at bedtime. = prn   hydrALAZINE (APRESOLINE) 100 MG tablet Take 1 tablet (100 mg total) by mouth every 8 (eight) hours.   isosorbide mononitrate (IMDUR) 60 MG 24 hr tablet Take 1 tablet (60 mg total) by mouth daily.   nitroGLYCERIN (NITROSTAT) 0.4 MG SL tablet DISSOLVE ONE TABLET UNDER THE TONGUE EVERY 5 MIN PRN .   PROAIR RESPICLICK 108 (90 Base) MCG/ACT AEPB Inhale 2 puffs into the lungs every 4 (four) hours as needed (SOB/Wheezing).   rosuvastatin (CRESTOR) 20 MG tablet Take 1 tablet (20 mg total) by mouth daily.   sennosides-docusate sodium (SENOKOT-S) 8.6-50 MG tablet Take 1 tablet by mouth daily as needed for constipation.   sodium bicarbonate 650 MG tablet Take 1,300 mg by mouth 2 (two) times daily.    vitamin C (ASCORBIC ACID) 500 MG tablet Take 500 mg by mouth daily.    Studies Reviewed: Marland Kitchen       CATH (02/06/2020):D1 ~ 70&, 80 % LCx-OM3 => DES PCI OM3 (Synergy XD 2.5 x 20 mm)   Myoview 12/04/22:   Findings are consistent with no ischemia. The study is low risk.   No significant ST deviation was noted.   LV perfusion is abnormal. Basal inferior decreased uptake at both rest and stress. Mild decreased uptake anterior wall at both rest and stress (? body habitus)   Left ventricular function is normal. The left ventricular ejection fraction is normal (55-65%). End diastolic cavity size is normal.   ECHO 12/02/2022: EF 70 to 75%.  Hyperdynamic with no RWMA.  Indeterminate diastolic function-mild LA dilation.  Normal RV function but unable to assess PAP.  Lab Results  Component Value Date  CHOL 109 12/02/2022   HDL 43 12/02/2022   LDLCALC 58 12/02/2022   TRIG 41 12/02/2022   CHOLHDL 2.5 12/02/2022   Risk Assessment/Calculations:             Physical Exam:   VS:  BP 124/79   Pulse 60   Ht 6' (1.829 m)   Wt 224 lb 6.4 oz (101.8 kg)   BMI 30.43 kg/m    Wt Readings from Last 3 Encounters:  06/20/23 224 lb 6.4 oz (101.8 kg)  04/25/23 220 lb 3.2 oz (99.9 kg)  01/29/23 225 lb (102.1 kg)    GEN: Well nourished, well developed in no acute distress; healthy appearing NECK: No JVD;  RESPIRATORY: nonlabored, ABDOMEN: Soft, non-tender, non-distended EXTREMITIES:  No edema; No deformity     ASSESSMENT AND PLAN: .    Problem List Items Addressed This Visit       Cardiology Problems   Coronary artery disease involving native coronary artery of native heart with angina pectoris (HCC) - Primary (Chronic)   Intermittent mild chest pain during heavy lifting, improved symptoms, no nitroglycerin needed, effective management with Imdur.  No longer noticing symptoms at this time. Emphasized anemia control to prevent angina exacerbation. Avoid invasive procedures due to CKD. - Continue  isosorbide mononitrate (Imdur) 60 mg daily. - Continue carvedilol 3.25 mg twice daily. - Continue amlodipine 10 mg daily. - Continue hydralazine 100 mg three times daily. - Continue clopidogrel (Plavix) 75 mg daily. - Monitor for symptoms of anemia and adjust treatment as necessary.      Essential hypertension (Chronic)   Blood pressure generally well-controlled, effective antihypertensive regimen, crucial for preventing cardiac symptom exacerbation. - Continue current antihypertensive regimen: Amlodipine 10 mg daily, hydralazine 100 mg 3 times daily, Imdur 60 Miller daily and carvedilol 6.25 mg twice daily. - Monitor blood pressure regularly.      Hyperlipidemia with target low density lipoprotein (LDL) cholesterol less than 55 mg/dL (Chronic)   Lipid panel from September showed pretty well-controlled lipids on current dose of the rosuvastatin 20 mg daily.  Continue to monitor intermittently with this lab draws.        Other   CKD (chronic kidney disease) stage 4, GFR 15-29 ml/min (HCC) (Chronic)   Kidney function monitored by nephrologist, eGFR at 12. Advised to watch for swelling, avoid invasive procedures, maintain volume levels to prevent symptoms. - Continue monitoring kidney function. - Report any new symptoms such as swelling immediately to nephrologist.  Dolores Lory Procrit and occasional iron infusions. Well-controlled anemia crucial for managing cardiac symptoms and reducing angina likelihood. - Continue Procrit 30,000 units every two weeks. - Monitor hemoglobin and iron levels regularly. - Administer iron infusions as needed based on nephrologist's assessment.      Palpitations - with exertion (Chronic)   Symptoms seem to be stabilized on current dose of carvedilol.              Follow-Up: Return in about 6 months (around 12/20/2023) for Alternate 6 month follow-up with APP & MD, [address].  Total time spent: 20 min spent with patient + 7 min  spent charting (reviewing prior studies and notes as well as dictating) = 27 min    Signed, Marykay Lex, MD, MS Bryan Lemma, M.D., M.S. Interventional Cardiologist  [facility]  Pager # [phone] Phone # [phone] [address] [place], [place] [address]

## 2023-06-20 NOTE — Assessment & Plan Note (Signed)
 Intermittent mild chest pain during heavy lifting, improved symptoms, no nitroglycerin needed, effective management with Imdur.  No longer noticing symptoms at this time. Emphasized anemia control to prevent angina exacerbation. Avoid invasive procedures due to CKD. - Continue isosorbide mononitrate (Imdur) 60 mg daily. - Continue carvedilol 3.25 mg twice daily. - Continue amlodipine 10 mg daily. - Continue hydralazine 100 mg three times daily. - Continue clopidogrel (Plavix) 75 mg daily. - Monitor for symptoms of anemia and adjust treatment as necessary.

## 2023-06-22 ENCOUNTER — Ambulatory Visit: Payer: BC Managed Care – PPO | Admitting: Nurse Practitioner

## 2023-07-02 ENCOUNTER — Encounter (HOSPITAL_COMMUNITY)
Admission: RE | Admit: 2023-07-02 | Discharge: 2023-07-02 | Disposition: A | Discharge status: Home / Self Care | Source: Ambulatory Visit | Attending: Nephrology | Admitting: Nephrology

## 2023-07-02 DIAGNOSIS — N184 Chronic kidney disease, stage 4 (severe): Secondary | ICD-10-CM

## 2023-07-02 LAB — POCT HEMOGLOBIN-HEMACUE: Hemoglobin: 9.7 g/dL — ABNORMAL LOW (ref 13.0–17.0)

## 2023-07-02 MED ORDER — EPOETIN ALFA 10000 UNIT/ML IJ SOLN: 30000.0000 [IU] | Freq: Once | INTRAMUSCULAR | Status: DC

## 2023-07-02 MED ORDER — EPOETIN ALFA-EPBX 40000 UNIT/ML IJ SOLN
30000.0000 [IU] | Freq: Once | INTRAMUSCULAR | Status: AC
  Administered 2023-07-02: 30000 [IU] via SUBCUTANEOUS

## 2023-07-02 MED ORDER — EPOETIN ALFA-EPBX 40000 UNIT/ML IJ SOLN
INTRAMUSCULAR | Status: AC
  Filled 2023-07-02: qty 1

## 2023-07-16 ENCOUNTER — Encounter (HOSPITAL_COMMUNITY)
Admission: RE | Admit: 2023-07-16 | Discharge: 2023-07-16 | Disposition: A | Discharge status: Home / Self Care | Source: Ambulatory Visit | Attending: Nephrology | Admitting: Nephrology

## 2023-07-16 VITALS — BP 129/83 | HR 60 | Temp 97.9°F | Resp 16

## 2023-07-16 DIAGNOSIS — N184 Chronic kidney disease, stage 4 (severe): Secondary | ICD-10-CM | POA: Diagnosis present

## 2023-07-16 LAB — IRON AND TIBC
Iron: 104 ug/dL (ref 45–182)
Saturation Ratios: 44 % — ABNORMAL HIGH (ref 17.9–39.5)
TIBC: 237 ug/dL — ABNORMAL LOW (ref 250–450)
UIBC: 133 ug/dL

## 2023-07-16 LAB — FERRITIN: Ferritin: 413 ng/mL — ABNORMAL HIGH (ref 24–336)

## 2023-07-16 MED ORDER — EPOETIN ALFA-EPBX 40000 UNIT/ML IJ SOLN
INTRAMUSCULAR | Status: AC
  Administered 2023-07-16: 30000 [IU] via SUBCUTANEOUS
  Filled 2023-07-16: qty 1

## 2023-07-16 MED ORDER — EPOETIN ALFA-EPBX 40000 UNIT/ML IJ SOLN: 30000.0000 [IU] | Freq: Once | INTRAMUSCULAR | Status: AC

## 2023-07-16 MED ORDER — EPOETIN ALFA 10000 UNIT/ML IJ SOLN
30000.0000 [IU] | Freq: Once | INTRAMUSCULAR | Status: DC
Start: 2023-07-16 — End: 2023-07-16

## 2023-07-17 LAB — POCT HEMOGLOBIN-HEMACUE: Hemoglobin: 9.7 g/dL — ABNORMAL LOW (ref 13.0–17.0)

## 2023-07-30 ENCOUNTER — Encounter (HOSPITAL_COMMUNITY)
Admission: RE | Admit: 2023-07-30 | Discharge: 2023-07-30 | Disposition: A | Discharge status: Home / Self Care | Source: Ambulatory Visit | Attending: Nephrology | Admitting: Nephrology

## 2023-07-30 VITALS — BP 126/75 | HR 73 | Temp 97.5°F | Resp 16

## 2023-07-30 DIAGNOSIS — N184 Chronic kidney disease, stage 4 (severe): Secondary | ICD-10-CM | POA: Diagnosis not present

## 2023-07-30 LAB — POCT HEMOGLOBIN-HEMACUE: Hemoglobin: 9.9 g/dL — ABNORMAL LOW (ref 13.0–17.0)

## 2023-07-30 MED ORDER — EPOETIN ALFA-EPBX 10000 UNIT/ML IJ SOLN
10000.0000 [IU] | Freq: Once | INTRAMUSCULAR | Status: AC
  Administered 2023-07-30: 10000 [IU] via SUBCUTANEOUS
  Filled 2023-07-30: qty 1

## 2023-07-30 MED ORDER — EPOETIN ALFA-EPBX 40000 UNIT/ML IJ SOLN
INTRAMUSCULAR | Status: AC
Start: 2023-07-30 — End: ?
  Filled 2023-07-30: qty 1

## 2023-07-30 MED ORDER — EPOETIN ALFA-EPBX 20000 UNIT/ML IJ SOLN
20000.0000 [IU] | Freq: Once | INTRAMUSCULAR | Status: AC
  Administered 2023-07-30: 20000 [IU] via SUBCUTANEOUS
  Filled 2023-07-30: qty 1

## 2023-08-08 ENCOUNTER — Telehealth: Payer: Self-pay

## 2023-08-08 NOTE — Telephone Encounter (Signed)
 Auth Submission: APPROVED Site of care: Site of care: [facility] Payer: [employer] [place]  commercial Medication & CPT/J Code(s) submitted: Retacrit  Route of submission (phone, fax, portal): phone Phone # [phone] Fax # Auth type: Buy/Bill PB Units/visits requested: 30,000 units  Reference number: [id number] Approval from: 08/08/23 to 09/18/23  Approval letter in media tab

## 2023-08-13 ENCOUNTER — Encounter (HOSPITAL_COMMUNITY)
Admission: RE | Admit: 2023-08-13 | Discharge: 2023-08-13 | Disposition: A | Discharge status: Home / Self Care | Source: Ambulatory Visit | Attending: Nephrology | Admitting: Nephrology

## 2023-08-13 VITALS — BP 153/87 | HR 59 | Resp 16

## 2023-08-13 DIAGNOSIS — N184 Chronic kidney disease, stage 4 (severe): Secondary | ICD-10-CM | POA: Insufficient documentation

## 2023-08-13 LAB — IRON AND TIBC
Iron: 92 ug/dL (ref 45–182)
Saturation Ratios: 36 % (ref 17.9–39.5)
TIBC: 255 ug/dL (ref 250–450)
UIBC: 163 ug/dL

## 2023-08-13 LAB — POCT HEMOGLOBIN-HEMACUE: Hemoglobin: 9.7 g/dL — ABNORMAL LOW (ref 13.0–17.0)

## 2023-08-13 LAB — FERRITIN: Ferritin: 478 ng/mL — ABNORMAL HIGH (ref 24–336)

## 2023-08-13 MED ORDER — EPOETIN ALFA-EPBX 40000 UNIT/ML IJ SOLN
INTRAMUSCULAR | Status: AC
  Filled 2023-08-13: qty 1

## 2023-08-13 MED ORDER — EPOETIN ALFA-EPBX 40000 UNIT/ML IJ SOLN
30000.0000 [IU] | INTRAMUSCULAR | Status: DC
  Administered 2023-08-13: 30000 [IU] via SUBCUTANEOUS
  Filled 2023-08-13: qty 1

## 2023-08-27 ENCOUNTER — Encounter (HOSPITAL_COMMUNITY)
Admission: RE | Admit: 2023-08-27 | Discharge: 2023-08-27 | Disposition: A | Discharge status: Home / Self Care | Source: Ambulatory Visit | Attending: Nephrology | Admitting: Nephrology

## 2023-08-27 VITALS — BP 115/86 | HR 62 | Temp 97.5°F | Resp 16

## 2023-08-27 DIAGNOSIS — N184 Chronic kidney disease, stage 4 (severe): Secondary | ICD-10-CM

## 2023-08-27 LAB — POCT HEMOGLOBIN-HEMACUE: Hemoglobin: 9.6 g/dL — ABNORMAL LOW (ref 13.0–17.0)

## 2023-08-27 MED ORDER — EPOETIN ALFA-EPBX 40000 UNIT/ML IJ SOLN
30000.0000 [IU] | INTRAMUSCULAR | Status: DC
  Administered 2023-08-27: 30000 [IU] via SUBCUTANEOUS
  Filled 2023-08-27: qty 1

## 2023-08-27 MED ORDER — EPOETIN ALFA-EPBX 40000 UNIT/ML IJ SOLN
INTRAMUSCULAR | Status: AC
Start: 2023-08-27 — End: 2023-08-27
  Filled 2023-08-27: qty 1

## 2023-08-31 ENCOUNTER — Telehealth: Payer: Self-pay

## 2023-08-31 NOTE — Telephone Encounter (Signed)
   Pre-operative Risk Assessment    Patient Name: George Erickson  DOB: 1970-06-21 MRN: [id number]   Date of last office visit: 04/25/23 Randene Bustard, MD Date of next office visit: NONE   Request for Surgical Clearance    Procedure:  VITRECTOMY SURGERY  Date of Surgery:  Clearance 09/10/23   (PER REQUEST **URGENT**URGENT**URGENT)                            Surgeon:  DR Lerry Ransom Surgeon's Group or Practice Name:  [facility] Phone number:  [phone] Fax number:  [phone]   Type of Clearance Requested:   - Medical  - Pharmacy:  Hold Clopidogrel  (Plavix ) 5 DAYS PRIOR   Type of Anesthesia:  Not Indicated   Additional requests/questions:    Signed, Collin Deal   08/31/2023, 3:23 PM \

## 2023-08-31 NOTE — Telephone Encounter (Signed)
   Name: George Erickson  DOB: 27-Apr-1970  MRN: [id number]   Primary Cardiologist: Randene Bustard, MD  Chart reviewed as part of pre-operative protocol coverage. Patient was contacted 08/31/2023 in reference to pre-operative risk assessment for pending surgery as outlined below.  George Erickson was last seen on 06/20/2023 by Dr. Addie Holstein.  Since that day, George Erickson has done well. No angina symptoms. Palpitations, syncope, or shortness of breath. .  Therefore, based on ACC/AHA guidelines, the patient would be at acceptable risk for the planned procedure without further cardiovascular testing.   Per office protocol, if patient is without any new symptoms or concerns at the time of their virtual visit, he/she may hold Plavix  for 5 days prior to procedure. Please resume Plavix  as soon as possible postprocedure, at the discretion of the surgeon.    The patient was advised that if he develops new symptoms prior to surgery to contact our office to arrange for a follow-up visit, and he verbalized understanding.  I will route this recommendation to the requesting party via Epic fax function and remove from pre-op pool. Please call with questions.  Friddie Jetty, NP 08/31/2023, 3:46 PM

## 2023-09-10 ENCOUNTER — Encounter (HOSPITAL_COMMUNITY)

## 2023-09-11 ENCOUNTER — Encounter (HOSPITAL_COMMUNITY)
Admission: RE | Admit: 2023-09-11 | Discharge: 2023-09-11 | Disposition: A | Discharge status: Home / Self Care | Source: Ambulatory Visit | Attending: Nephrology | Admitting: Nephrology

## 2023-09-11 VITALS — BP 132/83 | HR 61 | Temp 97.9°F | Resp 16

## 2023-09-11 DIAGNOSIS — N184 Chronic kidney disease, stage 4 (severe): Secondary | ICD-10-CM | POA: Diagnosis present

## 2023-09-11 LAB — IRON AND TIBC
Iron: 129 ug/dL (ref 45–182)
Saturation Ratios: 49 % — ABNORMAL HIGH (ref 17.9–39.5)
TIBC: 265 ug/dL (ref 250–450)
UIBC: 136 ug/dL

## 2023-09-11 LAB — FERRITIN: Ferritin: 386 ng/mL — ABNORMAL HIGH (ref 24–336)

## 2023-09-11 LAB — POCT HEMOGLOBIN-HEMACUE: Hemoglobin: 9.2 g/dL — ABNORMAL LOW (ref 13.0–17.0)

## 2023-09-11 MED ORDER — EPOETIN ALFA-EPBX 40000 UNIT/ML IJ SOLN
INTRAMUSCULAR | Status: AC
  Filled 2023-09-11: qty 1

## 2023-09-11 MED ORDER — EPOETIN ALFA-EPBX 40000 UNIT/ML IJ SOLN
30000.0000 [IU] | INTRAMUSCULAR | Status: DC
  Administered 2023-09-11: 30000 [IU] via SUBCUTANEOUS

## 2023-09-19 ENCOUNTER — Other Ambulatory Visit: Payer: Self-pay | Admitting: Cardiology

## 2023-09-21 NOTE — Telephone Encounter (Signed)
 For review-Dr Anner ok w/ filling?

## 2023-09-21 NOTE — Telephone Encounter (Signed)
 Medication refilled

## 2023-09-24 ENCOUNTER — Inpatient Hospital Stay (HOSPITAL_COMMUNITY)
Admission: RE | Admit: 2023-09-24 | Discharge: 2023-09-24 | Disposition: A | Discharge status: Home / Self Care | Source: Ambulatory Visit | Attending: Nephrology

## 2023-09-24 VITALS — BP 140/85 | HR 65 | Temp 97.7°F | Resp 16

## 2023-09-24 DIAGNOSIS — N184 Chronic kidney disease, stage 4 (severe): Secondary | ICD-10-CM | POA: Diagnosis not present

## 2023-09-24 LAB — POCT HEMOGLOBIN-HEMACUE: Hemoglobin: 9.8 g/dL — ABNORMAL LOW (ref 13.0–17.0)

## 2023-09-24 MED ORDER — EPOETIN ALFA-EPBX 40000 UNIT/ML IJ SOLN
INTRAMUSCULAR | Status: AC
  Filled 2023-09-24: qty 1

## 2023-09-24 MED ORDER — EPOETIN ALFA-EPBX 40000 UNIT/ML IJ SOLN
35000.0000 [IU] | INTRAMUSCULAR | Status: DC
  Administered 2023-09-24: 35000 [IU] via SUBCUTANEOUS

## 2023-09-25 ENCOUNTER — Encounter (HOSPITAL_COMMUNITY)

## 2023-10-08 ENCOUNTER — Encounter (HOSPITAL_COMMUNITY)

## 2023-10-09 ENCOUNTER — Encounter (HOSPITAL_COMMUNITY)
Admission: RE | Admit: 2023-10-09 | Discharge: 2023-10-09 | Disposition: A | Discharge status: Home / Self Care | Source: Ambulatory Visit | Attending: Nephrology

## 2023-10-09 VITALS — BP 140/82 | HR 58 | Temp 97.4°F | Resp 16

## 2023-10-09 DIAGNOSIS — N184 Chronic kidney disease, stage 4 (severe): Secondary | ICD-10-CM | POA: Diagnosis not present

## 2023-10-09 LAB — POCT HEMOGLOBIN-HEMACUE: Hemoglobin: 9.6 g/dL — ABNORMAL LOW (ref 13.0–17.0)

## 2023-10-09 MED ORDER — EPOETIN ALFA-EPBX 40000 UNIT/ML IJ SOLN
INTRAMUSCULAR | Status: AC
  Filled 2023-10-09: qty 1

## 2023-10-09 MED ORDER — EPOETIN ALFA-EPBX 40000 UNIT/ML IJ SOLN
35000.0000 [IU] | INTRAMUSCULAR | Status: DC
  Administered 2023-10-09: 35000 [IU] via SUBCUTANEOUS
  Filled 2023-10-09: qty 1

## 2023-10-22 ENCOUNTER — Encounter (HOSPITAL_COMMUNITY)
Admission: RE | Admit: 2023-10-22 | Discharge: 2023-10-22 | Disposition: A | Discharge status: Home / Self Care | Source: Ambulatory Visit | Attending: Nephrology | Admitting: Nephrology

## 2023-10-22 ENCOUNTER — Encounter (HOSPITAL_COMMUNITY)

## 2023-10-22 VITALS — BP 165/93 | HR 62 | Temp 97.7°F | Resp 16

## 2023-10-22 DIAGNOSIS — N184 Chronic kidney disease, stage 4 (severe): Secondary | ICD-10-CM | POA: Insufficient documentation

## 2023-10-22 LAB — POCT HEMOGLOBIN-HEMACUE: Hemoglobin: 9.9 g/dL — ABNORMAL LOW (ref 13.0–17.0)

## 2023-10-22 LAB — IRON AND TIBC
Iron: 111 ug/dL (ref 45–182)
Saturation Ratios: 44 % — ABNORMAL HIGH (ref 17.9–39.5)
TIBC: 253 ug/dL (ref 250–450)
UIBC: 142 ug/dL

## 2023-10-22 LAB — FERRITIN: Ferritin: 412 ng/mL — ABNORMAL HIGH (ref 24–336)

## 2023-10-22 MED ORDER — EPOETIN ALFA-EPBX 40000 UNIT/ML IJ SOLN
INTRAMUSCULAR | Status: AC
  Filled 2023-10-22: qty 1

## 2023-10-22 MED ORDER — EPOETIN ALFA-EPBX 40000 UNIT/ML IJ SOLN
35000.0000 [IU] | INTRAMUSCULAR | Status: DC
  Administered 2023-10-22 (×2): 35000 [IU] via SUBCUTANEOUS
  Filled 2023-10-22: qty 1

## 2023-10-23 ENCOUNTER — Encounter (HOSPITAL_COMMUNITY)

## 2023-11-02 ENCOUNTER — Other Ambulatory Visit: Payer: Self-pay | Admitting: Cardiology

## 2023-11-05 ENCOUNTER — Encounter (HOSPITAL_COMMUNITY)
Admission: RE | Admit: 2023-11-05 | Discharge: 2023-11-05 | Disposition: A | Discharge status: Home / Self Care | Source: Ambulatory Visit | Attending: Nephrology

## 2023-11-05 VITALS — BP 138/85 | HR 63 | Temp 97.3°F | Resp 16

## 2023-11-05 DIAGNOSIS — N184 Chronic kidney disease, stage 4 (severe): Secondary | ICD-10-CM | POA: Diagnosis not present

## 2023-11-05 LAB — POCT HEMOGLOBIN-HEMACUE: Hemoglobin: 10.2 g/dL — ABNORMAL LOW (ref 13.0–17.0)

## 2023-11-05 MED ORDER — EPOETIN ALFA-EPBX 40000 UNIT/ML IJ SOLN
INTRAMUSCULAR | Status: AC
  Filled 2023-11-05: qty 1

## 2023-11-05 MED ORDER — EPOETIN ALFA-EPBX 40000 UNIT/ML IJ SOLN
35000.0000 [IU] | INTRAMUSCULAR | Status: DC
  Administered 2023-11-05: 35000 [IU] via SUBCUTANEOUS

## 2023-11-19 ENCOUNTER — Encounter (HOSPITAL_COMMUNITY)
Admission: RE | Admit: 2023-11-19 | Discharge: 2023-11-19 | Disposition: A | Discharge status: Home / Self Care | Source: Ambulatory Visit | Attending: Nephrology | Admitting: Nephrology

## 2023-11-19 VITALS — BP 125/83 | HR 63 | Temp 97.6°F | Resp 16

## 2023-11-19 DIAGNOSIS — N184 Chronic kidney disease, stage 4 (severe): Secondary | ICD-10-CM | POA: Insufficient documentation

## 2023-11-19 LAB — IRON AND TIBC
Iron: 108 ug/dL (ref 45–182)
Saturation Ratios: 47 % — ABNORMAL HIGH (ref 17.9–39.5)
TIBC: 228 ug/dL — ABNORMAL LOW (ref 250–450)
UIBC: 120 ug/dL

## 2023-11-19 LAB — FERRITIN: Ferritin: 495 ng/mL — ABNORMAL HIGH (ref 24–336)

## 2023-11-19 LAB — POCT HEMOGLOBIN-HEMACUE: Hemoglobin: 9.8 g/dL — ABNORMAL LOW (ref 13.0–17.0)

## 2023-11-19 MED ORDER — EPOETIN ALFA-EPBX 40000 UNIT/ML IJ SOLN
35000.0000 [IU] | INTRAMUSCULAR | Status: DC
  Administered 2023-11-19: 35000 [IU] via SUBCUTANEOUS

## 2023-11-19 MED ORDER — EPOETIN ALFA-EPBX 40000 UNIT/ML IJ SOLN
INTRAMUSCULAR | Status: AC
  Filled 2023-11-19: qty 1

## 2023-11-22 ENCOUNTER — Other Ambulatory Visit (HOSPITAL_COMMUNITY): Payer: Self-pay | Admitting: Nephrology

## 2023-11-22 ENCOUNTER — Telehealth (HOSPITAL_COMMUNITY): Payer: Self-pay

## 2023-11-22 DIAGNOSIS — N185 Chronic kidney disease, stage 5: Secondary | ICD-10-CM | POA: Insufficient documentation

## 2023-11-22 NOTE — Telephone Encounter (Addendum)
 Auth Submission: APPROVED Site of care: Site of care: [facility] Payer: [employer] [place] Medication & CPT/J Code(s) submitted: Retacrit  ([id number]) Diagnosis Code: N18.5, D63.1 Route of submission (phone, fax, portal): phone Phone #[phone] Fax #[phone] (fax for clinicals) Auth type: Buy/Bill HB Units/visits requested: 35000 units q2 weeks Reference number: [id number] Approval from: 11/22/23 to 01/02/24

## 2023-12-03 ENCOUNTER — Inpatient Hospital Stay (HOSPITAL_COMMUNITY): Admission: RE | Admit: 2023-12-03 | Source: Ambulatory Visit

## 2023-12-03 ENCOUNTER — Ambulatory Visit (HOSPITAL_COMMUNITY)
Admission: RE | Admit: 2023-12-03 | Discharge: 2023-12-03 | Disposition: A | Discharge status: Home / Self Care | Source: Ambulatory Visit | Attending: Nephrology | Admitting: Nephrology

## 2023-12-03 VITALS — BP 151/92 | HR 70 | Temp 97.0°F | Resp 16

## 2023-12-03 DIAGNOSIS — D631 Anemia in chronic kidney disease: Secondary | ICD-10-CM | POA: Insufficient documentation

## 2023-12-03 DIAGNOSIS — N185 Chronic kidney disease, stage 5: Secondary | ICD-10-CM | POA: Insufficient documentation

## 2023-12-03 LAB — POCT HEMOGLOBIN-HEMACUE: Hemoglobin: 9.1 g/dL — ABNORMAL LOW (ref 13.0–17.0)

## 2023-12-03 MED ORDER — EPOETIN ALFA-EPBX 40000 UNIT/ML IJ SOLN
INTRAMUSCULAR | Status: AC
  Filled 2023-12-03: qty 1

## 2023-12-03 MED ORDER — EPOETIN ALFA-EPBX 40000 UNIT/ML IJ SOLN
35000.0000 [IU] | Freq: Once | INTRAMUSCULAR | Status: AC
  Administered 2023-12-03: 35000 [IU] via SUBCUTANEOUS

## 2023-12-05 ENCOUNTER — Other Ambulatory Visit: Payer: Self-pay | Admitting: Adult Health

## 2023-12-05 ENCOUNTER — Other Ambulatory Visit: Payer: Self-pay | Admitting: Cardiology

## 2023-12-17 ENCOUNTER — Ambulatory Visit (HOSPITAL_COMMUNITY)
Admission: RE | Admit: 2023-12-17 | Discharge: 2023-12-17 | Disposition: A | Discharge status: Home / Self Care | Source: Ambulatory Visit | Attending: Nephrology | Admitting: Nephrology

## 2023-12-17 VITALS — BP 138/81 | HR 69 | Temp 97.0°F

## 2023-12-17 DIAGNOSIS — D631 Anemia in chronic kidney disease: Secondary | ICD-10-CM | POA: Insufficient documentation

## 2023-12-17 DIAGNOSIS — N185 Chronic kidney disease, stage 5: Secondary | ICD-10-CM | POA: Diagnosis present

## 2023-12-17 LAB — IRON AND TIBC
Iron: 133 ug/dL (ref 45–182)
Saturation Ratios: 57 % — ABNORMAL HIGH (ref 17.9–39.5)
TIBC: 235 ug/dL — ABNORMAL LOW (ref 250–450)
UIBC: 102 ug/dL

## 2023-12-17 LAB — POCT HEMOGLOBIN-HEMACUE: Hemoglobin: 9.5 g/dL — ABNORMAL LOW (ref 13.0–17.0)

## 2023-12-17 LAB — FERRITIN: Ferritin: 396 ng/mL — ABNORMAL HIGH (ref 24–336)

## 2023-12-17 MED ORDER — EPOETIN ALFA-EPBX 40000 UNIT/ML IJ SOLN
INTRAMUSCULAR | Status: AC
  Filled 2023-12-17: qty 1

## 2023-12-17 MED ORDER — EPOETIN ALFA-EPBX 40000 UNIT/ML IJ SOLN
35000.0000 [IU] | Freq: Once | INTRAMUSCULAR | Status: AC
  Administered 2023-12-17: 35000 [IU] via SUBCUTANEOUS

## 2023-12-25 ENCOUNTER — Telehealth (HOSPITAL_COMMUNITY): Payer: Self-pay

## 2023-12-25 NOTE — Telephone Encounter (Addendum)
 Auth Submission: APPROVED Site of care: Site of care: [facility] Payer: [place] Medication & CPT/J Code(s) submitted: Retacrit  ([id number]) Diagnosis Code: D63.1, N18.5 Route of submission (phone, fax, portal): phone Phone #[phone] Fax #[phone] (fax for clinicals) Auth type: Buy/Bill HB Units/visits requested: 35000 units q2weeks Reference number: [id number] Authorization number: [id number] Approval from: 01/03/24 to 02/13/24   Submitted auth via phone with Melissa.  Approval letter scanned into media tab.

## 2023-12-31 ENCOUNTER — Encounter (HOSPITAL_COMMUNITY)
Admission: RE | Admit: 2023-12-31 | Discharge: 2023-12-31 | Disposition: A | Discharge status: Home / Self Care | Source: Ambulatory Visit | Attending: Nephrology | Admitting: Nephrology

## 2023-12-31 VITALS — BP 124/81 | HR 55 | Temp 97.4°F | Resp 16

## 2023-12-31 DIAGNOSIS — N185 Chronic kidney disease, stage 5: Secondary | ICD-10-CM | POA: Insufficient documentation

## 2023-12-31 DIAGNOSIS — D631 Anemia in chronic kidney disease: Secondary | ICD-10-CM | POA: Diagnosis present

## 2023-12-31 LAB — POCT HEMOGLOBIN-HEMACUE: Hemoglobin: 9.6 g/dL — ABNORMAL LOW (ref 13.0–17.0)

## 2023-12-31 MED ORDER — EPOETIN ALFA-EPBX 40000 UNIT/ML IJ SOLN
35000.0000 [IU] | Freq: Once | INTRAMUSCULAR | Status: AC
  Administered 2023-12-31: 35000 [IU] via SUBCUTANEOUS

## 2023-12-31 MED ORDER — EPOETIN ALFA-EPBX 40000 UNIT/ML IJ SOLN
INTRAMUSCULAR | Status: AC
  Filled 2023-12-31: qty 1

## 2024-01-03 ENCOUNTER — Other Ambulatory Visit: Payer: Self-pay | Admitting: Cardiology

## 2024-01-14 ENCOUNTER — Encounter (HOSPITAL_COMMUNITY)
Admission: RE | Admit: 2024-01-14 | Discharge: 2024-01-14 | Disposition: A | Discharge status: Home / Self Care | Source: Ambulatory Visit | Attending: Nephrology | Admitting: Nephrology

## 2024-01-14 VITALS — BP 142/80 | HR 58 | Temp 97.4°F | Resp 16

## 2024-01-14 DIAGNOSIS — N185 Chronic kidney disease, stage 5: Secondary | ICD-10-CM | POA: Diagnosis present

## 2024-01-14 DIAGNOSIS — D631 Anemia in chronic kidney disease: Secondary | ICD-10-CM | POA: Diagnosis present

## 2024-01-14 LAB — FERRITIN: Ferritin: 393 ng/mL — ABNORMAL HIGH (ref 24–336)

## 2024-01-14 LAB — IRON AND TIBC
Iron: 111 ug/dL (ref 45–182)
Saturation Ratios: 45 % — ABNORMAL HIGH (ref 17.9–39.5)
TIBC: 246 ug/dL — ABNORMAL LOW (ref 250–450)
UIBC: 135 ug/dL

## 2024-01-14 LAB — POCT HEMOGLOBIN-HEMACUE: Hemoglobin: 9.6 g/dL — ABNORMAL LOW (ref 13.0–17.0)

## 2024-01-14 MED ORDER — EPOETIN ALFA-EPBX 40000 UNIT/ML IJ SOLN
35000.0000 [IU] | Freq: Once | INTRAMUSCULAR | Status: AC
  Administered 2024-01-14: 35000 [IU] via SUBCUTANEOUS

## 2024-01-14 MED ORDER — EPOETIN ALFA-EPBX 40000 UNIT/ML IJ SOLN
INTRAMUSCULAR | Status: AC
  Filled 2024-01-14: qty 1

## 2024-01-28 ENCOUNTER — Encounter (HOSPITAL_COMMUNITY)
Admission: RE | Admit: 2024-01-28 | Discharge: 2024-01-28 | Disposition: A | Discharge status: Home / Self Care | Source: Ambulatory Visit | Attending: Nephrology | Admitting: Nephrology

## 2024-01-28 VITALS — BP 154/93 | HR 58 | Temp 97.4°F | Resp 16

## 2024-01-28 DIAGNOSIS — D631 Anemia in chronic kidney disease: Secondary | ICD-10-CM

## 2024-01-28 DIAGNOSIS — N185 Chronic kidney disease, stage 5: Secondary | ICD-10-CM | POA: Diagnosis not present

## 2024-01-28 MED ORDER — EPOETIN ALFA-EPBX 40000 UNIT/ML IJ SOLN
35000.0000 [IU] | Freq: Once | INTRAMUSCULAR | Status: AC
  Administered 2024-01-28: 35000 [IU] via SUBCUTANEOUS

## 2024-01-28 MED ORDER — EPOETIN ALFA-EPBX 40000 UNIT/ML IJ SOLN
INTRAMUSCULAR | Status: AC
  Filled 2024-01-28: qty 1

## 2024-01-30 NOTE — Progress Notes (Addendum)
 Notified George Erickson at [facility] of HGB 7.2. Patient denied any bleeding or SOB. Given order to give procrit  30000 today.    Error-   wrong patient

## 2024-02-04 ENCOUNTER — Telehealth (HOSPITAL_COMMUNITY): Payer: Self-pay | Admitting: Pharmacy Technician

## 2024-02-04 NOTE — Telephone Encounter (Signed)
 Auth Submission: APPROVED Site of care: [facility] Payer: [place] Medication & CPT/J Code(s) submitted: Q5106 - [facility] IN[facility] RETACRIT  NON-ESRD USE Diagnosis Code: N18.5, D63.1 Route of submission (phone, fax, portal): phone Phone # [phone]  Fax # [phone] (for clinicals) Auth type: Buy/Bill HB Units/visits requested: 35000 UNITS EVERY 14 DAYS Tracking # [id number] Reference number: [id number] Approval from: 02/14/24 to 03/26/24    Can submit auth for day after current PA expires. Responses from insurance have been same day.    Makita Blow, CPhT [facility] Phone: [phone] 02/04/2024

## 2024-02-11 ENCOUNTER — Encounter (HOSPITAL_COMMUNITY)
Admission: RE | Admit: 2024-02-11 | Discharge: 2024-02-11 | Disposition: A | Discharge status: Home / Self Care | Source: Ambulatory Visit | Attending: Nephrology | Admitting: Nephrology

## 2024-02-11 VITALS — BP 141/84 | HR 63 | Temp 97.5°F | Resp 16

## 2024-02-11 DIAGNOSIS — D631 Anemia in chronic kidney disease: Secondary | ICD-10-CM | POA: Insufficient documentation

## 2024-02-11 DIAGNOSIS — N185 Chronic kidney disease, stage 5: Secondary | ICD-10-CM | POA: Insufficient documentation

## 2024-02-11 LAB — IRON AND TIBC
Iron: 128 ug/dL (ref 45–182)
Saturation Ratios: 52 % — ABNORMAL HIGH (ref 17.9–39.5)
TIBC: 245 ug/dL — ABNORMAL LOW (ref 250–450)
UIBC: 117 ug/dL

## 2024-02-11 LAB — FERRITIN: Ferritin: 374 ng/mL — ABNORMAL HIGH (ref 24–336)

## 2024-02-11 LAB — POCT HEMOGLOBIN-HEMACUE: Hemoglobin: 9.3 g/dL — ABNORMAL LOW (ref 13.0–17.0)

## 2024-02-11 MED ORDER — EPOETIN ALFA-EPBX 40000 UNIT/ML IJ SOLN
35000.0000 [IU] | Freq: Once | INTRAMUSCULAR | Status: AC
  Administered 2024-02-11: 35000 [IU] via SUBCUTANEOUS

## 2024-02-11 MED ORDER — EPOETIN ALFA-EPBX 40000 UNIT/ML IJ SOLN
INTRAMUSCULAR | Status: AC
  Filled 2024-02-11: qty 1

## 2024-02-21 ENCOUNTER — Encounter: Payer: Self-pay | Admitting: Cardiology

## 2024-02-25 ENCOUNTER — Inpatient Hospital Stay (HOSPITAL_COMMUNITY)
Admission: RE | Admit: 2024-02-25 | Discharge: 2024-02-25 | Disposition: A | Source: Ambulatory Visit | Attending: Nephrology

## 2024-02-25 VITALS — BP 133/89 | HR 55 | Temp 97.4°F | Resp 16

## 2024-02-25 DIAGNOSIS — D631 Anemia in chronic kidney disease: Secondary | ICD-10-CM

## 2024-02-25 DIAGNOSIS — N185 Chronic kidney disease, stage 5: Secondary | ICD-10-CM | POA: Diagnosis not present

## 2024-02-25 LAB — POCT HEMOGLOBIN-HEMACUE: Hemoglobin: 9.5 g/dL — ABNORMAL LOW (ref 13.0–17.0)

## 2024-02-25 MED ORDER — EPOETIN ALFA-EPBX 40000 UNIT/ML IJ SOLN
35000.0000 [IU] | Freq: Once | INTRAMUSCULAR | Status: AC
  Administered 2024-02-25: 08:00:00 35000 [IU] via SUBCUTANEOUS

## 2024-02-25 MED ORDER — EPOETIN ALFA-EPBX 40000 UNIT/ML IJ SOLN
INTRAMUSCULAR | Status: AC
  Filled 2024-02-25: qty 1

## 2024-03-10 ENCOUNTER — Encounter (HOSPITAL_COMMUNITY)
Admission: RE | Admit: 2024-03-10 | Discharge: 2024-03-10 | Disposition: A | Discharge status: Home / Self Care | Source: Ambulatory Visit | Attending: Nephrology | Admitting: Nephrology

## 2024-03-10 VITALS — BP 123/87 | HR 61 | Temp 97.4°F | Resp 16

## 2024-03-10 DIAGNOSIS — N185 Chronic kidney disease, stage 5: Secondary | ICD-10-CM | POA: Diagnosis not present

## 2024-03-10 DIAGNOSIS — D631 Anemia in chronic kidney disease: Secondary | ICD-10-CM

## 2024-03-10 LAB — POCT HEMOGLOBIN-HEMACUE: Hemoglobin: 9 g/dL — ABNORMAL LOW (ref 13.0–17.0)

## 2024-03-10 MED ORDER — EPOETIN ALFA-EPBX 40000 UNIT/ML IJ SOLN
35000.0000 [IU] | Freq: Once | INTRAMUSCULAR | Status: AC
  Administered 2024-03-10: 35000 [IU] via SUBCUTANEOUS

## 2024-03-10 MED ORDER — EPOETIN ALFA-EPBX 40000 UNIT/ML IJ SOLN
INTRAMUSCULAR | Status: AC
  Filled 2024-03-10: qty 1

## 2024-03-24 ENCOUNTER — Encounter (HOSPITAL_COMMUNITY)
Admission: RE | Admit: 2024-03-24 | Discharge: 2024-03-24 | Disposition: A | Discharge status: Home / Self Care | Source: Ambulatory Visit | Attending: Nephrology | Admitting: Nephrology

## 2024-03-24 VITALS — BP 139/80 | HR 55 | Temp 97.4°F | Resp 16

## 2024-03-24 DIAGNOSIS — N185 Chronic kidney disease, stage 5: Secondary | ICD-10-CM | POA: Diagnosis present

## 2024-03-24 DIAGNOSIS — D631 Anemia in chronic kidney disease: Secondary | ICD-10-CM | POA: Diagnosis present

## 2024-03-24 LAB — POCT HEMOGLOBIN-HEMACUE: Hemoglobin: 9.5 g/dL — ABNORMAL LOW (ref 13.0–17.0)

## 2024-03-24 LAB — IRON AND TIBC
Iron: 124 ug/dL (ref 45–182)
Saturation Ratios: 51 % — ABNORMAL HIGH (ref 17.9–39.5)
TIBC: 245 ug/dL — ABNORMAL LOW (ref 250–450)
UIBC: 121 ug/dL

## 2024-03-24 LAB — FERRITIN: Ferritin: 728 ng/mL — ABNORMAL HIGH (ref 24–336)

## 2024-03-24 MED ORDER — EPOETIN ALFA-EPBX 40000 UNIT/ML IJ SOLN
35000.0000 [IU] | Freq: Once | INTRAMUSCULAR | Status: AC
  Administered 2024-03-24: 35000 [IU] via SUBCUTANEOUS

## 2024-03-24 MED ORDER — EPOETIN ALFA-EPBX 40000 UNIT/ML IJ SOLN
INTRAMUSCULAR | Status: AC
  Filled 2024-03-24: qty 1

## 2024-04-07 ENCOUNTER — Encounter (HOSPITAL_COMMUNITY)

## 2024-04-09 ENCOUNTER — Telehealth (HOSPITAL_COMMUNITY): Payer: Self-pay

## 2024-04-09 NOTE — Telephone Encounter (Signed)
 Auth Submission: APPROVED Site of care: Site of care: [facility] Payer: [place] Medication & CPT/J Code(s) submitted: Retacrit  Q5106 Diagnosis Code: N18.5/D63.1 Route of submission (phone, fax, portal): phone Phone #[phone] Fax # Auth type: Buy/Bill HB Units/visits requested: 35000 units q14days Reference number: [id number] Approval from: 04/09/24 to 05/20/24    Approval letter has been scanned into patient's media tab.

## 2024-04-11 ENCOUNTER — Encounter (HOSPITAL_COMMUNITY)
Admission: RE | Admit: 2024-04-11 | Discharge: 2024-04-11 | Disposition: A | Source: Ambulatory Visit | Attending: Nephrology

## 2024-04-11 VITALS — BP 128/91 | HR 69 | Temp 97.9°F | Resp 16

## 2024-04-11 DIAGNOSIS — N185 Chronic kidney disease, stage 5: Secondary | ICD-10-CM | POA: Diagnosis not present

## 2024-04-11 DIAGNOSIS — D631 Anemia in chronic kidney disease: Secondary | ICD-10-CM

## 2024-04-11 LAB — POCT HEMOGLOBIN-HEMACUE: Hemoglobin: 8.9 g/dL — ABNORMAL LOW (ref 13.0–17.0)

## 2024-04-11 MED ORDER — EPOETIN ALFA-EPBX 40000 UNIT/ML IJ SOLN
INTRAMUSCULAR | Status: AC
  Filled 2024-04-11: qty 1

## 2024-04-11 MED ORDER — EPOETIN ALFA-EPBX 40000 UNIT/ML IJ SOLN
35000.0000 [IU] | Freq: Once | INTRAMUSCULAR | Status: AC
  Administered 2024-04-11: 35000 [IU] via SUBCUTANEOUS

## 2024-04-14 ENCOUNTER — Encounter (HOSPITAL_COMMUNITY)

## 2024-04-16 ENCOUNTER — Encounter: Payer: Self-pay | Admitting: Physician Assistant

## 2024-04-16 ENCOUNTER — Ambulatory Visit: Admitting: Student

## 2024-04-16 VITALS — BP 124/68 | HR 56 | Ht 72.0 in | Wt 214.4 lb

## 2024-04-16 DIAGNOSIS — R0989 Other specified symptoms and signs involving the circulatory and respiratory systems: Secondary | ICD-10-CM

## 2024-04-16 DIAGNOSIS — I10 Essential (primary) hypertension: Secondary | ICD-10-CM

## 2024-04-16 DIAGNOSIS — I2510 Atherosclerotic heart disease of native coronary artery without angina pectoris: Secondary | ICD-10-CM

## 2024-04-16 DIAGNOSIS — N184 Chronic kidney disease, stage 4 (severe): Secondary | ICD-10-CM

## 2024-04-16 DIAGNOSIS — I25119 Atherosclerotic heart disease of native coronary artery with unspecified angina pectoris: Secondary | ICD-10-CM

## 2024-04-16 DIAGNOSIS — E785 Hyperlipidemia, unspecified: Secondary | ICD-10-CM

## 2024-04-16 LAB — LIPID PANEL
Chol/HDL Ratio: 2.1 ratio (ref 0.0–5.0)
Cholesterol, Total: 117 mg/dL (ref 100–199)
HDL: 55 mg/dL
LDL Chol Calc (NIH): 52 mg/dL (ref 0–99)
Triglycerides: 37 mg/dL (ref 0–149)
VLDL Cholesterol Cal: 10 mg/dL (ref 5–40)

## 2024-04-16 NOTE — Patient Instructions (Signed)
 Medication Instructions:  NO CHANGES *If you need a refill on your cardiac medications before your next appointment, please call your pharmacy*  Lab Work: FASTING LIPID PANEL TODAY If you have labs (blood work) drawn today and your tests are completely normal, you will receive your results only by: MyChart Message (if you have MyChart) OR A paper copy in the mail If you have any lab test that is abnormal or we need to change your treatment, we will call you to review the results.  Testing/Procedures: NO TESTING  Follow-Up: At [facility], you and your health needs are our priority.  As part of our continuing mission to provide you with exceptional heart care, our providers are all part of one team.  This team includes your primary Cardiologist (physician) and Advanced Practice Providers or APPs (Physician Assistants and Nurse Practitioners) who all work together to provide you with the care you need, when you need it.  Your next appointment:   6 month(s)  Provider:   Alm Clay, MD

## 2024-04-17 ENCOUNTER — Ambulatory Visit: Payer: Self-pay | Admitting: Student

## 2024-04-28 ENCOUNTER — Encounter (HOSPITAL_COMMUNITY)
